# Patient Record
Sex: Male | Born: 1935
Health system: Southern US, Community
[De-identification: ages and names within clinical notes are randomized; demographics above are authoritative.]

## PROBLEM LIST (undated history)

## (undated) DIAGNOSIS — F411 Generalized anxiety disorder: Secondary | ICD-10-CM

## (undated) DIAGNOSIS — I4892 Unspecified atrial flutter: Secondary | ICD-10-CM

## (undated) DIAGNOSIS — E785 Hyperlipidemia, unspecified: Secondary | ICD-10-CM

## (undated) DIAGNOSIS — I4891 Unspecified atrial fibrillation: Secondary | ICD-10-CM

## (undated) DIAGNOSIS — N400 Enlarged prostate without lower urinary tract symptoms: Secondary | ICD-10-CM

## (undated) DIAGNOSIS — I499 Cardiac arrhythmia, unspecified: Secondary | ICD-10-CM

## (undated) DIAGNOSIS — I951 Orthostatic hypotension: Secondary | ICD-10-CM

## (undated) DIAGNOSIS — F09 Unspecified mental disorder due to known physiological condition: Secondary | ICD-10-CM

## (undated) DIAGNOSIS — R55 Syncope and collapse: Secondary | ICD-10-CM

## (undated) DIAGNOSIS — I2510 Atherosclerotic heart disease of native coronary artery without angina pectoris: Secondary | ICD-10-CM

## (undated) DIAGNOSIS — R42 Dizziness and giddiness: Secondary | ICD-10-CM

## (undated) DIAGNOSIS — IMO0002 Reserved for concepts with insufficient information to code with codable children: Secondary | ICD-10-CM

## (undated) HISTORY — PX: APPENDECTOMY: SHX54

## (undated) HISTORY — DX: Cardiac arrhythmia, unspecified: I49.9

## (undated) HISTORY — DX: Unspecified mental disorder due to known physiological condition: F09

## (undated) HISTORY — DX: Syncope and collapse: R55

## (undated) HISTORY — DX: Hyperlipidemia, unspecified: E78.5

## (undated) HISTORY — DX: Benign prostatic hyperplasia without lower urinary tract symptoms: N40.0

## (undated) HISTORY — DX: Unspecified atrial flutter: I48.92

## (undated) HISTORY — DX: Dizziness and giddiness: R42

## (undated) HISTORY — DX: Generalized anxiety disorder: F41.1

## (undated) HISTORY — DX: Unspecified atrial fibrillation: I48.91

## (undated) HISTORY — DX: Atherosclerotic heart disease of native coronary artery without angina pectoris: I25.10

## (undated) HISTORY — DX: Orthostatic hypotension: I95.1

## (undated) HISTORY — PX: OTHER SURGICAL HISTORY: SHX169

## (undated) HISTORY — PX: PULSE GENERATOR IMPLANT: SHX5370

## (undated) HISTORY — DX: Reserved for concepts with insufficient information to code with codable children: IMO0002

---

## 1997-06-17 ENCOUNTER — Ambulatory Visit (HOSPITAL_COMMUNITY): Admission: RE | Admit: 1997-06-17 | Discharge: 1997-06-18 | Payer: Self-pay | Admitting: Cardiovascular Disease

## 1997-06-17 HISTORY — PX: CARDIAC CATHETERIZATION: SHX172

## 1997-06-18 ENCOUNTER — Observation Stay (HOSPITAL_COMMUNITY): Admission: EM | Admit: 1997-06-18 | Discharge: 1997-06-19 | Payer: Self-pay | Admitting: Emergency Medicine

## 1999-03-18 ENCOUNTER — Inpatient Hospital Stay (HOSPITAL_COMMUNITY): Admission: EM | Admit: 1999-03-18 | Discharge: 1999-03-21 | Payer: Self-pay | Admitting: Emergency Medicine

## 1999-03-19 ENCOUNTER — Encounter: Payer: Self-pay | Admitting: Cardiology

## 1999-03-19 ENCOUNTER — Encounter: Payer: Self-pay | Admitting: Cardiovascular Disease

## 1999-03-20 ENCOUNTER — Encounter: Payer: Self-pay | Admitting: Cardiovascular Disease

## 1999-04-12 ENCOUNTER — Emergency Department (HOSPITAL_COMMUNITY): Admission: EM | Admit: 1999-04-12 | Discharge: 1999-04-12 | Payer: Self-pay | Admitting: Emergency Medicine

## 1999-04-12 ENCOUNTER — Encounter: Payer: Self-pay | Admitting: Emergency Medicine

## 1999-04-29 ENCOUNTER — Encounter: Payer: Self-pay | Admitting: Cardiology

## 1999-07-30 ENCOUNTER — Encounter: Payer: Self-pay | Admitting: Cardiology

## 2001-03-13 HISTORY — PX: OTHER SURGICAL HISTORY: SHX169

## 2002-08-12 ENCOUNTER — Encounter: Payer: Self-pay | Admitting: Cardiology

## 2004-08-13 ENCOUNTER — Encounter: Admission: RE | Admit: 2004-08-13 | Discharge: 2004-08-13 | Payer: Self-pay | Admitting: Orthopedic Surgery

## 2004-09-11 ENCOUNTER — Encounter: Admission: RE | Admit: 2004-09-11 | Discharge: 2004-09-11 | Payer: Self-pay | Admitting: Orthopedic Surgery

## 2004-09-15 ENCOUNTER — Encounter: Admission: RE | Admit: 2004-09-15 | Discharge: 2004-09-15 | Payer: Self-pay | Admitting: Orthopedic Surgery

## 2004-10-06 ENCOUNTER — Encounter: Admission: RE | Admit: 2004-10-06 | Discharge: 2004-10-06 | Payer: Self-pay | Admitting: Orthopedic Surgery

## 2005-08-30 ENCOUNTER — Ambulatory Visit (HOSPITAL_COMMUNITY): Admission: RE | Admit: 2005-08-30 | Discharge: 2005-08-30 | Payer: Self-pay | Admitting: *Deleted

## 2005-08-30 ENCOUNTER — Encounter: Payer: Self-pay | Admitting: Cardiology

## 2006-05-31 ENCOUNTER — Encounter: Admission: RE | Admit: 2006-05-31 | Discharge: 2006-05-31 | Payer: Self-pay | Admitting: Orthopedic Surgery

## 2006-06-16 ENCOUNTER — Encounter: Admission: RE | Admit: 2006-06-16 | Discharge: 2006-06-16 | Payer: Self-pay | Admitting: Orthopedic Surgery

## 2006-06-30 ENCOUNTER — Encounter: Admission: RE | Admit: 2006-06-30 | Discharge: 2006-06-30 | Payer: Self-pay | Admitting: Orthopedic Surgery

## 2006-08-03 ENCOUNTER — Encounter: Admission: RE | Admit: 2006-08-03 | Discharge: 2006-08-03 | Payer: Self-pay | Admitting: Orthopedic Surgery

## 2007-02-22 ENCOUNTER — Encounter: Admission: RE | Admit: 2007-02-22 | Discharge: 2007-02-22 | Payer: Self-pay | Admitting: Neurology

## 2007-08-25 ENCOUNTER — Emergency Department (HOSPITAL_COMMUNITY): Admission: EM | Admit: 2007-08-25 | Discharge: 2007-08-25 | Payer: Self-pay | Admitting: Emergency Medicine

## 2008-12-23 ENCOUNTER — Emergency Department (HOSPITAL_COMMUNITY): Admission: EM | Admit: 2008-12-23 | Discharge: 2008-12-23 | Payer: Self-pay | Admitting: Emergency Medicine

## 2009-03-18 HISTORY — PX: OTHER SURGICAL HISTORY: SHX169

## 2009-04-03 ENCOUNTER — Encounter: Payer: Self-pay | Admitting: Cardiology

## 2009-04-03 ENCOUNTER — Ambulatory Visit (HOSPITAL_COMMUNITY): Admission: RE | Admit: 2009-04-03 | Discharge: 2009-04-03 | Payer: Self-pay | Admitting: Ophthalmology

## 2009-05-20 ENCOUNTER — Encounter: Payer: Self-pay | Admitting: Cardiology

## 2009-05-22 ENCOUNTER — Encounter: Payer: Self-pay | Admitting: Cardiology

## 2009-05-23 ENCOUNTER — Telehealth (INDEPENDENT_AMBULATORY_CARE_PROVIDER_SITE_OTHER): Payer: Self-pay | Admitting: *Deleted

## 2009-05-23 ENCOUNTER — Encounter: Payer: Self-pay | Admitting: Cardiology

## 2009-06-03 ENCOUNTER — Encounter: Payer: Self-pay | Admitting: Cardiology

## 2009-06-18 ENCOUNTER — Ambulatory Visit: Payer: Self-pay | Admitting: Cardiology

## 2009-06-18 DIAGNOSIS — E039 Hypothyroidism, unspecified: Secondary | ICD-10-CM | POA: Insufficient documentation

## 2009-06-18 DIAGNOSIS — I951 Orthostatic hypotension: Secondary | ICD-10-CM | POA: Insufficient documentation

## 2009-06-18 DIAGNOSIS — R55 Syncope and collapse: Secondary | ICD-10-CM

## 2009-06-18 DIAGNOSIS — I4891 Unspecified atrial fibrillation: Secondary | ICD-10-CM

## 2009-06-18 DIAGNOSIS — I10 Essential (primary) hypertension: Secondary | ICD-10-CM

## 2009-06-18 DIAGNOSIS — Z950 Presence of cardiac pacemaker: Secondary | ICD-10-CM | POA: Insufficient documentation

## 2009-06-19 ENCOUNTER — Encounter: Payer: Self-pay | Admitting: Cardiology

## 2009-06-23 ENCOUNTER — Encounter: Payer: Self-pay | Admitting: Cardiology

## 2009-06-25 ENCOUNTER — Encounter (INDEPENDENT_AMBULATORY_CARE_PROVIDER_SITE_OTHER): Payer: Self-pay | Admitting: *Deleted

## 2009-07-03 ENCOUNTER — Encounter (INDEPENDENT_AMBULATORY_CARE_PROVIDER_SITE_OTHER): Payer: Self-pay | Admitting: *Deleted

## 2009-07-24 ENCOUNTER — Ambulatory Visit: Payer: Self-pay | Admitting: Cardiology

## 2009-09-01 ENCOUNTER — Encounter: Payer: Self-pay | Admitting: Cardiology

## 2010-02-03 NOTE — Letter (Signed)
Summary: Engineer, materials at [facility]  [address]   [place], [place] [address]   Phone: [phone]  Fax: [phone]        July 03, 2009 MRN: [id number]   Reginald [name] [address] [place], [place]  [address]   Dear [name],  Your test ordered by Selena Batten has been reviewed by your physician (or physician assistant) and was found to be normal or stable. Your physician (or physician assistant) felt no changes were needed at this time.  ____ Echocardiogram  ____ Cardiac Stress Test  __X__ Lab Work - normal urine laboratory values  ____ Peripheral vascular study of arms, legs or neck  ____ CT scan or X-ray  ____ Lung or Breathing test  ____ Other:   Thank you.   Hoover Brunette, LPN    Duane Boston, M.D., F.A.C.C. Thressa Sheller, M.D., F.A.C.C. Oneal Grout, M.D., F.A.C.C. Cheree Ditto, M.D., F.A.C.C. Daiva Nakayama, M.D., F.A.C.C. Kenney Houseman, M.D., F.A.C.C. Jeanne Ivan, PA-C

## 2010-02-03 NOTE — Letter (Signed)
Summary: Appointment- Rescheduled  [facility] at [facility] [address]   [place], [place] [address]   Phone: [phone]  Fax: [phone]     Jun 03, 2009 MRN: [id number]     Reginald [name] [address] [place], [place]  [address]     Dear Mr. DOOLITTLE,   Due to a change in our office schedule, your appointment on   June 17, 2009 at 2 :30 pm must be changed.    Your new appointment will be June 18, 2009 at 1:30pm.   We look forward to participating in your health care needs.        Sincerely,  Glass blower/designer

## 2010-02-03 NOTE — Assessment & Plan Note (Signed)
Summary: [facility]-POST [facility] 5/20   Visit Type:  establisment Primary Provider:  Hasanaj   History of Present Illness: the patient is an 77 year old male with a history of prior pacemaker implantation. The patient presents for evaluation of syncope. Currently around Easter the patient had an episode of syncope. He was evaluated in the emergency room. He was told he was orthostatic. Apparently he passed out in church after prolonged standing. Couple days later he had to go back to the emergency room because of another episode of syncope. A ventilation/perfusion scan was done which showed no evidence for pulmonary emboli. On Thursday the patient another episode of passing out after prolonged standing. Subsequently he went to see a cardiologist at [facility]. Reportedly his pacemaker was checked at that time and no pacemaker malfunction was detected. I presume also that the patient had no recurrence of rapid atrial fibrillation. He is on amiodarone and Coumadin. An EKG with and without magnet confirms normal pacemaker function in the office.  According to patient's wife the patient comes dizzy only after a prolonged period of standing. Orthostatics in the office after several minutes of standing showed no definite orthostatic hypotension. However when the patient coughs or sneezes or when he stands for long time walking church he becomes very dizzy and weak and he seemed to be the circumstances I syncope occurs. There is associated shortness of breath as well as nausea during the episodes. The patient also has been feeling off balance. His wife also reports that he has significant mood swings. His memory is very poor and indeed even short-term memory is affected as the patient could not recall that he mentioned his pacemaker couple of times to me. Her port reported the patient becomes angry and irritable very easily.  Over the last several months the patient has been started on Midrin. Wife  also reports that in the past his been on Florinef. He scheduled to see a neurologist at [facility] next month.  Preventive Screening-Counseling & Management  Alcohol-Tobacco     Smoking Status: quit     Year Quit: 1998  Current Medications (verified): 1)  Nitrostat 0.4 Mg Subl (Nitroglycerin) .... Place Under Tongue For Chest Pain For Up To 3 Times Every . Proceed To Ed If Pain Persisit 2)  Coumadin 5 Mg Tabs (Warfarin Sodium) .... Use As Directed Per Anticoagulative Clinic Was Alanda Amass 3)  Amiodarone Hcl 200 Mg Tabs (Amiodarone Hcl) .... Take 1 Tablet By Mouth Once A Day 4)  Levothyroxine Sodium 100 Mcg Tabs (Levothyroxine Sodium) .... Take 1 Tablet By Mouth Once A Day 5)  Uroxatral 10 Mg Xr24h-Tab (Alfuzosin Hcl) .... Take 1 Tablet By Mouth Once A Day 6)  Crestor 10 Mg Tabs (Rosuvastatin Calcium) .... Take 1 Tablet By Mouth Once A Day 7)  Buspirone Hcl 5 Mg Tabs (Buspirone Hcl) .... Take 1 Tablet By Mouth Two Times A Day 8)  Finasteride 5 Mg Tabs (Finasteride) .... Take 1 Tablet By Mouth Once A Day 9)  Meclizine Hcl 25 Mg Tabs (Meclizine Hcl) .... Take 1 Tablet By Mouth Three Times A Day As Needed 10)  Midodrine Hcl 5 Mg Tabs (Midodrine Hcl) .... Take 1 Tablet By Mouth Two Times A Day 11)  B-100 Complex  Tabs (Vitamins-Lipotropics) .... Take 1 Tablet By Mouth Once A Day 12)  Dulcolax 5 Mg Tbec (Bisacodyl) .... Take 1 Tablet By Mouth Once A Day As Needed Alternate With Colace 13)  Colace 100 Mg Caps (Docusate Sodium) .Marland KitchenMarland KitchenMarland Kitchen  Take 2 Tablet By Mouth Once A Day As Needed Alternate With Dulcolax  Allergies (verified): No Known Drug Allergies  Comments:  Nurse/Medical Assistant: The patient's medication list and allergies were reviewed with the patient and were updated in the Medication and Allergy Lists.  Past History:  Past Medical History: Last updated: 06/18/2009 Atrial Fibrillation Hyperlipidemia Syncope Vertigo BPH Anxiety neurosis  Past Surgical History: Last  updated: 06/18/2009 Appendectomy  Ex-plant of a Medtronic Kappa [id number] generator, serial number      [id number].  Atrial and ventricular lead interrogation.  Implant of a Medtronic EnRhythm generator, model number [id number], serial      number [id number].  Family History: Last updated: 06/18/2009 Father: CHF Mother: had breast cancer Sister: had breast cancer  Social History: Last updated: 06/18/2009 Retired  Married  Alcohol Use - no Drug Use - no  Risk Factors: Smoking Status: quit (06/18/2009)  Social History: Smoking Status:  quit  Review of Systems       The patient complains of shortness of breath, nausea, muscle weakness, fainting, and dizziness.  The patient denies fatigue, malaise, fever, weight gain/loss, vision loss, decreased hearing, hoarseness, chest pain, palpitations, prolonged cough, wheezing, sleep apnea, coughing up blood, abdominal pain, blood in stool, vomiting, diarrhea, heartburn, incontinence, blood in urine, joint pain, leg swelling, rash, skin lesions, headache, depression, anxiety, enlarged lymph nodes, easy bruising or bleeding, and environmental allergies.    Vital Signs:  Patient profile:   77 year old male Height:      68 inches Weight:      168 pounds BMI:     25.64 Pulse rate:   73 / minute Pulse (ortho):   73 / minute BP sitting:   101 / 67  (left arm) BP standing:   91 / 57 Cuff size:   regular  Vitals Entered By: Carlye Grippe (June 18, 2009 1:29 PM)  Nutrition Counseling: Patient's BMI is greater than 25 and therefore counseled on weight management options.  Serial Vital Signs/Assessments:  Time      Position  BP       Pulse  Resp  Temp     By 2:15 PM   Lying LA  103/69   78                    Lydia Anderson 2:15 PM   Sitting   98/64    70                    Carlye Grippe 2:15 PM   Standing  91/57    73                    Carlye Grippe  Comments: 2 min Standing BP 102/67 HR 71 5 min Standing BP 99/65 HR 70 By: Cyril Loosen, RN, BSN    Physical Exam  Additional Exam:  General: Well-developed, well-nourished in no distress head: Normocephalic and atraumatic eyes PERRLA/EOMI intact, conjunctiva and lids normal nose: No deformity or lesions mouth normal dentition, normal posterior pharynx neck: Supple, no JVD.  No masses, thyromegaly or abnormal cervical nodes lungs: Normal breath sounds bilaterally without wheezing.  Normal percussion heart: regular rate and rhythm with normal S1 and S2, no S3 or S4.  PMI is normal.  No pathological murmurs abdomen: Normal bowel sounds, abdomen is soft and nontender without masses, organomegaly or hernias noted.  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 He has multiple neurological complaints are consistent with a syndrome. He has some Parkinsonian like features. We will check an a.m. cortisol, aldosterone and norepinephrine level. I do not think we need to check a dopamine beta hydroxylase level. 24 hour urine for catecholamines will be tested. Brain imaging can be deferred to neurology and the patient is not a candidate for MRI anyway because of his pacemaker. I asked the patient to increase his Midrin to 10 mg p.o. b.i.d. and after all the blood work and urine samples have been obtained he can start on Florinef 0.1 mg p.o. q. daily. Orders: T-Basic Metabolic Panel [phone]) T- * Misc. Laboratory test  [phone]) T- * Misc. Laboratory test [phone]) T- * Misc. Laboratory test [phone]) T- * Misc. Laboratory test [phone])  Problem # 2:  ATRIAL FIBRILLATION (ICD-427.31) I doubt the patient hasrecurrence of atrial fibrillation.he will be continued on amiodarone and Coumadin. However a TSH level will be obtained. It is possible that amiodarone is contributing to gait instability, but we will wait for lab results before making a decision to discontinue this. We'll also wait for formal neurology evaluation. TSH does need to be checked as hypothyroidism could be contributing to the patient's memory loss. The following medications were removed from the medication list:    Adult Aspirin Ec Low Strength 81 Mg Tbec (Aspirin) .Marland Kitchen... Take 1 tablet by mouth once a day His updated medication list for this problem includes:    Coumadin 5 Mg Tabs (Warfarin sodium) ..... Use as directed per anticoagulative clinic was weintraub    Amiodarone Hcl 200 Mg Tabs (Amiodarone hcl) .Marland Kitchen... Take 1 tablet by mouth once a day  Orders: EKG w/ Interpretation ([address]) T-Basic Metabolic Panel ([id number]) T- * Misc. Laboratory test [id number]) T- * Misc. Laboratory test [phone]) T- * Misc. Laboratory test [phone]) T- * Misc. Laboratory test [phone]) T-TSH [phone])  The following medications were removed from the medication list:    Adult Aspirin Ec Low Strength 81 Mg Tbec (Aspirin) .Marland Kitchen... Take 1 tablet by mouth once a day His updated medication list for this problem includes:    Coumadin 5 Mg Tabs (Warfarin sodium) ..... Use as directed per anticoagulative clinic was weintraub    Amiodarone Hcl 200 Mg Tabs (Amiodarone hcl) .Marland Kitchen... Take 1 tablet by mouth once a day  Problem # 3:  CARDIAC PACEMAKER IN SITU (ICD-V45.01) no apparent malfunction of pacemaker. The pacemaker was interrogated the time since January. His EKG with and without magnet also shows normal pacemaker function  Patient Instructions: 1)  Increase Midodrine to 10mg  by  mouth two times a day. You may take 2 of your 5 mg tablets in the am and 2 in the pm until gone. Then get new prescription filled for 10mg  tablets and take 1 tablet two times a day. 2)  Your physician recommends that you go to the [facility] for lab work. Do not eat or drink after midnight. This includes a 24 hour urine. Do not eat or drink after midnight.  3)  Start Florinef 0.1mg  by mouth once daily. DO NOT START THIS MEDICATION UNTIL YOU HAVE LAB WORK AND 24 HOUR URINE DONE. 4)  FOLLOW UP WITH DR. DE GENT ON THURSDAY, July 24, 2009 AT 2PM. Prescriptions: FLUDROCORTISONE ACETATE 0.1 MG TABS (FLUDROCORTISONE ACETATE) Take 1 tablet by mouth once a day  #30 x 3   Entered by:   Cyril Loosen, RN, BSN   Authorized by:   Lewayne Bunting, MD, [facility]   Signed by:   Cyril Loosen, RN, BSN on 06/18/2009   Method used:   Electronically to        [facility]* (retail)       [address]       [place], [place]  [address]       Ph: [phone]       Fax: [phone]   RxID:   [id number]   Handout requested. MIDODRINE HCL 10 MG TABS (MIDODRINE HCL) Take 1 tablet by mouth two times a day  #60 x 6   Entered by:   Cyril Loosen, RN, BSN   Authorized by:   Lewayne Bunting, MD, [facility]   Signed by:   Cyril Loosen, RN, BSN on 06/18/2009   Method used:   Electronically to        [facility]* (retail)       [address]       [place], [place]  [address]       Ph: [phone]       Fax: [phone]   RxID:   [id number]

## 2010-02-03 NOTE — Letter (Signed)
Summary: Discharge Summary  Discharge Summary   Imported By: Dorise Hiss 06/18/2009 09:49:07  _____________________________________________________________________  External Attachment:    Type:   Image     Comment:   External Document

## 2010-02-03 NOTE — Progress Notes (Signed)
Summary: syncope  Phone Note Other Incoming   Caller: wife  Summary of Call: States pt. just came home from hospiital this a.m.  Thought that Dr. Diona Browner was going to come back to see him, but didn't so they went ahead and came home.  Discharged by Dr. Olena Leatherwood.  C/O still having issues of feeling like he is going to pass out.  States BP is 89/42  HR 70.  States his bp has been doing this, going up and down.  Also, advised her that our MD is still rounding on pt's over there and that it probably would have been good if they had stayed and waited.  Advised her that if he is still feeling like this, then he should come back to hosp. for further evaluation.  Wife verbalized understanding.   Initial call taken by: Hoover Brunette, LPN,  May 23, 2009 3:21 PM

## 2010-02-03 NOTE — Op Note (Signed)
Summary: Operative Report/ PACEMAKER IMPLANT  Operative Report/ PACEMAKER IMPLANT   Imported By: Dorise Hiss 06/18/2009 09:56:40  _____________________________________________________________________  External Attachment:    Type:   Image     Comment:   External Document

## 2010-02-03 NOTE — Letter (Signed)
Summary: External Correspondence/ [facility]  External Correspondence/ [facility]   Imported By: Dorise Hiss 09/18/2009 17:05:39  _____________________________________________________________________  External Attachment:    Type:   Image     Comment:   External Document

## 2010-02-03 NOTE — Assessment & Plan Note (Signed)
Summary: 1 MON F/U PER 6/15 OV-JM   Visit Type:  Follow-up Primary Provider:  Hasanaj   History of Present Illness: the patient is an 77 year old male who previously presented for evaluation of syncope.he has history of proximal atrial fibrillation and is on amiodarone therapy, is also status post pacemaker implantation and is maintaining normal sinus rhythm.. The patient was orthostatic in the office last visit. He demonstrated some Parkinsonian features are those concerned about Shy-Drager syndrome. I started him on Florinef and Midrin. He has had no further syncopal episodes. He does have occasional vertigo which is quite different and is resolved with meclizine. He has a formal evaluation scheduled with neurology. The patient also complains of memory loss. He has been evaluated with 24-hour urine cortisol, aldosterone and norepinephrine and epinephrine levels. Studies were within normal limits.X.  The patient does demonstrate some paranoid behavior in particular towards the government. He has some compulsive traits like checking behavior. He feels somewhat unsteady on his feet at times.  Preventive Screening-Counseling & Management  Alcohol-Tobacco     Smoking Status: quit     Year Quit: 1996  Current Medications (verified): 1)  Nitrostat 0.4 Mg Subl (Nitroglycerin) .... Place Under Tongue For Chest Pain For Up To 3 Times Every . Proceed To Ed If Pain Persisit 2)  Coumadin 5 Mg Tabs (Warfarin Sodium) .... Use As Directed Per Anticoagulative Clinic Was Alanda Amass 3)  Amiodarone Hcl 200 Mg Tabs (Amiodarone Hcl) .... Take 1/2 Tab (100mg ) Daily 4)  Levothyroxine Sodium 100 Mcg Tabs (Levothyroxine Sodium) .... Take 1 Tablet By Mouth Once A Day 5)  Uroxatral 10 Mg Xr24h-Tab (Alfuzosin Hcl) .... Take 1 Tablet By Mouth Once A Day 6)  Crestor 10 Mg Tabs (Rosuvastatin Calcium) .... Take 1 Tablet By Mouth Once A Day 7)  Buspirone Hcl 5 Mg Tabs (Buspirone Hcl) .... Take 1 Tablet By Mouth Two  Times A Day 8)  Finasteride 5 Mg Tabs (Finasteride) .... Take 1 Tablet By Mouth Once A Day 9)  Meclizine Hcl 25 Mg Tabs (Meclizine Hcl) .... Take 1 Tablet By Mouth Three Times A Day As Needed 10)  Midodrine Hcl 10 Mg Tabs (Midodrine Hcl) .... Take 1 Tablet By Mouth Two Times A Day 11)  B-100 Complex  Tabs (Vitamins-Lipotropics) .... Take 1 Tablet By Mouth Once A Day 12)  Dulcolax 5 Mg Tbec (Bisacodyl) .... Take 1 Tablet By Mouth Once A Day As Needed Alternate With Colace 13)  Colace 100 Mg Caps (Docusate Sodium) .... Take 2 Tablet By Mouth Once A Day As Needed Alternate With Dulcolax 14)  Fludrocortisone Acetate 0.1 Mg Tabs (Fludrocortisone Acetate) .... Take 1 Tablet By Mouth Once A Day  Allergies (verified): No Known Drug Allergies  Comments:  Nurse/Medical Assistant: The patient's medication list and allergies were reviewed with the patient and were updated in the Medication and Allergy Lists.  Past History:  Past Medical History: Last updated: 06/18/2009 Atrial Fibrillation Hyperlipidemia Syncope Vertigo BPH Anxiety neurosis  Past Surgical History: Last updated: 06/18/2009 Appendectomy  Ex-plant of a Medtronic Kappa [id number] generator, serial number      [id number].  Atrial and ventricular lead interrogation.  Implant of a Medtronic EnRhythm generator, model number [id number], serial      number [id number].  Family History: Last updated: 06/18/2009 Father: CHF Mother: had breast cancer Sister: had breast cancer  Social History: Last updated: 06/18/2009 Retired  Married  Alcohol Use - no Drug Use - no  Risk Factors: Smoking Status: quit (07/24/2009)  Review of Systems  The patient denies fatigue, malaise, fever, weight gain/loss, vision loss, decreased hearing, hoarseness, chest pain, palpitations, shortness of breath, prolonged cough, wheezing, sleep apnea, coughing up blood, abdominal pain, blood in stool, nausea, vomiting, diarrhea, heartburn, incontinence,  blood in urine, muscle weakness, joint pain, leg swelling, rash, skin lesions, headache, fainting, dizziness, depression, anxiety, enlarged lymph nodes, easy bruising or bleeding, and environmental allergies.    Vital Signs:  Patient profile:   77 year old male Height:      68 inches Weight:      167 pounds Pulse rate:   75 / minute BP sitting:   111 / 75  (left arm) Cuff size:   regular  Vitals Entered By: Carlye Grippe (July 24, 2009 2:17 PM)  Physical Exam  Additional Exam:  General: Well-developed, well-nourished in no distress head: Normocephalic and atraumatic eyes PERRLA/EOMI intact, conjunctiva and lids normal nose: No deformity or lesions mouth normal dentition, normal posterior pharynx neck: Supple, no JVD.  No masses, thyromegaly or abnormal cervical nodes lungs: Normal breath sounds bilaterally without wheezing.  Normal percussion heart: regular rate and rhythm with normal S1 and S2, no S3 or S4.  PMI is normal.  No pathological murmurs abdomen: Normal bowel sounds, abdomen is soft and nontender without masses, organomegaly or hernias noted.  No hepatosplenomegaly musculoskeletal: Back normal, normal gait muscle strength and tone normal pulsus: Pulse is normal in all 4 extremities Extremities: No peripheral pitting edema neurologic: Alert and oriented x 3 skin: Intact without lesions or rashes cervical nodes: No significant adenopathy psychologic: Normal affect    Impression & Recommendations:  Problem # 1:  CARDIAC PACEMAKER IN SITU (ICD-V45.01)  Problem # 2:  ATRIAL FIBRILLATION (ICD-427.31) decrease amiodarone to 100 mg p.o. q. day His updated medication list for this problem includes:    Coumadin 5 Mg Tabs (Warfarin sodium) ..... Use as directed per anticoagulative clinic was weintraub    Amiodarone Hcl 200 Mg Tabs (Amiodarone hcl) .Marland Kitchen... Take 1/2 tab (100mg ) daily  Orders: EKG w/ Interpretation (93[address])  Problem # 3:  SYNCOPE AND COLLAPSE  (ICD-780.2) much improved with Florinef and Midrin. The patient will have a formal neurologic evaluation in particularly evaluation for Shy-Drager syndrome. His updated medication list for this problem includes:    Nitrostat 0.4 Mg Subl (Nitroglycerin) .Marland Kitchen... Place under tongue for chest pain for up to 3 times every . proceed to ed if pain persisit    Coumadin 5 Mg Tabs (Warfarin sodium) ..... Use as directed per anticoagulative clinic was weintraub    Amiodarone Hcl 200 Mg Tabs (Amiodarone hcl) .Marland Kitchen... Take 1/2 tab (100mg ) daily  Patient Instructions: 1)  Decrease Amiodarone to 100mg  daily  2)  Follow up in  6 months

## 2010-02-03 NOTE — Letter (Signed)
Summary: Engineer, materials at [facility]  [address]   [place], [place] [address]   Phone: [phone]  Fax: [phone]        June 25, 2009 MRN: [id number]   Reginald [name] [address] [place], [place]  [address]   Dear [name],  Your test ordered by Selena Batten has been reviewed by your physician (or physician assistant) and was found to be normal or stable. Your physician (or physician assistant) felt no changes were needed at this time.  ____ Echocardiogram  ____ Cardiac Stress Test  _X__ Lab Work  ____ Peripheral vascular study of arms, legs or neck  ____ CT scan or X-ray  ____ Lung or Breathing test  ____ Other:   Thank you.   Hoover Brunette, LPN    Duane Boston, M.D., F.A.C.C. Thressa Sheller, M.D., F.A.C.C. Oneal Grout, M.D., F.A.C.C. Cheree Ditto, M.D., F.A.C.C. Daiva Nakayama, M.D., F.A.C.C. Kenney Houseman, M.D., F.A.C.C. Jeanne Ivan, PA-C

## 2010-02-03 NOTE — Op Note (Signed)
Summary: Operative Report  Operative Report   Imported By: Dorise Hiss 06/18/2009 09:47:24  _____________________________________________________________________  External Attachment:    Type:   Image     Comment:   External Document

## 2010-03-30 LAB — BASIC METABOLIC PANEL
Creatinine, Ser: 1.25 mg/dL (ref 0.4–1.5)
GFR calc Af Amer: >60 mL/min (ref >60)

## 2010-03-30 LAB — HEMOGLOBIN AND HEMATOCRIT, BLOOD: HCT: 31.3 % — ABNORMAL LOW (ref 39.0–52.0)

## 2010-04-03 ENCOUNTER — Ambulatory Visit (INDEPENDENT_AMBULATORY_CARE_PROVIDER_SITE_OTHER): Payer: Medicare Other | Admitting: Urology

## 2010-04-03 DIAGNOSIS — R310 Gross hematuria: Secondary | ICD-10-CM

## 2010-04-03 DIAGNOSIS — N411 Chronic prostatitis: Secondary | ICD-10-CM

## 2010-04-03 DIAGNOSIS — N138 Other obstructive and reflux uropathy: Secondary | ICD-10-CM

## 2010-04-06 LAB — COMPREHENSIVE METABOLIC PANEL
ALT: 25 U/L (ref 0–53)
AST: 35 U/L (ref 0–37)
Alkaline Phosphatase: 54 U/L (ref 39–117)
BUN: 12 mg/dL (ref 6–23)
CO2: 26 mEq/L (ref 19–32)
Chloride: 105 mEq/L (ref 96–112)
GFR calc non Af Amer: 43 mL/min — ABNORMAL LOW (ref >60)
Glucose, Bld: 95 mg/dL (ref 70–99)

## 2010-04-06 LAB — DIFFERENTIAL
Eosinophils Absolute: 0 10*3/uL (ref 0.0–0.7)
Eosinophils Relative: 1 % (ref 0–5)
Monocytes Absolute: 0.3 10*3/uL (ref 0.1–1.0)
Monocytes Relative: 7 % (ref 3–12)
Neutrophils Relative %: 58 % (ref 43–77)

## 2010-04-06 LAB — APTT: aPTT: 44 seconds — ABNORMAL HIGH (ref 24–37)

## 2010-04-06 LAB — CBC
Hemoglobin: 10.9 g/dL — ABNORMAL LOW (ref 13.0–17.0)
MCHC: 35.3 g/dL (ref 30.0–36.0)
MCV: 89.9 fL (ref 78.0–100.0)
WBC: 5.1 10*3/uL (ref 4.0–10.5)

## 2010-04-06 LAB — URINALYSIS, ROUTINE W REFLEX MICROSCOPIC
Bilirubin Urine: NEGATIVE
Glucose, UA: NEGATIVE mg/dL
Hgb urine dipstick: NEGATIVE
Ketones, ur: NEGATIVE mg/dL
Nitrite: NEGATIVE
Urobilinogen, UA: 2 mg/dL — ABNORMAL HIGH (ref 0.0–1.0)

## 2010-04-06 LAB — URINE CULTURE: Culture: NO GROWTH

## 2010-04-06 LAB — PROTIME-INR
INR: 3.47 — ABNORMAL HIGH (ref 0.00–1.49)
Prothrombin Time: 34.6 seconds — ABNORMAL HIGH (ref 11.6–15.2)

## 2010-04-06 LAB — URINE MICROSCOPIC-ADD ON

## 2010-05-22 NOTE — Procedures (Signed)
[place]. [facility]  Patient:    Reginald [name], Reginald [name]                        MRN: [id number] Proc. Date: 03/19/99 Adm. Date:  [id number] Attending:  Ruta Hinds CC:         Richard A. Alanda Amass, M.D.             The Record Room             The [facility]             Armanda Magic, M.D.             Dr. Camelia Eng; [place] [facility]                           Procedure Report  PROCEDURE:  Implantation of permanent DDDR mode switching, rate responsive, A-V  Universal generator with passive fixation atrial and ventricular electrodes.  PULSE GENERATOR:  Medtronic Cappa KDR 701; SNPGU [id number].  VENTRICULAR ELECTRODE:  Silicone in-line coaxial bipolar Medtronic [id number]; SN: [id number].  ATRIAL ELECTRODE:  Silicone-polyurethane IS1 connector in-line coaxial bipolar Medtronic [id number]; SN: [id number].  IMPLANTING PHYSICIAN:  Richard A. Alanda Amass, M.D.  COMPLICATIONS:  None.  ESTIMATED BLOOD LOSS:  Approximately 30 cc.  ANESTHESIA:  5 mg Valium p.o. premedication, 4 mg Nubain IV in divided doses for sedation, and 1% local Xylocaine.  PREOPERATIVE DIAGNOSES: 1. Recurrent near syncope. 2. Documented sinus arrest with 7 s pauses. 3. Paroxysmal atrial fibrillation documented, sick sinus syndrome. 4. Noncritical coronary artery disease on prior catheterization on June 19, 1997    with normal LV function. 5. Gastroesophageal reflux disease. 6. Hyperlipidemia.  POSTOPERATIVE DIAGNOSES: 1. Recurrent near syncope. 2. Documented sinus arrest with 7 s pauses. 3. Paroxysmal atrial fibrillation documented, sick sinus syndrome. 4. Noncritical coronary artery disease on prior catheterization on June 19, 1997    with normal LV function. 5. Gastroesophageal reflux disease. 6. Hyperlipidemia.  DESCRIPTION OF PROCEDURE:  The patient was brought to the second floor CP lab in a postabsorptive state after premedication with 5 mg of Valium p.o.   Heparin was n hold for greater than eight hours prior to the procedure.  The patient had been on aspirin up until the day of the procedure.  He was premedicated with 5 mg of Valium.  1% Xylocaine was used for local anesthesia.  The left anterior chest was prepped and draped in the usual manner.  1% Xylocaine was used to anesthetize the skin and subcutaneous tissues.  A left infraclavicular curvilinear transverse incision was performed and brought down to the prepectoral fascia using blunt dissection.  Electrocautery to control hemostasis.  A pulse generator pocket was formed using blunt dissection.  The subclavian vein was entered with a single anterior puncture using an 18 thin-walled needle and two #9 peel-away Lamagna introducers were used to introduce the atrial and ventricular leads in tandem along with a stainless steal J-tipped guide wire, which was kept in place until the leads were positioned.  Threshold testing was performed, and then removed.  The ventricular electrode was inserted into the RV apex in stable position.  The atrial electrode into the right atrial appendage.  Confirmed by fluoroscopy and rotational maneuvers and was stable.  The electrodes were secured at the insertion site where the previously placed #1 figure-of-eight silk suture, and was further secured with two interrupted #1  silk sutures around a silicone ______ collar for each electrode.  There was considerable oozing in the pocket, which was controlled with electrocautery, and the pocket was dry at the end of the procedure.  Threshold testing was performed, along with retrograde conduction studies through the implanted electrodes.  The generator conformed to manufacturers specifications n PSA testing with 3.6 V atrial and 4.1 V ventricular output.  The sponge count was correct.  The generator was hooked to the electrodes in the proper sequence, looped behind, and delivered into the pocket.  The  generator was loosely secured to the underlying muscle and fascia with a #1 silk suture to prevent migration.  The subcutaneous tissue was closed with two separate running layers of 2-0 Dexon suture, and the skin was closed with 5-0 subcuticular Dexon suture. Steri-Strips were applied.  Fluoroscopy showed good position of the atrial and ventricular electrodes.  There was no pneumothorax present.  The patient tolerated the procedure well and was transferred to the holding area for postoperative programming in stable condition.  On PSA testing at 10 V output; Unipolar, bipolar, atrial, and ventricle, there as no diaphragmatic or esophageal stimulation.  Ventricular pacing and atrial recording showed there was no retrograde V-A conduction at a pacing rate of 80-90 with retrograde 2:1 block.  There was prolonged ______ with of almost 1000 ms with simple atrial pacing at rates of 70-80.  THRESHOLDS: 1. Atrial unipolar: P = 2.1 mV, resistance 413 ohms, minimal threshold 0.5 V. 2. Unipolar ventricle: R = 8.8 mV, resistance 535 ohms, minimal threshold 0.2 V. 3. Atrial/bipolar: P = 4.9 mV, resistance 509 ohms, minimal threshold 0.6 V. 4. Ventricular/bipolar: R = 8.0 mV, resistance 668 ohms, minimal threshold 0.3 . 5. Magna rated BOL = 85. 6. Magna rated EOL = 65/min (RRT) with reversion of VVI mode.  The patients pacemaker has rate responsive activity pacing, A-V hysteresis, and  mode switching capabilities. DD:  03/19/99 TD:  03/19/99 Job: [id number]

## 2010-05-22 NOTE — Discharge Summary (Signed)
[place]. [facility]  Patient:    Reginald [name], Reginald [name]                        MRN: [id number] Adm. Date:  [id number] Disc. Date: [id number] Attending:  Otilio Connors Iv Dictator:   [facility] [place], [place].A.                           Discharge Summary  ADMISSION DIAGNOSES: 1. Recurrent near syncope. 2. Sick sinus syndrome, paroxysmal atrial fibrillation. 3. Intermittent sinus bradycardia, junctional rhythm, and marked pauses with    ventricular asystole symptomatic. 4. Hyperlipidemia. 5. Noncritical coronary disease on catheterization June 17, 1997, with normal    left ventricular function. 6. Gastroesophageal reflux disease.  DISCHARGE DIAGNOSES: 1. Recurrent near syncope. 2. Sick sinus syndrome, paroxysmal atrial fibrillation. 3. Intermittent sinus bradycardia, junctional rhythm, and marked pauses with    ventricular asystole symptomatic. 4. Hyperlipidemia. 5. Noncritical coronary disease on catheterization June 17, 1997, with normal    left ventricular function. 6. Gastroesophageal reflux disease. 7. Status post permanent transvenous pacemaker implantation on March 19, 1999,    by Richard A. Alanda Amass, M.D. with a DDDR pacer implanted.  HISTORY OF PRESENT ILLNESS:  [name] is a 77 year old male who presented to the office to see Richard A. Alanda Amass, M.D. on March 14. At that time, he had had multiple episodes of presyncope over the last four months. He had approximately 10 episodes. They were all characterized by a warm feeling in his head and neck which progressed to his back and then a feeling of lightheadedness and near syncope. Fortunately, he had not had any falls, auto accidents, or frank syncope. His last episode had been about a week ago. However, on the day of his office visit while with his wife, he developed an episode of presyncope. He had some sweats with nausea with no diaphoresis or vomiting and no chest pain. His wife came to the  office today and accompanied him and asked that he be seen. In the office, he had episodes of short-lived atrial fibrillation followed by sinus pauses up to 7 seconds. He also had episodic junctional rhythm. His basic underlying EKG showed atrial fibrillation with variable ventricular response that was paroxysmal. He also had sinus rhythm with RSR V1, normal QRS, and no significant ST-T change with intermittent junctional rhythm.  At that time, the patient was planned for admission to the hospital for intensive cardiac monitoring. It was felt that he may require temporary pacing if he continued to have long episodes of ventricular asystole that was symptomatic. He would probably be better in atrial fibrillation in that regard. At that time, Richard A. Alanda Amass, M.D. held his beta blockers and planned to check appropriate labs. He planned that the patient would require permanent transvenous pacing and then medical therapy of the sick sinus syndrome. There was no history to suggest a predominantly vasodepressor reaction. It seemed quite clear from available strips that his symptoms were related to his bradycardia and pauses. The procedure and risks of permanent pacemaker implantation were fully explained to the patient and his wife. They were agreeable to proceed with that plan.  HOSPITAL COURSE:  On March 15, [name] underwent permanent transvenous pacemaker implantation by Richard A. Alanda Amass, M.D. Please see his dictated report for further detail. There was implantation of a permanent DDDR mode switching, rate-responsive AV universal generator with ______  fixation, atrial  and ventricular electrodes. The pulse generator was a Medtronic ______ [id number], serial number [id number]. The ventricular electrode is a silicon in-line coaxial bipolar Medtronic [id number]. Serial number is [id number]. The atrial electrode is a silicon polyurethane IS1 connector in-line coaxial bipolar Medtronic  [id number]. Serial number is [id number].  On March 16, [name] stated that he had had some nausea when he was taken to radiology for his chest x-ray that morning. Otherwise, he had no other complaints. His pacer site was clean with no signs of drainage or hematoma and no sign of infection. We would plan to ambulate him throughout the day and plan for discharge home the following morning if stable.  On March 17, [name] pacer site looked good. His exam was essentially benign with no cardiac rub. His lungs were clear. He was pacing appropriately. His post-pacemaker chest x-ray was okay. He was then planned to restart his aspirin on the following Monday and was stable for discharge home at this time. At this time, he was hemodynamically stable with a blood pressure of 116/68, pulse 70, and temperature is 97. Telemetry revealed AV pacing in the 70s.  HOSPITAL CONSULTATIONS:  None.  HOSPITAL PROCEDURES:  Implantation of the permanent transvenous pacemaker on March 19, 1999, by Richard A. Alanda Amass, M.D. This is an implantation of a permanent DDDR mode switching, rate-responsive AV universal generator with ______  fixation atrial and ventricular electrodes.  HOSPITAL LABORATORY DATA:  TSA normal at 1.84. Thyroid reveals TSH 3.643. T3 38.9, T4 5.8. Lipid profile revealed total cholesterol 176, triglyceride 314, HDL 26, LDL 87. Metabolic profile was normal, as well as LFT. CBC was also normal.  Chest x-ray on March 15 shows cardiomegaly with no congestive heart failure infiltrate. Chest x-ray on March 16 shows satisfactory position of left-sided cardiac pacemaker. No pneumothorax or other acute findings.  EKG on March 14 revealed sinus bradycardia, 54 beats per minute.  DISCHARGE MEDICATIONS: 1. Cardizem CD 180 mg once a day. 2. Vitamin E 400 IU once a day. 3. Zocor 10 mg once a day. 4. Nitroglycerin sublingual p.r.n. for chest pain. 5. Prevacid 15 mg once a day. 6. Niaspan 500 mg  once a day. 7. Enteric-coated aspirin 81 mg once a day, to be started on Monday, the 19th.  INSTRUCTIONS:  No driving until follow up with the doctor. No lifting the arm  above the head. No stretching or lifting with that arm. As well, they were to not wash the incision site area for four days, then they may use soap and water but pat dry only. They were to not remove the steri-strips. They are to call the office if there is any severe bruising, bleeding, pain. They were to call the office on Monday at [phone] to set up an appointment to see Richard A. Alanda Amass, M.D. in seven to ten days. DD:  04/29/99 TD:  04/29/99 Job: [id number]

## 2010-05-22 NOTE — Op Note (Addendum)
Reginald [name], COIN                 ACCOUNT NO.:  [id number]   MEDICAL RECORD NO.:  [id number]          PATIENT TYPE:  OIB   LOCATION:  2899                         FACILITY:  [facility]   PHYSICIAN:  Darlin Priestly, MD  DATE OF BIRTH:  May 30, 1924   DATE OF PROCEDURE:  08/30/2005  DATE OF DISCHARGE:                                 OPERATIVE REPORT   PROCEDURES:  1. Ex-plant of a Medtronic Kappa [id number] generator, serial number      [id number].  2. Atrial and ventricular lead interrogation.  3. Implant of a Medtronic EnRhythm generator, model number P1501DR, serial      number [id number].   ATTENDING SURGEON:  Darlin Priestly, MD   COMPLICATIONS:  None.   INDICATIONS:  Mr. Goens is a 77 year old male patient of Dr. Susa Griffins, Dr. Olena Leatherwood, with a history of paroxysmal atrial fibrillation,  orthostatic hypotension, neurocardiogenic syncope, history of non critical  CAD with normal EF in January of 2005, hyperlipidemia, hypothyroidism,  history of dual-chamber pacer implant on March 19, 1999, using a Medtronic  Kappa [id number] generator with passive atrial and ventricular leads.  His  ventricular lead is a Medtronic model number [id number], serial number [id number].  His atrial lead is a Medtronic model number [id number], serial number [id number].  He has recently reverted to ERI and has basically developed pacer syndrome  secondary to his VVI pacing.  He is now referred for generator change out.   DESCRIPTION OF PROCEDURE:  After informed consent, patient was brought to  the cardiac cath lab where the left chest was shaved, prepped and draped in  the usual sterile fashion.  ____ QA MARKER: 143 ____ monit  Then 1% lidocaine was used to anesthetize just inferior to the previous  incision and over his current generator.  Next, an approximately 3-cm  horizontal incision was carried out and hemostasis was obtained with  electrocautery.  Blunt dissection then used to carry this down to the  pacer  capsule.  The capsule was then opened with a scalpel and was freed from the  capsule.  The generator was then delivered from the pocket.  As previously  stated, this was a Medtronic Kappa [id number], serial number [id number].  The  leads were inspected and appeared to be in excellent condition.  The head  screw was then loosened on the atrial lead and the atrial lead was then  removed from the generator.  The atrial lead was model number 4592, serial  number [id number].  This lead was then interrogated.  P waves were measured  at 3.8 millivolts.  Threshold in the atrial lead was 0.4 volts as 0.5  milliseconds.  Impedance was 433 ohms.  Current was 1.1 milliamp.  The  ventricular head screw was then loosened and the ventricular lead was then  removed from the generator and the generator was delivered from the field.  This lead again appeared to be in excellent condition.  The RV lead was  model number [id number], serial number [id number].  This lead was interrogated.  The R waves were measured at 6.6 millivolts.  Threshold in the ventricle was  0.8 volts at 0.5 milliseconds.  Impedance of 553 ohms.  Current was 1.7  milliamps.   The pocket was then copiously irrigated with 1% clindamycin solution.  Single silk suture was then placed in the apex of the pocket.  The leads  were then connected in a serial fashion to Medtronic EnRhythm generator,  model number [id number], serial number [id number].  Head screws were tightened,  and pacing was confirmed.  The generator was then delivered back into the  pocket and the header was secured with silk suture.  The subcutaneous layer  was then closed with running 2-0 Vicryl.  The skin was then closed with  running 4-0 Vicryl.  Steri-Strips were applied.  The patient was returned  back to the recovery room in stable condition.   CONCLUSION:  1. Successful ex-plant of a Medtronic Kappa [id number], serial number      [id number].  2. Atrial and ventricular lead  interrogation.  3. Successful implant of a Medtronic EnRhythm [id number] generator, serial      number [id number].      Darlin Priestly, MD  Electronically Signed     RHM/MEDQ  D:  08/30/2005  T:  08/30/2005  Job:  [id number]   cc:   Gerlene Burdock A. Alanda Amass, M.D.  Jeananne Rama ****

## 2010-07-10 ENCOUNTER — Ambulatory Visit: Payer: Medicare Other | Admitting: Urology

## 2010-07-31 ENCOUNTER — Ambulatory Visit (INDEPENDENT_AMBULATORY_CARE_PROVIDER_SITE_OTHER): Payer: Medicare Other | Admitting: Urology

## 2010-07-31 DIAGNOSIS — R351 Nocturia: Secondary | ICD-10-CM

## 2010-07-31 DIAGNOSIS — N529 Male erectile dysfunction, unspecified: Secondary | ICD-10-CM

## 2010-07-31 DIAGNOSIS — N138 Other obstructive and reflux uropathy: Secondary | ICD-10-CM

## 2010-07-31 DIAGNOSIS — N411 Chronic prostatitis: Secondary | ICD-10-CM

## 2010-10-30 ENCOUNTER — Ambulatory Visit (INDEPENDENT_AMBULATORY_CARE_PROVIDER_SITE_OTHER): Payer: Medicare Other | Admitting: Urology

## 2010-10-30 DIAGNOSIS — R351 Nocturia: Secondary | ICD-10-CM

## 2010-10-30 DIAGNOSIS — N138 Other obstructive and reflux uropathy: Secondary | ICD-10-CM

## 2011-06-11 ENCOUNTER — Ambulatory Visit (INDEPENDENT_AMBULATORY_CARE_PROVIDER_SITE_OTHER): Payer: Medicare Other | Admitting: Urology

## 2011-06-11 DIAGNOSIS — N411 Chronic prostatitis: Secondary | ICD-10-CM

## 2011-06-11 DIAGNOSIS — N138 Other obstructive and reflux uropathy: Secondary | ICD-10-CM

## 2011-06-11 DIAGNOSIS — R351 Nocturia: Secondary | ICD-10-CM

## 2011-07-16 ENCOUNTER — Other Ambulatory Visit: Payer: Self-pay | Admitting: Neurosurgery

## 2011-07-16 DIAGNOSIS — IMO0002 Reserved for concepts with insufficient information to code with codable children: Secondary | ICD-10-CM

## 2011-07-26 ENCOUNTER — Other Ambulatory Visit (HOSPITAL_COMMUNITY): Payer: Self-pay | Admitting: Neurosurgery

## 2011-07-26 ENCOUNTER — Other Ambulatory Visit: Payer: Self-pay | Admitting: Neurosurgery

## 2011-07-26 DIAGNOSIS — M47817 Spondylosis without myelopathy or radiculopathy, lumbosacral region: Secondary | ICD-10-CM

## 2011-08-13 ENCOUNTER — Encounter (HOSPITAL_COMMUNITY): Payer: Self-pay | Admitting: Pharmacy Technician

## 2011-08-19 ENCOUNTER — Ambulatory Visit (HOSPITAL_COMMUNITY)
Admission: RE | Admit: 2011-08-19 | Discharge: 2011-08-19 | Disposition: A | Discharge status: Home / Self Care | Payer: Medicare Other | Source: Ambulatory Visit | Attending: Neurosurgery | Admitting: Neurosurgery

## 2011-08-19 DIAGNOSIS — M47817 Spondylosis without myelopathy or radiculopathy, lumbosacral region: Secondary | ICD-10-CM | POA: Insufficient documentation

## 2011-08-19 DIAGNOSIS — M545 Low back pain, unspecified: Secondary | ICD-10-CM | POA: Insufficient documentation

## 2011-08-19 DIAGNOSIS — M5137 Other intervertebral disc degeneration, lumbosacral region: Secondary | ICD-10-CM | POA: Insufficient documentation

## 2011-08-19 DIAGNOSIS — M25559 Pain in unspecified hip: Secondary | ICD-10-CM | POA: Insufficient documentation

## 2011-08-19 DIAGNOSIS — M51379 Other intervertebral disc degeneration, lumbosacral region without mention of lumbar back pain or lower extremity pain: Secondary | ICD-10-CM | POA: Insufficient documentation

## 2011-08-19 DIAGNOSIS — M519 Unspecified thoracic, thoracolumbar and lumbosacral intervertebral disc disorder: Secondary | ICD-10-CM | POA: Insufficient documentation

## 2011-08-19 DIAGNOSIS — M48061 Spinal stenosis, lumbar region without neurogenic claudication: Secondary | ICD-10-CM | POA: Insufficient documentation

## 2011-08-19 IMAGING — CT CT L SPINE W/ CM
2 of 13 series · 4 of 20 positions shown, 5 images · non-contrast
Comparison: [DATE] plain film exam.  No comparison myelogram or
MR.

CLINICAL DATA: Left lower back pain.  Right hip pain.  Pacemaker
in place.

LUMBAR MYELOGRAM AND POST MYELOGRAM CT
MYELOGRAM LUMBAR
TECHNIQUE: Contrast was injected by Dr.MOBILERI KLAJDI at the L1-2 level.
Contrast was negotiated into the lumbar spine without difficulty.
Fluoroscopy time:  0.55 minutes.
TECHNIQUE: Multidetector CT imaging of the lumbar spine was
performed following myelography.  Multiplanar CT image
reconstructions were also generated.

[Series 104: coronal s.t. · coronal · 0.47mm/px · 1 of 70 slices shown]
[im 35/70  bone]
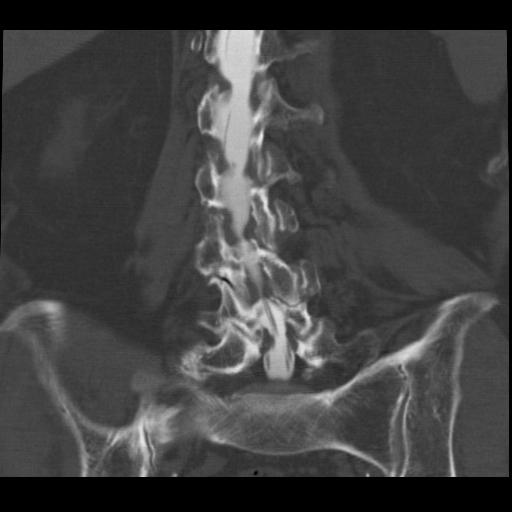

[Series 105: axial s.t. · axial · 0.27mm/px · z∈[-387,-155]mm · 3 of 110 slices shown, 4 images]
[im 1/110  soft-tissue]
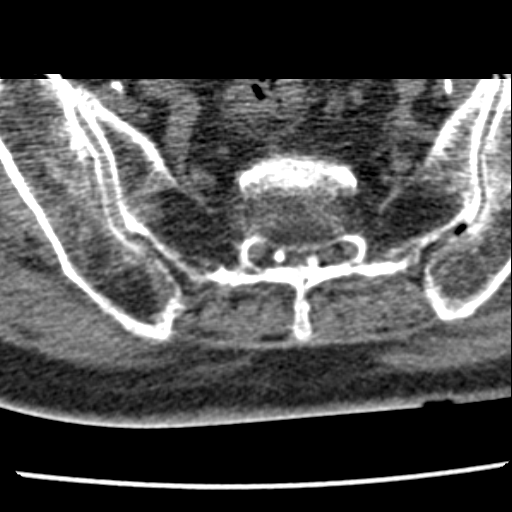
[im 1/110  bone]
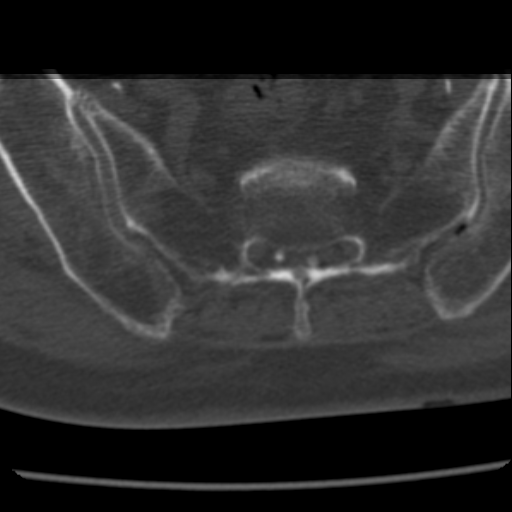
[im 55/110  bone]
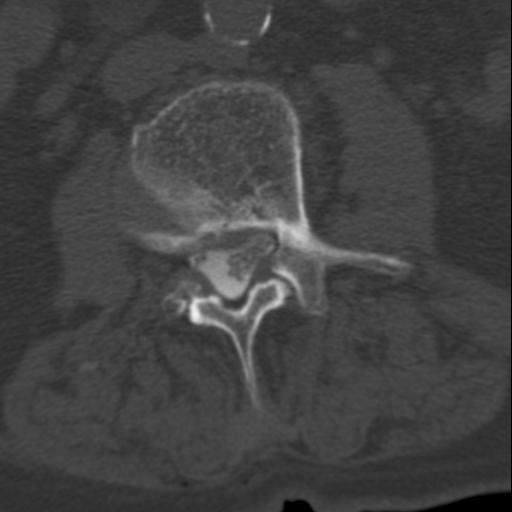
[im 110/110  bone]
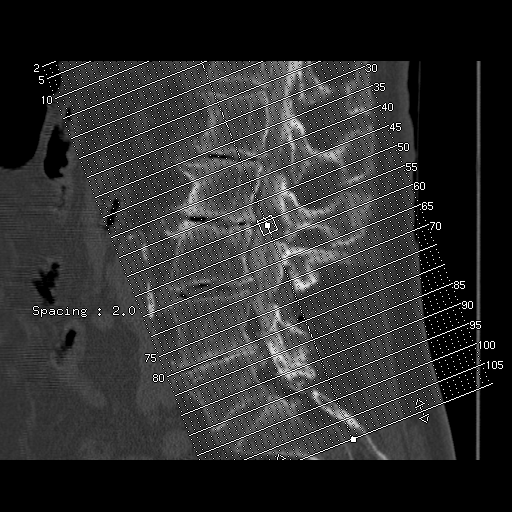

[4 of 20 positions shown; findings below may reference images not displayed]

FINDINGS: Dextroscoliosis.

Impression upon the right lateral aspect of thecal sac at the L1-2
level (takeoff of the right L2 nerve root) and the right lateral
aspect of the thecal sac at the L4-5 level (takeoff of the right L5
nerve root).

Impression upon the left lateral aspect of the thecal sac at the L1-
2, L2-3 and L3-4 level.

l ventral impression upon the thecal sac T12-L1 through L4-5.  This
is most notable at the L1-2 and L3-4 level.  No abnormal motion
between flexion and extension.

No abnormal vessels seen near the conus.  Pacemaker in place.
IMPRESSION: CT MYELOGRAPHY LUMBAR SPINE
FINDINGS: The present examination incorporates from the upper T12
through the S2 level.

Calcified ectatic aorta and iliac arteries. Right iliac artery
bifurcation measures up to 1.5 cm.

Dextroscoliosis of the lumbar spine.

Conus T12-L1 level.

T12-L1:  Mild bulge.  Mild facet joint degenerative changes and
ligamentum flavum hypertrophy greater on the right.

L1-2:  Broad-based disc osteophyte.  Rotation.  Facet joint
degenerative changes and ligamentum flavum hypertrophy.  Mild
spinal stenosis.

L2-3:  Disc degeneration with disc space narrowing greater on the
left.  Broad-based disc osteophyte complex greater left lateral
position with encroachment upon the exiting left L2 nerve root.
Minimal encroachment right L2 nerve root far lateral position.
Facet joint degenerative changes and ligamentum flavum hypertrophy
greater on the left.  Moderate left-sided and mild right-sided
lateral recess stenosis/spinal stenosis.

L3-4:  Facet joint degenerative changes.  Disc degeneration with
rotation and displacement of L3 vertebra to the right.  Broad-based
disc osteophyte complex with bilateral foraminal narrowing and mild
encroaching upon the exiting L3 nerve roots more notable on the
left.  Facet joint degenerative changes and ligamentum flavum
hypertrophy greater on the right.  Mild to moderate lateral recess
stenosis and spinal stenosis greater on the right.

L4-5:  Disc degeneration with disc space narrowing greater on the
right.  Broad-based disc osteophyte complex having greater
extension to the right with mass effect upon the exiting right L4
nerve root.  Facet joint degenerative changes.  Ligamentum flavum
hypertrophy.  Moderate right-sided and mild left-sided lateral
recess stenosis.  Mild to moderate spinal stenosis with a trefoil
appearance.

L5-S1:  Facet joint degenerative changes.  Broad-based disc
osteophyte complex greatest right lateral position with mild
encroachment upon the exiting right L5 nerve root.  Minimal
encroachment exiting left L5 nerve root.  Facet joint degenerative
changes.  No significant spinal stenosis/lateral recess stenosis.

Bony overgrowth upper sacroiliac joints more notable on the right.
IMPRESSION: L1-2 mild spinal stenosis.

L2-3 disc osteophyte complex greater left lateral position with
encroachment upon the exiting left L2 nerve root.  Minimal
encroachment right L2 nerve root far lateral position.  Moderate
left-sided and mild right-sided lateral recess stenosis/spinal
stenosis.

L3-4 disc osteophyte complex with bilateral foraminal narrowing and
mild encroaching upon the exiting L3 nerve roots more notable on
the left.  Mild to moderate lateral recess stenosis and spinal
stenosis greater on the right.

L4-5 disc osteophyte complex having greater extension to the right
with mass effect upon the exiting right L4 nerve root.   Moderate
right-sided and mild left-sided lateral recess stenosis.  Mild to
moderate spinal stenosis with a trefoil appearance.

L5-S1 disc osteophyte complex greatest right lateral position with
mild encroachment upon the exiting right L5 nerve root.  Minimal
encroachment exiting left L5 nerve root.  No significant spinal
stenosis/lateral recess stenosis.

Please see above for further detail.

## 2011-08-19 MED ORDER — DIAZEPAM 5 MG PO TABS
10.0000 mg | ORAL_TABLET | Freq: Once | ORAL | Status: AC
  Administered 2011-08-19: 10 mg via ORAL
  Filled 2011-08-19: qty 2

## 2011-08-19 MED ORDER — ONDANSETRON HCL 4 MG/2ML IJ SOLN: 4.0000 mg | Freq: Four times a day (QID) | INTRAMUSCULAR | Status: DC | PRN

## 2011-08-19 MED ORDER — IOHEXOL 180 MG/ML  SOLN
20.0000 mL | Freq: Once | INTRAMUSCULAR | Status: AC | PRN
  Administered 2011-08-19: 20 mL via INTRATHECAL

## 2011-08-19 NOTE — Procedures (Signed)
Omnipaque 180, L1/2 puncture

## 2011-09-07 ENCOUNTER — Ambulatory Visit (INDEPENDENT_AMBULATORY_CARE_PROVIDER_SITE_OTHER): Payer: Medicare Other | Admitting: Urology

## 2011-09-07 DIAGNOSIS — N138 Other obstructive and reflux uropathy: Secondary | ICD-10-CM

## 2011-09-07 DIAGNOSIS — N403 Nodular prostate with lower urinary tract symptoms: Secondary | ICD-10-CM

## 2011-09-07 DIAGNOSIS — N411 Chronic prostatitis: Secondary | ICD-10-CM

## 2011-09-07 DIAGNOSIS — R351 Nocturia: Secondary | ICD-10-CM

## 2011-10-01 DIAGNOSIS — R55 Syncope and collapse: Secondary | ICD-10-CM

## 2011-10-06 ENCOUNTER — Encounter (INDEPENDENT_AMBULATORY_CARE_PROVIDER_SITE_OTHER): Payer: Self-pay | Admitting: *Deleted

## 2011-10-07 ENCOUNTER — Ambulatory Visit (INDEPENDENT_AMBULATORY_CARE_PROVIDER_SITE_OTHER): Payer: Medicare Other | Admitting: Internal Medicine

## 2011-10-07 ENCOUNTER — Encounter (INDEPENDENT_AMBULATORY_CARE_PROVIDER_SITE_OTHER): Payer: Self-pay | Admitting: Internal Medicine

## 2011-10-07 VITALS — BP 94/46 | HR 68 | Temp 97.0°F | Ht 67.0 in | Wt 144.7 lb

## 2011-10-07 DIAGNOSIS — R634 Abnormal weight loss: Secondary | ICD-10-CM

## 2011-10-07 DIAGNOSIS — K5900 Constipation, unspecified: Secondary | ICD-10-CM

## 2011-10-07 NOTE — Progress Notes (Signed)
Subjective:     Patient ID: Reginald [name], male   DOB: December 20, 1924, 78 y.o.   MRN: [id number]  HPI Reginald [name] is a 78 yr old male referred to our office for constipation. He was admitted to [facility] for  syncopal episodes. He had a syncopal episode at home that day and wa witnessed by his wife. He has a hx of orthostatic hypotension.  He also c/o constipation while in the hospital.  He says he received an enema in the hospital and passes 7-8 small hard balls. He tells me his last bowel will actually be 4 weeks if he lives till this Saturday. No abdominal films obtained. In the last few weeks he has lost 27 pounds.  He says today he is very weak. No rectal bleeding or melena. Appetite is not good.  No abdominal pain. His last colonoscopy was 01/21/2010 for blood in stool and constipation. No active bleeding. NO colonic neoplasms noted. No colonic or rectal polyps. No diverticulosis. No other colonic neoplasms noted.  He tells me he gave his self an enema a few days ago and passed a few balls.   10/03/11 H and H 11.1 and 32.8, MCV 92.7 Review of Systems see hpi Current Outpatient Prescriptions  Medication Sig Dispense Refill  . amiodarone (PACERONE) 200 MG tablet Take 350 mg by mouth daily.       Tery Sanfilippo Calcium (STOOL SOFTENER PO) Take 1 capsule by mouth daily.      Marland Kitchen docusate sodium (COLACE) 100 MG capsule Take 200 mg by mouth daily.       Marland Kitchen latanoprost (XALATAN) 0.005 % ophthalmic solution Place 1 drop into the right eye at bedtime.      Marland Kitchen levothyroxine (SYNTHROID, LEVOTHROID) 100 MCG tablet Take 100 mcg by mouth daily.      . meclizine (ANTIVERT) 25 MG tablet Take 25 mg by mouth 3 (three) times daily as needed.      . midodrine (PROAMATINE) 5 MG tablet Take 5 mg by mouth 2 (two) times daily.      . nitroGLYCERIN (NITROSTAT) 0.4 MG SL tablet Place 0.4 mg under the tongue every 5 (five) minutes as needed. For chest pain.      . polyethylene glycol (MIRALAX / GLYCOLAX) packet Take 17 g by  mouth daily.      . rosuvastatin (CRESTOR) 10 MG tablet Take 10 mg by mouth daily.      Marland Kitchen warfarin (COUMADIN) 5 MG tablet Take 10-15 mg by mouth daily. Take 2.5mg  a day       Current Outpatient Prescriptions on File Prior to Visit  Medication Sig Dispense Refill  . amiodarone (PACERONE) 200 MG tablet Take 350 mg by mouth daily.       Tery Sanfilippo Calcium (STOOL SOFTENER PO) Take 1 capsule by mouth daily.      Marland Kitchen docusate sodium (COLACE) 100 MG capsule Take 200 mg by mouth daily.       Marland Kitchen latanoprost (XALATAN) 0.005 % ophthalmic solution Place 1 drop into the right eye at bedtime.      Marland Kitchen levothyroxine (SYNTHROID, LEVOTHROID) 100 MCG tablet Take 100 mcg by mouth daily.      . meclizine (ANTIVERT) 25 MG tablet Take 25 mg by mouth 3 (three) times daily as needed.      . midodrine (PROAMATINE) 5 MG tablet Take 5 mg by mouth 2 (two) times daily.      . nitroGLYCERIN (NITROSTAT) 0.4 MG SL tablet Place 0.4 mg under the  tongue every 5 (five) minutes as needed. For chest pain.      . rosuvastatin (CRESTOR) 10 MG tablet Take 10 mg by mouth daily.      Marland Kitchen warfarin (COUMADIN) 5 MG tablet Take 10-15 mg by mouth daily. Take 2.5mg  a day       Past Medical History  Diagnosis Date  . Arrhythmia     afib  . Hyperlipidemia   . Syncope and collapse   . Vertigo   . BPH (benign prostatic hyperplasia)   . Anxiety neurosis    Past Surgical History  Procedure Date  . Appendectomy   . Pulse generator implant   . Interrogation     atrial and ventricular  . Enrhythm generator     implant of a medtronic generator   History   Social History  . Marital Status: Married    Spouse Name: N/A    Number of Children: N/A  . Years of Education: N/A   Occupational History  . Not on file.   Social History Main Topics  . Smoking status: Never Smoker   . Smokeless tobacco: Not on file  . Alcohol Use: No  . Drug Use: No  . Sexually Active: Not on file   Other Topics Concern  . Not on file   Social History  Narrative  . No narrative on file   No family status information on file.   No Known Allergies      Objective:   Physical Exam  Filed Vitals:   10/07/11 1115  BP: 94/46  Pulse: 68  Temp: 97 F (36.1 C)  Height: 5\' 7"  (1.702 m)  Weight: 144 lb 11.2 oz (65.635 kg)  Alert and oriented. Skin warm and dry. Oral mucosa is moist.   . Sclera anicteric, conjunctivae is pink. Thyroid not enlarged. No cervical lymphadenopathy. Lungs clear. Heart regular rate and rhythm.  Abdomen is soft. Bowel sounds are positive. No hepatomegaly. No abdominal masses felt. No tenderness.  No edema to lower extremities. Stool soft in rectum and guaiac negative.      Assessment:    Constipation, and weight loss. Colonic neoplasm needs to be ruled out and well as to see if he is constipated.. I discussed this case with Dr. Karilyn Cota.    Plan:   Ct abdomen/pelvis with CM. Further recommendations to follow.

## 2011-10-07 NOTE — Patient Instructions (Signed)
Ct abdomen/pelvis with CM. Fiber supplement.

## 2011-10-08 ENCOUNTER — Ambulatory Visit (HOSPITAL_COMMUNITY)
Admission: RE | Admit: 2011-10-08 | Discharge: 2011-10-08 | Disposition: A | Discharge status: Home / Self Care | Payer: Medicare Other | Source: Ambulatory Visit | Attending: Internal Medicine | Admitting: Internal Medicine

## 2011-10-08 DIAGNOSIS — K5909 Other constipation: Secondary | ICD-10-CM | POA: Insufficient documentation

## 2011-10-08 DIAGNOSIS — R634 Abnormal weight loss: Secondary | ICD-10-CM | POA: Insufficient documentation

## 2011-10-08 DIAGNOSIS — N400 Enlarged prostate without lower urinary tract symptoms: Secondary | ICD-10-CM | POA: Insufficient documentation

## 2011-10-08 DIAGNOSIS — K5900 Constipation, unspecified: Secondary | ICD-10-CM

## 2011-10-08 MED ORDER — IOHEXOL 300 MG/ML  SOLN
100.0000 mL | Freq: Once | INTRAMUSCULAR | Status: AC | PRN
  Administered 2011-10-08: 100 mL via INTRAVENOUS

## 2011-10-11 ENCOUNTER — Telehealth (INDEPENDENT_AMBULATORY_CARE_PROVIDER_SITE_OTHER): Payer: Self-pay | Admitting: Internal Medicine

## 2011-10-11 DIAGNOSIS — K5900 Constipation, unspecified: Secondary | ICD-10-CM

## 2011-10-11 MED ORDER — PEG 3350-KCL-NABCB-NACL-NASULF 236 G PO SOLR: 4.0000 L | Freq: Once | ORAL | Status: AC

## 2011-10-11 NOTE — Telephone Encounter (Signed)
Will call in morning and let me know if he had a BM. Patient is going to need a colonoscopy.

## 2011-10-11 NOTE — Telephone Encounter (Signed)
Will discuss with Dr.Rehman. 

## 2011-10-12 ENCOUNTER — Encounter (HOSPITAL_COMMUNITY): Payer: Self-pay | Admitting: *Deleted

## 2011-10-12 ENCOUNTER — Emergency Department (HOSPITAL_COMMUNITY): Payer: Medicare Other

## 2011-10-12 ENCOUNTER — Emergency Department (HOSPITAL_COMMUNITY)
Admission: EM | Admit: 2011-10-12 | Discharge: 2011-10-13 | Disposition: A | Discharge status: Home / Self Care | Payer: Medicare Other | Attending: Emergency Medicine | Admitting: Emergency Medicine

## 2011-10-12 DIAGNOSIS — K5900 Constipation, unspecified: Secondary | ICD-10-CM | POA: Insufficient documentation

## 2011-10-12 DIAGNOSIS — Z7901 Long term (current) use of anticoagulants: Secondary | ICD-10-CM | POA: Insufficient documentation

## 2011-10-12 DIAGNOSIS — R531 Weakness: Secondary | ICD-10-CM

## 2011-10-12 DIAGNOSIS — Z79899 Other long term (current) drug therapy: Secondary | ICD-10-CM | POA: Insufficient documentation

## 2011-10-12 DIAGNOSIS — N390 Urinary tract infection, site not specified: Secondary | ICD-10-CM

## 2011-10-12 LAB — CBC WITH DIFFERENTIAL/PLATELET
Eosinophils Absolute: 0 10*3/uL (ref 0.0–0.7)
Eosinophils Relative: 1 % (ref 0–5)
Hemoglobin: 11.1 g/dL — ABNORMAL LOW (ref 13.0–17.0)
Lymphs Abs: 1.2 10*3/uL (ref 0.7–4.0)
MCH: 31.5 pg (ref 26.0–34.0)
MCV: 89.8 fL (ref 78.0–100.0)
Monocytes Absolute: 0.3 10*3/uL (ref 0.1–1.0)
Monocytes Relative: 7 % (ref 3–12)
RBC: 3.52 MIL/uL — ABNORMAL LOW (ref 4.22–5.81)

## 2011-10-12 LAB — PROTIME-INR: Prothrombin Time: 20.6 seconds — ABNORMAL HIGH (ref 11.6–15.2)

## 2011-10-12 MED ORDER — SODIUM CHLORIDE 0.9 % IV BOLUS (SEPSIS)
1000.0000 mL | Freq: Once | INTRAVENOUS | Status: AC
  Administered 2011-10-12: 1000 mL via INTRAVENOUS

## 2011-10-12 NOTE — ED Provider Notes (Signed)
History     CSN: [id number]  Arrival date & time 10/12/11  1956   First MD Initiated Contact with Patient 10/12/11 2255      Chief Complaint  Patient presents with  . Weakness  . Constipation    (Consider location/radiation/quality/duration/timing/severity/associated sxs/prior treatment) The history is provided by the patient.  Reginald [name] is a 78 y.o. male hx of HL, BPH, constipation here with weakness and constipation. Weakness for a month or so. He is chronically constipated. He was recently admitted for syncope and was discharged a week ago. He had a recent CT ab/pel that showed a lot of stool in the colon but no obstruction. He is scheduled for colonoscopy but the NP didn't call them back today so the family was concerned and brought him here. He is current on colace and miralax but only has small amounts of stool. He also took [employer] yesterday and only had minimal stool. No fever or chills. He had about 25 pound of weight loss in the last few months.   Past Medical History  Diagnosis Date  . Arrhythmia     afib  . Hyperlipidemia   . Syncope and collapse   . Vertigo   . BPH (benign prostatic hyperplasia)   . Anxiety neurosis   . Atrial fib/flutter, transient     Past Surgical History  Procedure Date  . Appendectomy   . Pulse generator implant   . Interrogation     atrial and ventricular  . Enrhythm generator     implant of a medtronic generator    Family History  Problem Relation Age of Onset  . Cancer Mother   . Heart disease Father   . Cancer Sister     History  Substance Use Topics  . Smoking status: Never Smoker   . Smokeless tobacco: Not on file  . Alcohol Use: No      Review of Systems  Gastrointestinal: Positive for constipation and abdominal distention.  All other systems reviewed and are negative.    Allergies  Review of patient's allergies indicates no known allergies.  Home Medications   Current Outpatient Rx  Name Route Sig  Dispense Refill  . ALFUZOSIN HCL ER 10 MG PO TB24 Oral Take 10 mg by mouth daily.     . AMIODARONE HCL 200 MG PO TABS Oral Take 300 mg by mouth daily.     Marland Kitchen DOCUSATE SODIUM 100 MG PO CAPS Oral Take 200 mg by mouth daily.     Marland Kitchen LATANOPROST 0.005 % OP SOLN Right Eye Place 1 drop into the right eye at bedtime.    Marland Kitchen LEVOTHYROXINE SODIUM 100 MCG PO TABS Oral Take 100 mcg by mouth daily.    Marland Kitchen MECLIZINE HCL 25 MG PO TABS Oral Take 25 mg by mouth 3 (three) times daily as needed.    Marland Kitchen MIDODRINE HCL 5 MG PO TABS Oral Take 5 mg by mouth 2 (two) times daily.    Marland Kitchen NITROGLYCERIN 0.4 MG SL SUBL Sublingual Place 0.4 mg under the tongue every 5 (five) minutes as needed. For chest pain.    Marland Kitchen POLYETHYLENE GLYCOL 3350 PO PACK Oral Take 17 g by mouth daily.    Marland Kitchen ROSUVASTATIN CALCIUM 10 MG PO TABS Oral Take 10 mg by mouth daily.    . WARFARIN SODIUM 5 MG PO TABS Oral Take 2.5 mg by mouth daily. Take 2.5mg  a day      BP 137/82  Pulse 75  Temp 97.8 F (  36.6 C) (Oral)  Resp 18  SpO2 100%  Physical Exam  Nursing note and vitals reviewed. Constitutional: He is oriented to person, place, and time.       Thin, chronically ill   HENT:  Head: Normocephalic.       OP slightly dry   Eyes: Conjunctivae normal are normal. Pupils are equal, round, and reactive to light.  Neck: Normal range of motion. Neck supple.  Cardiovascular: Normal rate, regular rhythm and normal heart sounds.   Pulmonary/Chest: Effort normal and breath sounds normal. No respiratory distress. He has no wheezes. He has no rales.  Abdominal: Soft. Bowel sounds are normal.       Mildly distented, no tenderness   Musculoskeletal: Normal range of motion. He exhibits no edema and no tenderness.  Neurological: He is alert and oriented to person, place, and time.  Skin: Skin is warm and dry.  Psychiatric: He has a normal mood and affect. His behavior is normal. Judgment and thought content normal.    ED Course  Procedures (including critical care  time)  Labs Reviewed  CBC WITH DIFFERENTIAL - Abnormal; Notable for the following:    RBC 3.52 (*)     Hemoglobin 11.1 (*)     HCT 31.6 (*)     Platelets 120 (*)     All other components within normal limits  COMPREHENSIVE METABOLIC PANEL - Abnormal; Notable for the following:    Creatinine, Ser 1.67 (*)     Total Bilirubin 1.8 (*)     GFR calc non Af Amer 36 (*)     GFR calc Af Amer 41 (*)     All other components within normal limits  URINALYSIS, ROUTINE W REFLEX MICROSCOPIC - Abnormal; Notable for the following:    APPearance HAZY (*)     Leukocytes, UA MODERATE (*)     All other components within normal limits  PROTIME-INR - Abnormal; Notable for the following:    Prothrombin Time 20.6 (*)     INR 1.84 (*)     All other components within normal limits  LIPASE, BLOOD  URINE MICROSCOPIC-ADD ON   Dg Abd Acute W/chest  10/12/2011  *RADIOLOGY REPORT*  Clinical Data: 78 year old male constipation nausea and weakness.  ACUTE ABDOMEN SERIES (ABDOMEN 2 VIEW & CHEST 1 VIEW)  Comparison: CT abdomen pelvis 10/08/2011 and earlier.  Findings: Stable left chest dual lead cardiac pacemaker.  Stable lung volumes.  No pulmonary edema, pneumothorax or pneumoperitoneum.  Cardiac size and mediastinal contours are within normal limits.  Gas throughout nondilated small and large bowel.  Interval evacuation of previously seen retained stool in the colon. Dystrophic prostate associated calcifications in the pelvis. Scoliosis and degenerative changes in the spine.  Osteopenia.  IMPRESSION: 1.  Gas throughout nondilated small and large bowel loops may reflect ileus, but no evidence of mechanical obstruction.  No free air. 2.  Interval evacuation of the retained stool seen on 10/08/2011. 3. No acute cardiopulmonary abnormality.   Original Report Authenticated By: Ulla Potash III, M.D.      1. UTI (urinary tract infection)   2. Weakness   3. Constipation       MDM  Reginald [name] is a 78 y.o. male here  with weakness and constipation. His constipation may be due to mass in colon that is suggested on CT. Will get ab xray to r/o SBO. His weakness is likely secondary to dehydration from decreased PO intake. Will get labs and hydrate patient. Will also  consider infection so will get CXR, UA. I counseled family that if labs and xrays nl, he will need to follow up with his GI doctor or I can refer them to another GI doctor for colonoscopy.   12:57 AM Labs showed Cr 1.6 (baseline 1.5), CBC nl, xray showed improving constipation. Possible ileus on xray. Patient able to tolerate PO, feels better after fluids. He has UTI on UA, he was given cipro. I tried to call their GI doctor, but there is no answering service. I recommend that they follow up with their GI doctor or one of the GI doctors here.        Richardean Canal, MD 10/13/11 [phone]

## 2011-10-12 NOTE — ED Notes (Signed)
Pt states has not had a bowel movement in four wks; had a ct scan on Friday and office called and told him he had a bowel obstruction; office called in gallon of golytely--pt drank that yesterday with one small bowel movement; pt denies pain; denies n/v; per family pt has been laying around weak today; pt hospitalized ten days ago for passing out; pt states he didn't eat for a couple of weeks because he was scared to keep eating since he wasn't having bowel movements; pt in no distress at present

## 2011-10-13 LAB — URINALYSIS, ROUTINE W REFLEX MICROSCOPIC
Bilirubin Urine: NEGATIVE
Nitrite: NEGATIVE
Specific Gravity, Urine: 1.022 (ref 1.005–1.030)
Urobilinogen, UA: 1 mg/dL (ref 0.0–1.0)

## 2011-10-13 LAB — COMPREHENSIVE METABOLIC PANEL
Alkaline Phosphatase: 53 U/L (ref 39–117)
BUN: 23 mg/dL (ref 6–23)
Calcium: 8.4 mg/dL (ref 8.4–10.5)
Creatinine, Ser: 1.67 mg/dL — ABNORMAL HIGH (ref 0.50–1.35)
GFR calc Af Amer: 41 mL/min — ABNORMAL LOW (ref >90)
Glucose, Bld: 88 mg/dL (ref 70–99)
Total Protein: 6.3 g/dL (ref 6.0–8.3)

## 2011-10-13 LAB — URINE MICROSCOPIC-ADD ON

## 2011-10-13 LAB — LIPASE, BLOOD: Lipase: 29 U/L (ref 11–59)

## 2011-10-13 MED ORDER — CIPROFLOXACIN HCL 500 MG PO TABS: 500.0000 mg | ORAL_TABLET | Freq: Two times a day (BID) | ORAL | Status: DC

## 2011-10-13 MED ORDER — CIPROFLOXACIN IN D5W 400 MG/200ML IV SOLN: 400.0000 mg | Freq: Once | INTRAVENOUS | Status: DC

## 2011-10-13 MED ORDER — CIPROFLOXACIN HCL 500 MG PO TABS
500.0000 mg | ORAL_TABLET | Freq: Once | ORAL | Status: AC
  Administered 2011-10-13: 500 mg via ORAL
  Filled 2011-10-13: qty 1

## 2011-10-14 ENCOUNTER — Telehealth (INDEPENDENT_AMBULATORY_CARE_PROVIDER_SITE_OTHER): Payer: Self-pay | Admitting: *Deleted

## 2011-10-14 NOTE — Telephone Encounter (Signed)
Per Dr.rehman the patient should take Miralax twice a day. He should be encouraged to eat. Plan Colonoscopy for next week . Dewayne Hatch was made aware. Mrs.Cohick was called and made aware that Dewayne Hatch would be calling her 10/15/11 about Colonoscopy.

## 2011-10-14 NOTE — Telephone Encounter (Signed)
Mrs. Lamarque called and states that Mr.Ancrum did drink the Go-Lytely night before last, he went to the bathroom and it was only 1 hard ball that passed. Then it was a lot of colored liquid that passed. He has lost 27 pounds. He is weak. He is now nibbling at eating They are wanting to know what else can be done. Mrs. Lipschutz says that they are willing to bring him to [facility] ED. Dr. Karilyn Cota was paged.

## 2011-10-15 ENCOUNTER — Other Ambulatory Visit (INDEPENDENT_AMBULATORY_CARE_PROVIDER_SITE_OTHER): Payer: Self-pay | Admitting: *Deleted

## 2011-10-15 ENCOUNTER — Encounter (INDEPENDENT_AMBULATORY_CARE_PROVIDER_SITE_OTHER): Payer: Self-pay

## 2011-10-15 DIAGNOSIS — R634 Abnormal weight loss: Secondary | ICD-10-CM

## 2011-10-15 DIAGNOSIS — K5900 Constipation, unspecified: Secondary | ICD-10-CM

## 2011-10-15 NOTE — Telephone Encounter (Signed)
TCS will be sch'd for 10/20/11, all instructions have been given to Ms Moeller, Dr Kandis Cocking office has ok'd for patient to stop coumadin 5 days prior to TCS, Ms Remo is aware to stop coumadin

## 2011-10-18 ENCOUNTER — Encounter (HOSPITAL_COMMUNITY): Payer: Self-pay | Admitting: Pharmacy Technician

## 2011-10-19 MED ORDER — SODIUM CHLORIDE 0.45 % IV SOLN
INTRAVENOUS | Status: DC
  Administered 2011-10-20: 16:00:00 via INTRAVENOUS

## 2011-10-20 ENCOUNTER — Encounter (HOSPITAL_COMMUNITY): Payer: Self-pay | Admitting: *Deleted

## 2011-10-20 ENCOUNTER — Ambulatory Visit (HOSPITAL_COMMUNITY)
Admission: RE | Admit: 2011-10-20 | Discharge: 2011-10-20 | Disposition: A | Discharge status: Home / Self Care | Payer: Medicare Other | Source: Ambulatory Visit | Attending: Internal Medicine | Admitting: Internal Medicine

## 2011-10-20 ENCOUNTER — Encounter (HOSPITAL_COMMUNITY)
Admission: RE | Disposition: A | Discharge status: Home / Self Care | Payer: Self-pay | Source: Ambulatory Visit | Attending: Internal Medicine

## 2011-10-20 DIAGNOSIS — K644 Residual hemorrhoidal skin tags: Secondary | ICD-10-CM | POA: Insufficient documentation

## 2011-10-20 DIAGNOSIS — K5909 Other constipation: Secondary | ICD-10-CM | POA: Insufficient documentation

## 2011-10-20 DIAGNOSIS — K5900 Constipation, unspecified: Secondary | ICD-10-CM

## 2011-10-20 DIAGNOSIS — R634 Abnormal weight loss: Secondary | ICD-10-CM | POA: Insufficient documentation

## 2011-10-20 DIAGNOSIS — E785 Hyperlipidemia, unspecified: Secondary | ICD-10-CM | POA: Insufficient documentation

## 2011-10-20 DIAGNOSIS — D126 Benign neoplasm of colon, unspecified: Secondary | ICD-10-CM

## 2011-10-20 DIAGNOSIS — R933 Abnormal findings on diagnostic imaging of other parts of digestive tract: Secondary | ICD-10-CM

## 2011-10-20 DIAGNOSIS — K5730 Diverticulosis of large intestine without perforation or abscess without bleeding: Secondary | ICD-10-CM

## 2011-10-20 DIAGNOSIS — R110 Nausea: Secondary | ICD-10-CM | POA: Insufficient documentation

## 2011-10-20 HISTORY — PX: COLONOSCOPY: SHX5424

## 2011-10-20 SURGERY — COLONOSCOPY
Anesthesia: Moderate Sedation

## 2011-10-20 MED ORDER — MEPERIDINE HCL 50 MG/ML IJ SOLN
INTRAMUSCULAR | Status: AC
  Filled 2011-10-20: qty 1

## 2011-10-20 MED ORDER — PEG 3350-KCL-NA BICARB-NACL 420 G PO SOLR
4000.0000 mL | Freq: Once | ORAL | Status: AC
  Administered 2011-10-20: 4000 mL via ORAL
  Filled 2011-10-20: qty 4000

## 2011-10-20 MED ORDER — MEGESTROL ACETATE 40 MG/ML PO SUSP: 200.0000 mg | Freq: Every day | ORAL | Status: DC

## 2011-10-20 MED ORDER — MIDAZOLAM HCL 5 MG/5ML IJ SOLN
INTRAMUSCULAR | Status: AC
  Filled 2011-10-20: qty 10

## 2011-10-20 MED ORDER — MIDAZOLAM HCL 5 MG/5ML IJ SOLN
INTRAMUSCULAR | Status: DC | PRN
  Administered 2011-10-20: 2 mg via INTRAVENOUS
  Administered 2011-10-20: 1 mg via INTRAVENOUS

## 2011-10-20 MED ORDER — STERILE WATER FOR IRRIGATION IR SOLN
Status: DC | PRN
  Administered 2011-10-20: 18:00:00

## 2011-10-20 MED ORDER — MEPERIDINE HCL 50 MG/ML IJ SOLN
INTRAMUSCULAR | Status: DC | PRN
  Administered 2011-10-20: 25 mg via INTRAVENOUS

## 2011-10-20 NOTE — Discharge Instructions (Signed)
Resume warfarin at usual dose starting tomorrow evening. Resume other medications as before. Megace one teaspoonful or 200 mg by mouth daily. No aspirin for one week. Remember you cannot have MRI until Hemoclip has passed. Physician will contact you with biopsy results  Colonoscopy Care After These instructions give you information on caring for yourself after your procedure. Your doctor may also give you more specific instructions. Call your doctor if you have any problems or questions after your procedure. HOME CARE  Take it easy for the next 24 hours.  Rest.  Walk or use warm packs on your belly (abdomen) if you have belly cramping or gas.  Do not drive for 24 hours.  You may shower.  Do not sign important papers or use machinery for 24 hours.  Drink enough fluids to keep your pee (urine) clear or pale yellow.  Resume your normal diet. Avoid heavy or fried foods.  Avoid alcohol.  Continue taking your normal medicines.  Only take medicine as told by your doctor. Do not take aspirin. If you had growths (polyps) removed:  Do not take aspirin.  Do not drink alcohol for 7 days or as told by your doctor.  Eat a soft diet for 24 hours. GET HELP RIGHT AWAY IF:  You have a fever.  You pass clumps of tissue (blood clots) or fill the toilet with blood.  You have belly pain that gets worse and medicine does not help.  Your belly is puffy (swollen).  You feel sick to your stomach (nauseous) or throw up (vomit). MAKE SURE YOU:  Understand these instructions.  Will watch your condition.  Will get help right away if you are not doing well or get worse. Document Released: 01/23/2010 Document Revised: 03/15/2011 Document Reviewed: 01/23/2010 [facility] Patient Information 2013 [place], [place].

## 2011-10-20 NOTE — H&P (Signed)
Reginald [name] is an 78 y.o. male.   Chief Complaint: Patient is here for colonoscopy. HPI: Patient is 78 year old Caucasian male who was evaluated for constipation and 36 pound weight loss occurring over a period of 2 months. He has anorexia but denied abdominal pain. Abdominopelvic CT revealed a colon full of stool to the level of proximal sigmoid colon the distal colon was collapsed. No definite mass was identified but colonoscopy recommended to rule out colonic neoplasm or stricture. He denies nausea or vomiting. His lab studies been pertinent for mild anemia with hemoglobin of around 11 g, cytopenia with platelet count of 120,000 and he also has nausea elevated bilirubin with normal AP and transaminases. Patient,s  last colonoscopy was about 2 years ago and reportedly was normal.  Past Medical History  Diagnosis Date  . Arrhythmia     afib  . Hyperlipidemia   . Syncope and collapse   . Vertigo   . BPH (benign prostatic hyperplasia)   . Anxiety neurosis   . Atrial fib/flutter, transient     Past Surgical History  Procedure Date  . Appendectomy   . Pulse generator implant   . Interrogation     atrial and ventricular  . Enrhythm generator     implant of a medtronic generator    Family History  Problem Relation Age of Onset  . Cancer Mother   . Heart disease Father   . Cancer Sister    Social History:  reports that he has never smoked. He does not have any smokeless tobacco history on file. He reports that he does not drink alcohol or use illicit drugs.  Allergies: No Known Allergies  Medications Prior to Admission  Medication Sig Dispense Refill  . amiodarone (PACERONE) 200 MG tablet Take 300 mg by mouth daily.       . ciprofloxacin (CIPRO) 500 MG tablet Take 500 mg by mouth 2 (two) times daily. One po bid x 7 days      . latanoprost (XALATAN) 0.005 % ophthalmic solution Place 1 drop into the right eye at bedtime.      Marland Kitchen levothyroxine (SYNTHROID, LEVOTHROID) 100 MCG tablet  Take 100 mcg by mouth daily.      . meclizine (ANTIVERT) 25 MG tablet Take 25 mg by mouth 3 (three) times daily as needed. Dizziness.      . midodrine (PROAMATINE) 5 MG tablet Take 5 mg by mouth 2 (two) times daily.      . nitroGLYCERIN (NITROSTAT) 0.4 MG SL tablet Place 0.4 mg under the tongue every 5 (five) minutes as needed. For chest pain.      . polyethylene glycol (MIRALAX / GLYCOLAX) packet Take 17 g by mouth daily.      . rosuvastatin (CRESTOR) 10 MG tablet Take 10 mg by mouth daily.      Marland Kitchen warfarin (COUMADIN) 10 MG tablet Take 10 mg by mouth daily.      Marland Kitchen alfuzosin (UROXATRAL) 10 MG 24 hr tablet Take 10 mg by mouth daily.         No results found for this or any previous visit (from the past 48 hour(s)). No results found.  ROS  Blood pressure 106/74, pulse 75, temperature 98.2 F (36.8 C), temperature source Oral, resp. rate 16, SpO2 97.00%. Physical Exam  Constitutional: He appears well-developed.       thin Caucasian male in NAD  HENT:  Mouth/Throat: Oropharynx is clear and moist.  Eyes: Conjunctivae normal are normal. No scleral icterus.  Neck: No thyromegaly present.  Cardiovascular: Normal rate, regular rhythm and normal heart sounds.   No murmur heard. Respiratory: Effort normal and breath sounds normal.  GI: Soft. He exhibits no distension and no mass. There is no tenderness.  Musculoskeletal: He exhibits no edema.  Lymphadenopathy:    He has no cervical adenopathy.  Neurological: He is alert.  Skin: Skin is warm and dry.     Assessment/Plan Weight loss and constipation. Abnormal CT. Diagnostic colonoscopy.  Reginald [name] U 10/20/2011, 6:39 PM

## 2011-10-20 NOTE — Progress Notes (Signed)
Pt arrived to Short Stay for bowel prep. Pt or  Wife did not understand why they were here. Agreed to stay and take prep and do TCS after explaination given.

## 2011-10-20 NOTE — Op Note (Signed)
COLONOSCOPY PROCEDURE REPORT  PATIENT:  Reginald [name]  MR#:  [id number] Birthdate:  1924/12/25, 78 y.o., male Endoscopist:  Dr. Malissa Hippo, MD Referred By:  Dr. Annette Stable A. Olena Leatherwood, MD Procedure Date: 10/20/2011  Procedure:   Colonoscopy with snare polypectomy and Hemoclip application.  Indications:  Patient is 78 year old Caucasian male with multiple medical problems who has anorexia and over 30 pound weight loss. He also has developed constipation. Abdominopelvic CT which revealed: Full of stool stopping at proximal sigmoid colon. He is therefore undergoing diagnostic colonoscopy. Last colonoscopy was in January 2012 and reportedly was normal.  Informed Consent:  The procedure and risks were reviewed with the patient and informed consent was obtained.  Medications:  Demerol 25 mg IV Versed 3 mg IV  Description of procedure:  After a digital rectal exam was performed, that colonoscope was advanced from the anus through the rectum and colon to the area of the cecum, ileocecal valve and appendiceal orifice. The cecum was deeply intubated. These structures were well-seen and photographed for the record. From the level of the cecum and ileocecal valve, the scope was slowly and cautiously withdrawn. The mucosal surfaces were carefully surveyed utilizing scope tip to flexion to facilitate fold flattening as needed. The scope was pulled down into the rectum where a thorough exam including retroflexion was performed.  Findings:   Prep fair to satisfactory. 2 scattered diverticula at sigmoid colon. Approximately 10 x 30 mm sessile polyp across ileocecal valve. Polyp was elevated with saline injection and piecemeal polypectomy performed. Single Hemoclip applied to polypectomy site. Normal rectal mucosa. Roy's below the dentate line.  Therapeutic/Diagnostic Maneuvers Performed:  See above  Complications:  None  Cecal Withdrawal Time:  31 minutes  Impression:  Examination performed to  cecum. No evidence of colonic stricture or mass. Few diverticula at sigmoid colon. 10 x 30 mm polyp at cecum. Daily assisted piecemeal polypectomy performed along with single Hemoclip application/  Recommendations:  Standard instructions given. Patient will resume warfarin at usual dose starting tomorrow. Megace 200 mg by mouth daily. Will schedule patient for chest CT. I am concerned if his symptoms are secondary to amiodarone. Will check with Dr. Alanda Amass if does could be reduced. Patient informed that he cannot undergo MRI until he has passed Hemoclip.  Zailee Vallely U  10/20/2011 8:00 PM  CC: Dr. Toma Deiters, MD & Dr. Bonnetta Barry ref. provider found

## 2011-10-22 ENCOUNTER — Telehealth (INDEPENDENT_AMBULATORY_CARE_PROVIDER_SITE_OTHER): Payer: Self-pay | Admitting: *Deleted

## 2011-10-22 NOTE — Telephone Encounter (Signed)
A prior authorization was needed,this was completed and faxed to the insurance company. Mrs. Bodkins called and made aware.

## 2011-10-22 NOTE — Telephone Encounter (Signed)
Mrs.Zeman states that her husband is no better. She feels that he may need to have the upper part of the body . She ask if Dr.Rehman was going to do this or another doctor? I told the patient that I would send this to Dr.Rehman for review. She may be reached at [phone].

## 2011-10-22 NOTE — Telephone Encounter (Signed)
Rose Fillers stating Dr. Karilyn Cota was going to call in a medication after his TCS that was on Wed., 10/20/11. The pharmacy has not received it. Patient's return phone number is [phone].

## 2011-10-24 NOTE — Telephone Encounter (Signed)
alrerady addressed

## 2011-10-25 ENCOUNTER — Encounter (INDEPENDENT_AMBULATORY_CARE_PROVIDER_SITE_OTHER): Payer: Self-pay | Admitting: *Deleted

## 2011-10-26 ENCOUNTER — Encounter (HOSPITAL_COMMUNITY): Payer: Self-pay | Admitting: Internal Medicine

## 2011-10-28 ENCOUNTER — Other Ambulatory Visit (INDEPENDENT_AMBULATORY_CARE_PROVIDER_SITE_OTHER): Payer: Self-pay | Admitting: Internal Medicine

## 2011-10-28 DIAGNOSIS — R630 Anorexia: Secondary | ICD-10-CM

## 2011-10-28 DIAGNOSIS — R634 Abnormal weight loss: Secondary | ICD-10-CM

## 2011-10-29 ENCOUNTER — Ambulatory Visit (HOSPITAL_COMMUNITY)
Admission: RE | Admit: 2011-10-29 | Discharge: 2011-10-29 | Disposition: A | Discharge status: Home / Self Care | Payer: Medicare Other | Source: Ambulatory Visit | Attending: Internal Medicine | Admitting: Internal Medicine

## 2011-10-29 DIAGNOSIS — Z950 Presence of cardiac pacemaker: Secondary | ICD-10-CM | POA: Insufficient documentation

## 2011-10-29 DIAGNOSIS — R630 Anorexia: Secondary | ICD-10-CM

## 2011-10-29 DIAGNOSIS — J8410 Pulmonary fibrosis, unspecified: Secondary | ICD-10-CM | POA: Insufficient documentation

## 2011-10-29 DIAGNOSIS — R634 Abnormal weight loss: Secondary | ICD-10-CM

## 2011-10-29 DIAGNOSIS — M47814 Spondylosis without myelopathy or radiculopathy, thoracic region: Secondary | ICD-10-CM | POA: Insufficient documentation

## 2011-10-29 MED ORDER — IOHEXOL 300 MG/ML  SOLN
80.0000 mL | Freq: Once | INTRAMUSCULAR | Status: AC | PRN
  Administered 2011-10-29: 80 mL via INTRAVENOUS

## 2011-11-01 ENCOUNTER — Emergency Department (HOSPITAL_COMMUNITY): Payer: Medicare Other

## 2011-11-01 ENCOUNTER — Encounter (HOSPITAL_COMMUNITY): Payer: Self-pay | Admitting: Emergency Medicine

## 2011-11-01 ENCOUNTER — Telehealth (INDEPENDENT_AMBULATORY_CARE_PROVIDER_SITE_OTHER): Payer: Self-pay | Admitting: *Deleted

## 2011-11-01 ENCOUNTER — Emergency Department (HOSPITAL_COMMUNITY)
Admission: EM | Admit: 2011-11-01 | Discharge: 2011-11-02 | Disposition: A | Discharge status: Home / Self Care | Payer: Medicare Other | Attending: Emergency Medicine | Admitting: Emergency Medicine

## 2011-11-01 DIAGNOSIS — N400 Enlarged prostate without lower urinary tract symptoms: Secondary | ICD-10-CM | POA: Insufficient documentation

## 2011-11-01 DIAGNOSIS — R55 Syncope and collapse: Secondary | ICD-10-CM

## 2011-11-01 DIAGNOSIS — R300 Dysuria: Secondary | ICD-10-CM | POA: Insufficient documentation

## 2011-11-01 DIAGNOSIS — Z9889 Other specified postprocedural states: Secondary | ICD-10-CM | POA: Insufficient documentation

## 2011-11-01 DIAGNOSIS — Z79899 Other long term (current) drug therapy: Secondary | ICD-10-CM | POA: Insufficient documentation

## 2011-11-01 DIAGNOSIS — Z7901 Long term (current) use of anticoagulants: Secondary | ICD-10-CM | POA: Insufficient documentation

## 2011-11-01 DIAGNOSIS — K5900 Constipation, unspecified: Secondary | ICD-10-CM

## 2011-11-01 DIAGNOSIS — I4891 Unspecified atrial fibrillation: Secondary | ICD-10-CM

## 2011-11-01 DIAGNOSIS — F411 Generalized anxiety disorder: Secondary | ICD-10-CM | POA: Insufficient documentation

## 2011-11-01 DIAGNOSIS — R5383 Other fatigue: Secondary | ICD-10-CM | POA: Insufficient documentation

## 2011-11-01 DIAGNOSIS — Z9089 Acquired absence of other organs: Secondary | ICD-10-CM | POA: Insufficient documentation

## 2011-11-01 DIAGNOSIS — R634 Abnormal weight loss: Secondary | ICD-10-CM

## 2011-11-01 DIAGNOSIS — R5381 Other malaise: Secondary | ICD-10-CM | POA: Insufficient documentation

## 2011-11-01 DIAGNOSIS — E785 Hyperlipidemia, unspecified: Secondary | ICD-10-CM | POA: Insufficient documentation

## 2011-11-01 LAB — CBC WITH DIFFERENTIAL/PLATELET
Basophils Absolute: 0 10*3/uL (ref 0.0–0.1)
Basophils Relative: 0 % (ref 0–1)
Eosinophils Absolute: 0.1 10*3/uL (ref 0.0–0.7)
Eosinophils Relative: 1 % (ref 0–5)
HCT: 28.5 % — ABNORMAL LOW (ref 39.0–52.0)
MCH: 31.1 pg (ref 26.0–34.0)
MCHC: 34.4 g/dL (ref 30.0–36.0)
MCV: 90.5 fL (ref 78.0–100.0)
Monocytes Absolute: 0.6 10*3/uL (ref 0.1–1.0)
RDW: 15.2 % (ref 11.5–15.5)

## 2011-11-01 LAB — COMPREHENSIVE METABOLIC PANEL
AST: 29 U/L (ref 0–37)
CO2: 24 mEq/L (ref 19–32)
Calcium: 8.4 mg/dL (ref 8.4–10.5)
Creatinine, Ser: 1.43 mg/dL — ABNORMAL HIGH (ref 0.50–1.35)
GFR calc non Af Amer: 43 mL/min — ABNORMAL LOW (ref >90)

## 2011-11-01 MED ORDER — SODIUM CHLORIDE 0.9 % IV BOLUS (SEPSIS)
500.0000 mL | Freq: Once | INTRAVENOUS | Status: AC
  Administered 2011-11-01: 500 mL via INTRAVENOUS

## 2011-11-01 NOTE — Telephone Encounter (Signed)
Reginald [name], step daughter to Reginald [name] has called and she has taken her Mom, Reginald [name], to have her knee surgery today at [facility].  Reginald [name] was determined he was going.  She said her had a bowel movement, looked like tapeworms in the toliet.  She doubts that is the problem but, felt she should mention.  He is complaining of being very cold.  Can hardly go.  She wants to know could you, Dr. Karilyn Cota, call and have them admit him.  I told her I didn't think so but he may have to go thru the ER where they could do a work-up.  Told her we would return her call once Dr. Karilyn Cota has advice.

## 2011-11-01 NOTE — ED Notes (Signed)
Pt is aware that we need a urine sample 

## 2011-11-01 NOTE — ED Provider Notes (Signed)
History     CSN: [id number]  Arrival date & time 11/01/11  1700   First MD Initiated Contact with Patient 11/01/11 1940      Chief Complaint  Patient presents with  . Weight Loss  . Dysuria    (Consider location/radiation/quality/duration/timing/severity/associated sxs/prior treatment) Patient is a 78 y.o. male presenting with dysuria.  Dysuria  Pertinent negatives include no chills, no nausea, no vomiting, no frequency and no hematuria.   Pt presents with constipation failing multiple medications, weight loss of 40 in the past few weeks, lightheadedness and multiple falls and decreased PO intake. He has been seen several time in the ED with essentially negative workup. Pt denies fevers, chills, abd pain, N/V, headache, focal weakness.  Past Medical History  Diagnosis Date  . Arrhythmia     afib  . Hyperlipidemia   . Syncope and collapse   . Vertigo   . BPH (benign prostatic hyperplasia)   . Anxiety neurosis   . Atrial fib/flutter, transient     Past Surgical History  Procedure Date  . Appendectomy   . Pulse generator implant   . Interrogation     atrial and ventricular  . Enrhythm generator     implant of a medtronic generator  . Colonoscopy 10/20/2011    Procedure: COLONOSCOPY;  Surgeon: Malissa Hippo, MD;  Location: [facility];  Service: Endoscopy;  Laterality: N/A;    Family History  Problem Relation Age of Onset  . Cancer Mother   . Heart disease Father   . Cancer Sister     History  Substance Use Topics  . Smoking status: Never Smoker   . Smokeless tobacco: Not on file  . Alcohol Use: No      Review of Systems  Constitutional: Positive for fatigue. Negative for fever and chills.  HENT: Negative for neck pain.   Respiratory: Negative for shortness of breath.   Cardiovascular: Negative for chest pain, palpitations and leg swelling.  Gastrointestinal: Positive for constipation. Negative for nausea, vomiting, abdominal pain and diarrhea.    Genitourinary: Positive for dysuria. Negative for frequency and hematuria.  Musculoskeletal: Negative for back pain.  Skin: Negative for rash and wound.  Neurological: Positive for dizziness, syncope, weakness and light-headedness. Negative for seizures, numbness and headaches.    Allergies  Review of patient's allergies indicates no known allergies.  Home Medications   Current Outpatient Rx  Name Route Sig Dispense Refill  . ALFUZOSIN HCL ER 10 MG PO TB24 Oral Take 10 mg by mouth daily.     . AMIODARONE HCL 200 MG PO TABS Oral Take 100 mg by mouth daily.     Marland Kitchen BISACODYL 5 MG PO TBEC Oral Take 5 mg by mouth daily as needed.    Marland Kitchen LATANOPROST 0.005 % OP SOLN Right Eye Place 1 drop into the right eye at bedtime.    Marland Kitchen LEVOTHYROXINE SODIUM 100 MCG PO TABS Oral Take 100 mcg by mouth daily.    . LUBIPROSTONE 24 MCG PO CAPS Oral Take 24 mcg by mouth 2 (two) times daily with a meal.    . MECLIZINE HCL 25 MG PO TABS Oral Take 25 mg by mouth 3 (three) times daily as needed. Dizziness.    . MEGESTROL ACETATE 40 MG/ML PO SUSP Oral Take 5 mLs (200 mg total) by mouth daily. 240 mL 1  . MIDODRINE HCL 5 MG PO TABS Oral Take 5 mg by mouth 2 (two) times daily.    Marland Kitchen NITROGLYCERIN 0.4 MG  SL SUBL Sublingual Place 0.4 mg under the tongue every 5 (five) minutes as needed. For chest pain.    Marland Kitchen POLYETHYLENE GLYCOL 3350 PO PACK Oral Take 17 g by mouth daily.    Marland Kitchen ROSUVASTATIN CALCIUM 10 MG PO TABS Oral Take 10 mg by mouth daily.    . WARFARIN SODIUM 2.5 MG PO TABS Oral Take 2.5 mg by mouth daily.      BP 96/52  Pulse 77  Temp 97.4 F (36.3 C) (Oral)  Resp 20  SpO2 98%  Physical Exam  Nursing note and vitals reviewed. Constitutional: He is oriented to person, place, and time. He appears well-developed and well-nourished. No distress.  HENT:  Head: Normocephalic and atraumatic.  Mouth/Throat: Oropharynx is clear and moist.  Eyes: EOM are normal. Pupils are equal, round, and reactive to light.  Neck:  Normal range of motion. Neck supple.  Cardiovascular: Normal rate and regular rhythm.   Pulmonary/Chest: Effort normal and breath sounds normal. No respiratory distress. He has no wheezes. He has no rales. He exhibits no tenderness.  Abdominal: Soft. Bowel sounds are normal. He exhibits no distension and no mass. There is no tenderness. There is no rebound and no guarding.  Genitourinary: Rectum normal. Guaiac negative stool.       Enlarged non-tender prostate, no stool in rectal vault  Musculoskeletal: Normal range of motion. He exhibits no edema and no tenderness.  Neurological: He is alert and oriented to person, place, and time.       5/5 motor in all ext, sensation intact.   Skin: Skin is warm and dry. No rash noted. No erythema.  Psychiatric: He has a normal mood and affect. His behavior is normal.    ED Course  Procedures (including critical care time)  Labs Reviewed  CBC WITH DIFFERENTIAL - Abnormal; Notable for the following:    RBC 3.15 (*)     Hemoglobin 9.8 (*)     HCT 28.5 (*)     All other components within normal limits  COMPREHENSIVE METABOLIC PANEL - Abnormal; Notable for the following:    Glucose, Bld 103 (*)     BUN 24 (*)     Creatinine, Ser 1.43 (*)     Albumin 3.3 (*)     Total Bilirubin 1.3 (*)     GFR calc non Af Amer 43 (*)     GFR calc Af Amer 50 (*)     All other components within normal limits  URINALYSIS, ROUTINE W REFLEX MICROSCOPIC  PROTIME-INR  APTT  OCCULT BLOOD X 1 CARD TO LAB, STOOL   Dg Abd Acute W/chest  11/01/2011  *RADIOLOGY REPORT*  Clinical Data: Constipation for 10 weeks.  Abdominal tenderness.  ACUTE ABDOMEN SERIES (ABDOMEN 2 VIEW & CHEST 1 VIEW)  Comparison: 10/12/2011  Findings: Stable dual lead pacemaker from left subclavian approach. Cardiomegaly.  Low lung volumes with mild crowding of the markings at the bases but no focal infiltrates or failure.  Calcified tortuous aorta.  Moderate stool burden throughout the colon without  visible obstruction or free air.  Dystrophic prostate calcifications. Degenerative change lumbar spine.  Scoliosis convex right. Osteopenia.  IMPRESSION: Constipation without visible obstruction or free air.  No active cardiopulmonary disease.   Original Report Authenticated By: Elsie Stain, M.D.      No diagnosis found.   Date: 11/01/2011  Rate: 77  Rhythm: normal sinus rhythm  QRS Axis: normal  Intervals: normal  ST/T Wave abnormalities: nonspecific T wave changes  Conduction Disutrbances:none  Narrative Interpretation:   Old EKG Reviewed: unchanged Atrial paced rhythm  MDM   Discussed possible admission with Dr Conley Rolls. He will see pt and talk to family and help disposition.        Loren Racer, MD 11/02/11 [phone]

## 2011-11-01 NOTE — ED Notes (Signed)
Pt presenting to ed with c/o per daughter pt has loss approximately 40lbs in 3 1/2 weeks. Per daughter decreased appetite and pt is always cold. Per daughter today when pt urinated in looked like spaghetti noodles. Pt denies pain with urination. Pt's daughter states pt has seen multiple doctors and something is going on but no one can tell them what's going on. Per daughter pt symptoms x 1 month

## 2011-11-02 DIAGNOSIS — I4891 Unspecified atrial fibrillation: Secondary | ICD-10-CM

## 2011-11-02 DIAGNOSIS — K5900 Constipation, unspecified: Secondary | ICD-10-CM

## 2011-11-02 DIAGNOSIS — R55 Syncope and collapse: Secondary | ICD-10-CM

## 2011-11-02 DIAGNOSIS — R634 Abnormal weight loss: Secondary | ICD-10-CM

## 2011-11-02 LAB — APTT: aPTT: 50 seconds — ABNORMAL HIGH (ref 24–37)

## 2011-11-02 LAB — PROTIME-INR: INR: 3.24 — ABNORMAL HIGH (ref 0.00–1.49)

## 2011-11-02 NOTE — Consult Note (Signed)
[facility] History and Physical  Reginald [name] ZOX:[id number] DOB: 1924-02-08    PCP:   Toma Deiters, MD   Chief Complaint: weight loss and constipation.  HPI: Reginald [name] is an 78 y.o. male presents to the ER with continued weight loss and constipation.  This is not a new problem, but it has been about 2-3 months.  He also has anorexia and hx of frequent falls, though he hadn't fallen for about a month.  He has been worrying about his wife who is in the hospital for knee surgery.  He has been on Amiodarone for 10 plus years, and recently, has been decreasing the dose according to Dr Linford Arnold because of suspicion for side effects.  He denied taking any narcotics, though he has back pain for many years and recently had a back injection.  He denied being depressed.  He had colonoscopy, and had a polyp, but no EGD as yet. He was given Megace but hadn't taken it due to cost constraint.  He denied taking any narcotics. He denied any GU complaints.  Rewiew of Systems:  Constitutional: Negative for malaise, fever and chills.  Eyes: Negative for eye pain, redness and discharge, diplopia, visual changes, or flashes of light. ENMT: Negative for ear pain, hoarseness, nasal congestion, sinus pressure and sore throat. No headaches; tinnitus, drooling, or problem swallowing. Cardiovascular: Negative for chest pain, palpitations, diaphoresis, dyspnea and peripheral edema. ; No orthopnea, PND Respiratory: Negative for cough, hemoptysis, wheezing and stridor. No pleuritic chestpain. Gastrointestinal: Negative for nausea, vomiting, diarrhea, abdominal pain, melena, blood in stool, hematemesis, jaundice and rectal bleeding.    Genitourinary: Negative for frequency, dysuria, incontinence,flank pain and hematuria; Musculoskeletal: Negative for back pain and neck pain. Negative for swelling and trauma.;  Skin: . Negative for pruritus, rash, abrasions, bruising and skin lesion.; ulcerations Neuro:  Negative for headache, lightheadedness and neck stiffness. Negative for weakness, altered level of consciousness , altered mental status, extremity weakness, burning feet, involuntary movement, seizure and syncope.  Psych: negative for anxiety, depression, insomnia, tearfulness, panic attacks, hallucinations, paranoia, suicidal or homicidal ideation    Past Medical History  Diagnosis Date  . Arrhythmia     afib  . Hyperlipidemia   . Syncope and collapse   . Vertigo   . BPH (benign prostatic hyperplasia)   . Anxiety neurosis   . Atrial fib/flutter, transient     Past Surgical History  Procedure Date  . Appendectomy   . Pulse generator implant   . Interrogation     atrial and ventricular  . Enrhythm generator     implant of a medtronic generator  . Colonoscopy 10/20/2011    Procedure: COLONOSCOPY;  Surgeon: Malissa Hippo, MD;  Location: [facility];  Service: Endoscopy;  Laterality: N/A;    Medications:  HOME MEDS: Prior to Admission medications   Medication Sig Start Date End Date Taking? Authorizing Provider  alfuzosin (UROXATRAL) 10 MG 24 hr tablet Take 10 mg by mouth daily.  09/07/11  Yes Historical Provider, MD  amiodarone (PACERONE) 200 MG tablet Take 100 mg by mouth daily.    Yes Historical Provider, MD  bisacodyl (DULCOLAX) 5 MG EC tablet Take 5 mg by mouth daily as needed.   Yes Historical Provider, MD  latanoprost (XALATAN) 0.005 % ophthalmic solution Place 1 drop into the right eye at bedtime.   Yes Historical Provider, MD  levothyroxine (SYNTHROID, LEVOTHROID) 100 MCG tablet Take 100 mcg by mouth daily.   Yes Historical Provider, MD  lubiprostone (AMITIZA) 24 MCG capsule Take 24 mcg by mouth 2 (two) times daily with a meal.   Yes Historical Provider, MD  meclizine (ANTIVERT) 25 MG tablet Take 25 mg by mouth 3 (three) times daily as needed. Dizziness.   Yes Historical Provider, MD  megestrol (MEGACE ORAL) 40 MG/ML suspension Take 5 mLs (200 mg total) by mouth daily.  10/20/11  Yes Malissa Hippo, MD  midodrine (PROAMATINE) 5 MG tablet Take 5 mg by mouth 2 (two) times daily.   Yes Historical Provider, MD  nitroGLYCERIN (NITROSTAT) 0.4 MG SL tablet Place 0.4 mg under the tongue every 5 (five) minutes as needed. For chest pain.   Yes Historical Provider, MD  polyethylene glycol (MIRALAX / GLYCOLAX) packet Take 17 g by mouth daily.   Yes Historical Provider, MD  rosuvastatin (CRESTOR) 10 MG tablet Take 10 mg by mouth daily.   Yes Historical Provider, MD  warfarin (COUMADIN) 2.5 MG tablet Take 2.5 mg by mouth daily.   Yes Historical Provider, MD     Allergies:  No Known Allergies  Social History:   reports that he has never smoked. He does not have any smokeless tobacco history on file. He reports that he does not drink alcohol or use illicit drugs.  Family History: Family History  Problem Relation Age of Onset  . Cancer Mother   . Heart disease Father   . Cancer Sister      Physical Exam: Filed Vitals:   11/01/11 2250 11/01/11 2251 11/01/11 2256 11/01/11 2336  BP: 102/58 96/58 96/52    Pulse: 77 75 77   Temp:    99.4 F (37.4 C)  TempSrc:    Oral  Resp:      SpO2:       Blood pressure 96/52, pulse 77, temperature 99.4 F (37.4 C), temperature source Oral, resp. rate 20, SpO2 98.00%.  GEN:  Pleasant patient lying in the stretcher in no acute distress; cooperative with exam. PSYCH:  alert and oriented x4; does not appear anxious or depressed; affect is appropriate. HEENT: Mucous membranes pink and anicteric; PERRLA; EOM intact; no cervical lymphadenopathy nor thyromegaly or carotid bruit; no JVD; There were no stridor. Neck is very supple. Breasts:: Not examined CHEST WALL: No tenderness, ppm on left upper chest. CHEST: Normal respiration, clear to auscultation bilaterally.  HEART: Regular rate and rhythm.  There are no murmur, rub, or gallops.   BACK: No kyphosis or scoliosis; no CVA tenderness ABDOMEN: soft and non-tender; no masses, no  organomegaly, normal abdominal bowel sounds; no pannus; no intertriginous candida. There is no rebound and no distention. Rectal Exam: Not done EXTREMITIES: No bone or joint deformity; age-appropriate arthropathy of the hands and knees; no edema; no ulcerations.  There is no calf tenderness. Genitalia: not examined PULSES: 2+ and symmetric SKIN: Normal hydration no rash or ulceration CNS: Cranial nerves 2-12 grossly intact no focal lateralizing neurologic deficit.  Speech is fluent; uvula elevated with phonation, facial symmetry and tongue midline. DTR are normal bilaterally, cerebella exam is intact, barbinski is negative and strengths are equaled bilaterally.  No sensory loss.   Labs on Admission:  Basic Metabolic Panel:  Lab 11/01/11 1610  NA 135  K 4.1  CL 103  CO2 24  GLUCOSE 103*  BUN 24*  CREATININE 1.43*  CALCIUM 8.4  MG --  PHOS --   Liver Function Tests:  Lab 11/01/11 1825  AST 29  ALT 29  ALKPHOS 54  BILITOT 1.3*  PROT 6.4  ALBUMIN 3.3*   No results found for this basename: LIPASE:5,AMYLASE:5 in the last 168 hours No results found for this basename: AMMONIA:5 in the last 168 hours CBC:  Lab 11/01/11 1825  WBC 6.0  NEUTROABS 4.4  HGB 9.8*  HCT 28.5*  MCV 90.5  PLT 182   Cardiac Enzymes: No results found for this basename: CKTOTAL:5,CKMB:5,CKMBINDEX:5,TROPONINI:5 in the last 168 hours  CBG: No results found for this basename: GLUCAP:5 in the last 168 hours   Radiological Exams on Admission: Ct Head Wo Contrast  11/01/2011  *RADIOLOGY REPORT*  Clinical Data: Weakness.  CT HEAD WITHOUT CONTRAST  Technique:  Contiguous axial images were obtained from the base of the skull through the vertex without contrast.  Comparison: Head CT scan 02/28/2009.  Findings: Cortical atrophy and chronic microvascular ischemic change are again seen.  No evidence of acute abnormality including infarct, hemorrhage, mass lesion, mass effect, midline shift or abnormal extra-axial  fluid collection is identified.  There is no hydrocephalus or pneumocephalus.  The calvarium is intact.  IMPRESSION: No acute finding.  Stable compared to prior exam.   Original Report Authenticated By: Bernadene Bell. D'ALESSIO, M.D.    Dg Abd Acute W/chest  11/01/2011  *RADIOLOGY REPORT*  Clinical Data: Constipation for 10 weeks.  Abdominal tenderness.  ACUTE ABDOMEN SERIES (ABDOMEN 2 VIEW & CHEST 1 VIEW)  Comparison: 10/12/2011  Findings: Stable dual lead pacemaker from left subclavian approach. Cardiomegaly.  Low lung volumes with mild crowding of the markings at the bases but no focal infiltrates or failure.  Calcified tortuous aorta.  Moderate stool burden throughout the colon without visible obstruction or free air.  Dystrophic prostate calcifications. Degenerative change lumbar spine.  Scoliosis convex right. Osteopenia.  IMPRESSION: Constipation without visible obstruction or free air.  No active cardiopulmonary disease.   Original Report Authenticated By: Elsie Stain, M.D.     Assessment/Plan  :    DDx include depression, sideeffects of medication, ? Narcotic use, occult malignancy.   PLAN:  His problem is chronic and need full work up, but he should have outpatient work up.  I have recommended that he follows up with his PCP for further work up.  He may benefit getting an SSRI while simultaneously getting his work up.  I discussed this entirely with his family who agreed.  He doesn't have any acute illness requiring hospitalization.  He is stable and is a full code.    If he develops any acute decompensation, he will need to return to the ER.  I updated Dr Norlene Campbell of my consultation.  Other plans as per orders.  Code Status: FULL Unk Lightning, MD. [facility] Pager [phone] 7pm to 7am.  11/02/2011, 1:22 AM

## 2011-11-02 NOTE — ED Notes (Signed)
Patient is alert and oriented x3.  He was given DC instructions and follow up visit instructions.  Patient gave verbal understanding.  He was DC ambulatory under his own power to home.  V/S stable.  He was not showing any signs of distress on DC 

## 2011-11-03 NOTE — Telephone Encounter (Signed)
Patient was seen at [facility] ER.  Dr. Karilyn Cota had tried several times to reach [facility].  No answer.  Patient seen and released.

## 2011-11-26 ENCOUNTER — Ambulatory Visit (INDEPENDENT_AMBULATORY_CARE_PROVIDER_SITE_OTHER): Payer: Medicare Other | Admitting: Urology

## 2011-11-26 DIAGNOSIS — R82998 Other abnormal findings in urine: Secondary | ICD-10-CM

## 2011-11-26 DIAGNOSIS — N403 Nodular prostate with lower urinary tract symptoms: Secondary | ICD-10-CM

## 2012-02-01 ENCOUNTER — Ambulatory Visit (HOSPITAL_COMMUNITY)
Admission: RE | Admit: 2012-02-01 | Discharge: 2012-02-01 | Disposition: A | Discharge status: Home / Self Care | Payer: Medicare Other | Source: Ambulatory Visit | Attending: Cardiovascular Disease | Admitting: Cardiovascular Disease

## 2012-02-01 DIAGNOSIS — I10 Essential (primary) hypertension: Secondary | ICD-10-CM | POA: Insufficient documentation

## 2012-02-01 DIAGNOSIS — I4891 Unspecified atrial fibrillation: Secondary | ICD-10-CM | POA: Insufficient documentation

## 2012-02-01 DIAGNOSIS — R011 Cardiac murmur, unspecified: Secondary | ICD-10-CM | POA: Insufficient documentation

## 2012-02-01 DIAGNOSIS — R55 Syncope and collapse: Secondary | ICD-10-CM | POA: Insufficient documentation

## 2012-02-01 HISTORY — PX: DOPPLER ECHOCARDIOGRAPHY: SHX263

## 2012-02-01 NOTE — Progress Notes (Signed)
*  PRELIMINARY RESULTS* Echocardiogram 2D Echocardiogram has been performed.  Conrad Willowbrook 02/01/2012, 3:03 PM

## 2012-02-04 ENCOUNTER — Encounter (INDEPENDENT_AMBULATORY_CARE_PROVIDER_SITE_OTHER): Payer: Self-pay

## 2012-02-15 ENCOUNTER — Encounter (INDEPENDENT_AMBULATORY_CARE_PROVIDER_SITE_OTHER): Payer: Self-pay | Admitting: Internal Medicine

## 2012-02-15 ENCOUNTER — Ambulatory Visit (INDEPENDENT_AMBULATORY_CARE_PROVIDER_SITE_OTHER): Payer: Medicare Other | Admitting: Internal Medicine

## 2012-02-15 VITALS — BP 116/68 | HR 74 | Temp 98.7°F | Resp 18 | Ht 67.0 in | Wt 144.1 lb

## 2012-02-15 DIAGNOSIS — Z860101 Personal history of adenomatous and serrated colon polyps: Secondary | ICD-10-CM

## 2012-02-15 DIAGNOSIS — K5900 Constipation, unspecified: Secondary | ICD-10-CM

## 2012-02-15 DIAGNOSIS — R109 Unspecified abdominal pain: Secondary | ICD-10-CM

## 2012-02-15 DIAGNOSIS — R634 Abnormal weight loss: Secondary | ICD-10-CM

## 2012-02-15 DIAGNOSIS — Z86010 Personal history of colonic polyps: Secondary | ICD-10-CM

## 2012-02-15 DIAGNOSIS — R1030 Lower abdominal pain, unspecified: Secondary | ICD-10-CM

## 2012-02-15 MED ORDER — POLYETHYLENE GLYCOL 3350 17 G PO PACK: 17.0000 g | PACK | Freq: Every day | ORAL | Status: DC

## 2012-02-15 NOTE — Patient Instructions (Addendum)
High fiber diet(apples, prunes fruits and vegetables). Fiber supplement 3-4 g by mouth daily. Take polyethylene glycol 17 g by mouth daily. Can decrease dose to three fourths or one half if needed.

## 2012-02-15 NOTE — Progress Notes (Signed)
Presenting complaint;  Lower abdominal pain, weight loss and constipation.  Subjective:  Patient is 79 year old Caucasian male who is here for scheduled visit accompanied by his wife. Following this last office visit in October 2013 for weight loss and constipation, he underwent a colonoscopy with removal of large tubular adenoma from cecum. He also underwent chest and in head CT. Chest CT revealed 6 mm calcified granuloma in head CT was negative for acute findings. His wife remains worried about him. She states he eats junk food all the time. He apparently does not have taste for foods that he used to. He does not report any change in his taste since amiodarone dose was dropped. He has not lost any weight since his last visit. He complains of lower abdominal pain which is always relieved with bowel movement. He denies melena or rectal bleeding. His wife wants the review CT scan that he had on 02/07/2012 for abdominal pain. She brought along the disc.  Current Medications: Current Outpatient Prescriptions  Medication Sig Dispense Refill  . amiodarone (PACERONE) 200 MG tablet Take 100 mg by mouth daily.       . bisacodyl (DULCOLAX) 5 MG EC tablet Take 5 mg by mouth daily as needed.      . latanoprost (XALATAN) 0.005 % ophthalmic solution Place 1 drop into the right eye at bedtime.      Marland Kitchen levothyroxine (SYNTHROID, LEVOTHROID) 100 MCG tablet Take 100 mcg by mouth daily.      . midodrine (PROAMATINE) 5 MG tablet Take 5 mg by mouth 2 (two) times daily.      . nitroGLYCERIN (NITROSTAT) 0.4 MG SL tablet Place 0.4 mg under the tongue every 5 (five) minutes as needed. For chest pain.      . rosuvastatin (CRESTOR) 10 MG tablet Take 10 mg by mouth daily.      Marland Kitchen warfarin (COUMADIN) 2.5 MG tablet Take 2.5 mg by mouth daily.       No current facility-administered medications for this visit.     Objective: Blood pressure 116/68, pulse 74, temperature 98.7 F (37.1 C), temperature source Oral, resp. rate  18, height 5\' 7"  (1.702 m), weight 144 lb 1.6 oz (65.363 kg). Patient is alert and in no acute distress. Conjunctiva is pink. Sclera is nonicteric Oropharyngeal mucosa is normal. No neck masses or thyromegaly noted. Abdomen. No bruits noted. Abdomen is soft and nontender without organomegaly or masses. No LE edema or clubbing noted.  Labs/studies Results: Abdominopelvic CT from 02/07/2012 reviewed it is accessible via Epic. No abnormality noted to liver spleen pancreas and adrenal glands. Chronic calcification along the right hemidiaphragm. Celiac trunk and SMA appear to be patent. He has enlarged prostate with calcification  Assessment:  #1. Weight loss. Patient has not lost any weight in the last 4 months. Will continue to monitor him closely. #2. Constipation.Secondary to colonic dysmotility and poor eating habits. #3. History of cecal tubular adenoma. Last colonoscopy was 4 months ago with piecemeal polypectomy. He will need followup colonoscopy in October 2014.   Plan:  Patient advised to continue eating fiber rich foods. Fiber supplement 3-4 g by mouth daily. Take polyethylene glycol 17 g by mouth daily. Use bisacodyl only on when necessary basis. Office visit in 3 months. Next colonoscopy in October 2014.

## 2012-02-19 DIAGNOSIS — Z86010 Personal history of colonic polyps: Secondary | ICD-10-CM | POA: Insufficient documentation

## 2012-02-19 DIAGNOSIS — Z860101 Personal history of adenomatous and serrated colon polyps: Secondary | ICD-10-CM | POA: Insufficient documentation

## 2012-04-21 ENCOUNTER — Encounter: Payer: Self-pay | Admitting: Pharmacist Clinician (PhC)/ Clinical Pharmacy Specialist

## 2012-04-21 DIAGNOSIS — Z7901 Long term (current) use of anticoagulants: Secondary | ICD-10-CM

## 2012-04-21 DIAGNOSIS — I4891 Unspecified atrial fibrillation: Secondary | ICD-10-CM

## 2012-04-24 ENCOUNTER — Encounter: Payer: Self-pay | Admitting: *Deleted

## 2012-05-15 ENCOUNTER — Encounter (INDEPENDENT_AMBULATORY_CARE_PROVIDER_SITE_OTHER): Payer: Self-pay | Admitting: Internal Medicine

## 2012-05-15 ENCOUNTER — Ambulatory Visit (INDEPENDENT_AMBULATORY_CARE_PROVIDER_SITE_OTHER): Payer: Medicare Other | Admitting: Internal Medicine

## 2012-05-15 VITALS — BP 128/82 | HR 76 | Temp 97.6°F | Resp 18 | Ht 67.0 in | Wt 144.2 lb

## 2012-05-15 DIAGNOSIS — K5900 Constipation, unspecified: Secondary | ICD-10-CM

## 2012-05-15 DIAGNOSIS — R634 Abnormal weight loss: Secondary | ICD-10-CM

## 2012-05-15 MED ORDER — BISACODYL 5 MG PO TBEC: 5.0000 mg | DELAYED_RELEASE_TABLET | Freq: Every day | ORAL | Status: DC

## 2012-05-15 MED ORDER — BISACODYL 5 MG PO TBEC: 10.0000 mg | DELAYED_RELEASE_TABLET | Freq: Every day | ORAL | Status: DC

## 2012-05-15 MED ORDER — PSYLLIUM 28 % PO PACK: 1.0000 | PACK | Freq: Every day | ORAL | Status: DC

## 2012-05-15 NOTE — Progress Notes (Signed)
Presenting complaint;  Followup for constipation and weight loss.  Subjective:  Patient is an 79 year-old Caucasian male who is here for scheduled visit accompanied by his wife. He was last seen 3 months ago. He has no complaints. He feels he is doing well but his wife does not agree with him. He has craving for sweets. His wife states he eats too many candies. He was briefly admitted at [facility] last week. He was given an enema last Friday night and had some results. He has not taken any laxatives since he was discharged. He says the only medication that provides relieve his Dulcolax. He does not experience any side effects. He believes his appetite is fair. He has not lost any weight since October 2013. He denies fever chills or night sweats.  Current Medications: Current Outpatient Prescriptions  Medication Sig Dispense Refill  . amiodarone (PACERONE) 200 MG tablet Take 100 mg by mouth daily.       . bisacodyl (DULCOLAX) 5 MG EC tablet Take 5 mg by mouth 2 (two) times daily.       . cefUROXime (CEFTIN) 250 MG tablet 250 mg 2 (two) times daily.       Marland Kitchen latanoprost (XALATAN) 0.005 % ophthalmic solution Place 1 drop into the right eye at bedtime.      Marland Kitchen levothyroxine (SYNTHROID, LEVOTHROID) 100 MCG tablet Take 125 mcg by mouth daily.       . midodrine (PROAMATINE) 5 MG tablet Take 5 mg by mouth 2 (two) times daily.      . nitroGLYCERIN (NITROSTAT) 0.4 MG SL tablet Place 0.4 mg under the tongue every 5 (five) minutes as needed. For chest pain.      . polyethylene glycol (MIRALAX / GLYCOLAX) packet Take 17 g by mouth daily.  14 each    . rosuvastatin (CRESTOR) 10 MG tablet Take 10 mg by mouth daily.      Marland Kitchen warfarin (COUMADIN) 2.5 MG tablet Take 2.5 mg by mouth daily.       No current facility-administered medications for this visit.     Objective: Blood pressure 128/82, pulse 76, temperature 97.6 F (36.4 C), temperature source Oral, resp. rate 18, height 5\' 7"  (1.702 m), weight 144 lb 3.2 oz  (65.409 kg). Patient is alert and appears to be comfortable. Conjunctiva is pink. Sclera is nonicteric Oropharyngeal mucosa is normal. No neck masses or thyromegaly noted. Abdomen is soft and nontender without organomegaly or masses. No LE edema or clubbing noted.    Assessment:  #1. Chronic constipation. He possibly has colonic dysmotility made worse with his medications and eating habits. As long as OTC laxative works he can stay with it. He must increase consumption of fiber rich foods. #2. Weight loss. Workup last year was negative including chest and abdominopelvic CT and a colonoscopy which revealed tube or adenoma.   Plan:  Discontinue polyethylene glycol. Continue Dulcolax 2 tablets daily at bedtime. Metamucil 4 g by mouth each bedtime. Use Dulcolax suppository or Fleet enema on when necessary basis. Office visit in 6 months.

## 2012-05-15 NOTE — Patient Instructions (Signed)
High fiber diet. You must take Metamucil daily as recommended. Can use Dulcolax suppository or a Fleet Enema on as-needed basis.

## 2012-05-25 ENCOUNTER — Other Ambulatory Visit: Payer: Self-pay

## 2012-05-25 MED ORDER — ROSUVASTATIN CALCIUM 10 MG PO TABS: 10.0000 mg | ORAL_TABLET | Freq: Every day | ORAL | Status: DC

## 2012-05-25 NOTE — Telephone Encounter (Signed)
Rx for Dr. Alanda Amass. Was printed, will have dr sign when he's back in office.

## 2012-06-02 ENCOUNTER — Ambulatory Visit: Payer: Medicare Other | Admitting: Urology

## 2012-06-14 ENCOUNTER — Ambulatory Visit: Payer: Medicare Other

## 2012-08-11 LAB — PACEMAKER DEVICE OBSERVATION

## 2012-08-16 ENCOUNTER — Encounter: Payer: Self-pay | Admitting: Cardiovascular Disease

## 2012-09-18 ENCOUNTER — Telehealth: Payer: Self-pay | Admitting: Pharmacist Clinician (PhC)/ Clinical Pharmacy Specialist

## 2012-09-18 NOTE — Telephone Encounter (Signed)
Overdue INR letter sent 

## 2012-09-22 ENCOUNTER — Ambulatory Visit (INDEPENDENT_AMBULATORY_CARE_PROVIDER_SITE_OTHER): Payer: Medicare Other | Admitting: Urology

## 2012-09-22 ENCOUNTER — Other Ambulatory Visit: Payer: Self-pay | Admitting: Urology

## 2012-09-22 DIAGNOSIS — N138 Other obstructive and reflux uropathy: Secondary | ICD-10-CM

## 2012-09-22 DIAGNOSIS — N411 Chronic prostatitis: Secondary | ICD-10-CM

## 2012-09-22 DIAGNOSIS — R82998 Other abnormal findings in urine: Secondary | ICD-10-CM

## 2012-09-27 ENCOUNTER — Ambulatory Visit (HOSPITAL_COMMUNITY)
Admission: RE | Admit: 2012-09-27 | Discharge: 2012-09-27 | Disposition: A | Discharge status: Home / Self Care | Payer: Medicare Other | Source: Ambulatory Visit | Attending: Urology | Admitting: Urology

## 2012-09-27 DIAGNOSIS — N411 Chronic prostatitis: Secondary | ICD-10-CM | POA: Insufficient documentation

## 2012-09-27 DIAGNOSIS — N281 Cyst of kidney, acquired: Secondary | ICD-10-CM | POA: Insufficient documentation

## 2012-10-30 ENCOUNTER — Telehealth: Payer: Self-pay | Admitting: *Deleted

## 2012-10-30 NOTE — Telephone Encounter (Signed)
LMOM for dtr Misty Stanley to call back and let me know who his cardiologist will be now that Dr. Alanda Amass has retired.  Will review with that MD

## 2012-10-30 NOTE — Telephone Encounter (Signed)
Pt keeps falling and has fell 4 times on his head. His daughter took him to get CT scan and the neurologist wanted him to come off coumadin and his daughter has taken him off of it now for 2 weeks. She wants to know if there is something else that he can take due to him having swelling on his brain.

## 2012-11-02 ENCOUNTER — Telehealth: Payer: Self-pay | Admitting: Cardiovascular Disease

## 2012-11-02 NOTE — Telephone Encounter (Signed)
See other phone note

## 2012-11-02 NOTE — Telephone Encounter (Signed)
Returning Reginald [name]  Wants Reginald [name] to [name] her regarding coumadin.

## 2012-11-02 NOTE — Telephone Encounter (Signed)
Spoke with Reginald [name], pt has appt with Dr. Royann Shivers on 11/12.  Advised that he should continue to hold warfarin until that time.  Has been off >3 wks alrealdy.  Dtr states no recent falls, however pt is not going outside as much.  Dtr voiced understanding

## 2012-11-08 ENCOUNTER — Telehealth: Payer: Self-pay | Admitting: Internal Medicine

## 2012-11-08 NOTE — Telephone Encounter (Signed)
Pt has appt 11-15-12 with dr C/MT

## 2012-11-14 ENCOUNTER — Encounter (INDEPENDENT_AMBULATORY_CARE_PROVIDER_SITE_OTHER): Payer: Self-pay | Admitting: Internal Medicine

## 2012-11-14 ENCOUNTER — Ambulatory Visit (INDEPENDENT_AMBULATORY_CARE_PROVIDER_SITE_OTHER): Payer: Medicare Other | Admitting: Internal Medicine

## 2012-11-14 VITALS — BP 106/68 | HR 72 | Temp 97.0°F | Resp 18 | Ht 67.0 in | Wt 138.7 lb

## 2012-11-14 DIAGNOSIS — R634 Abnormal weight loss: Secondary | ICD-10-CM

## 2012-11-14 DIAGNOSIS — K5900 Constipation, unspecified: Secondary | ICD-10-CM

## 2012-11-14 NOTE — Patient Instructions (Signed)
Use Dulcolax suppository this evening and if it does not work and take [employer] High-fiber diet. Please remember to take your laxative daily if you can.

## 2012-11-14 NOTE — Progress Notes (Signed)
Presenting complaint;  Constipation and weight loss.  Subjective:  Patient is a [age]-year-old Caucasian male who presents for scheduled visit. He has history of weight loss with negative workup and history of constipation. He was last seen 6 months ago. Today he is accompanied by his niece as his daughter was unable to accompany him.  patient states he has not been taking his laxatives lately and he has been constipated however he denies nausea vomiting abdominal pain melena or rectal bleeding. He is extremely upset about family issues. He states his wife had the car keys and then she called police twice and wanted him to be committed and then filed divorce. He is now staying with his daughter. He has had problems with imbalance. 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 Olena Leatherwood. His appetite is fair. He has lost almost 60 pounds since his last visit. His knees states that he actually has gained a couple of pounds since he has been living with his daughter.  Current Medications: Current Outpatient Prescriptions  Medication Sig Dispense Refill  . amiodarone (PACERONE) 200 MG tablet Take 100 mg by mouth daily.       Marland Kitchen donepezil (ARICEPT) 10 MG tablet       . latanoprost (XALATAN) 0.005 % ophthalmic solution Place 1 drop into the right eye at bedtime.      Marland Kitchen levofloxacin (LEVAQUIN) 500 MG tablet       . levothyroxine (SYNTHROID, LEVOTHROID) 100 MCG tablet Take 125 mcg by mouth daily.       . midodrine (PROAMATINE) 5 MG tablet Take 5 mg by mouth 2 (two) times daily.      . nitroGLYCERIN (NITROSTAT) 0.4 MG SL tablet Place 0.4 mg under the tongue every 5 (five) minutes as needed. For chest pain.      Marland Kitchen permethrin (ELIMITE) 5 % cream       . rosuvastatin (CRESTOR) 10 MG tablet Take 1 tablet (10 mg total) by mouth daily.  30 tablet  0  . travoprost, benzalkonium, (TRAVATAN) 0.004 % ophthalmic solution       .  triamcinolone lotion (KENALOG) 0.1 %       . bisacodyl (DULCOLAX) 5 MG EC tablet Take 2 tablets (10 mg total) by mouth at bedtime.  30 tablet    . cefUROXime (CEFTIN) 250 MG tablet 250 mg 2 (two) times daily.       . psyllium (METAMUCIL SMOOTH TEXTURE) 28 % packet Take 1 packet by mouth at bedtime.      Marland Kitchen warfarin (COUMADIN) 2.5 MG tablet Take 2.5 mg by mouth daily.       No current facility-administered medications for this visit.     Objective: Blood pressure 106/68, pulse 72, temperature 97 F (36.1 C), temperature source Oral, resp. rate 18, height 5\' 7"  (1.702 m), weight 138 lb 11.2 oz (62.914 kg). Patient is alert and in no acute distress but quite upset. He is having difficulty with expressing himself. Conjunctiva is pink. Sclera is nonicteric Oropharyngeal mucosa is normal. No neck masses or thyromegaly noted. Abdomen is flat and soft without tenderness organomegaly or masses. No LE edema or clubbing noted.    Assessment:  #1. Constipation secondary to colonic motility disorder. He is laxative dependent. He underwent colonoscopy in October 2013 with removal of large cecal adenoma. #2. Weight loss secondary to diminished calorie intake as a result of marital problems. Now that he  is living with his daughter he is eating better and hopefully weight go up.    Plan:  Patient reminded to go back on high fiber diet. Continue OTC laxative on a regular basis. Use Dulcolax suppository or Fleet Enema every third day as needed. Office visit in 6 months.

## 2012-11-15 ENCOUNTER — Encounter: Payer: Self-pay | Admitting: Cardiovascular Disease

## 2012-11-15 ENCOUNTER — Ambulatory Visit (INDEPENDENT_AMBULATORY_CARE_PROVIDER_SITE_OTHER): Payer: Medicare Other | Admitting: Cardiovascular Disease

## 2012-11-15 VITALS — BP 138/62 | HR 80 | Resp 16 | Ht 66.5 in | Wt 139.0 lb

## 2012-11-15 DIAGNOSIS — R5381 Other malaise: Secondary | ICD-10-CM

## 2012-11-15 DIAGNOSIS — Z79899 Other long term (current) drug therapy: Secondary | ICD-10-CM

## 2012-11-15 DIAGNOSIS — Z950 Presence of cardiac pacemaker: Secondary | ICD-10-CM

## 2012-11-15 DIAGNOSIS — I951 Orthostatic hypotension: Secondary | ICD-10-CM

## 2012-11-15 DIAGNOSIS — I4891 Unspecified atrial fibrillation: Secondary | ICD-10-CM

## 2012-11-15 DIAGNOSIS — R296 Repeated falls: Secondary | ICD-10-CM

## 2012-11-15 DIAGNOSIS — Z9181 History of falling: Secondary | ICD-10-CM

## 2012-11-15 LAB — PACEMAKER DEVICE OBSERVATION

## 2012-11-15 NOTE — Assessment & Plan Note (Addendum)
His overall burden of atrial fibrillation is quite low and each individual event is very brief, less than 5 minutes). He has never had a stroke or other embolic event. Overall, at this point in his life, the risk of bleeding complications and serious injury it appears to outweigh the risk of embolic events. I agree with the decision to hold his warfarin and I would not restart it unless there is a major change in his clinical situation. Prolonged episodes of persistent atrial fibrillation and/or confirmed embolic events may change this opinion. Check thyroid and liver function tests at least twice yearly; he is due for those now.

## 2012-11-15 NOTE — Assessment & Plan Note (Signed)
Normal pacemaker function. He has essentially 100% atrial pacing and no ventricular pacing. He has had numerous episodes of atrial mode switch secondary to atrial fibrillation, but all these episodes are very brief. Most of them last for less than a minute, the longest one was less than 5 minutes in duration. The overall burden of atrial for relation is 0.1%. Rate control during the episodes of atrial arrhythmia is acceptable. Committed device longevity 2-4 years. Will recommend aspirin for stroke prevention is a safer alternative to warfarin. We discussed remote pacemaker monitoring he would perform a download in 3 months. We'll see him back in the office in 6 months for an in office comprehensive check.

## 2012-11-15 NOTE — Assessment & Plan Note (Signed)
He has been taking this medication twice daily first in the morning and lasting at night, which is not appropriate. This medication should only be given when the patient is expected to be upright and this was discussed with the patient and his daughter today. I recommend that he take a dose of the medication first thing in the morning before he even gets out of bed and a second dose roughly 4 hours later. He should not life fully recumbent within 2 hours of taking this medication since it may cause significant supine hypertension.

## 2012-11-15 NOTE — Progress Notes (Signed)
Patient ID: Reginald [name], male   DOB: [birth year], 79 y.o.   MRN: [id number]      Reason for office visit Atrial fibrillation, pacemaker check  This is the first time I met Reginald [name], who was previously cared for by Dr. Susa Griffins , until his recent retirement.  Reginald [name] does not have significant coronary disease and has preserved left ventricular systolic function but has a long-standing history of recurrent paroxysmal atrial fibrillation. This is gently been well kept and check on treatment with low-dose amiodarone. He has been on warfarin for years but does not have a history of stroke TIA or other embolic events. His wife he used to take care of his medications but they're nontender process of getting a divorce. Reginald [name] has evidence of slowly progressive dementia and has moved in with his daughter. He has become increasingly unsteady on his feet and has had numerous falls including a very severe injury to his head. Luckily he did not develop an intracranial hemorrhage. His warfarin was temporarily stopped, but I think we should stop it permanently unless there is some new development in his health.  He has a normally functioning dual-chamber permanent pacemaker. This is a Medtronic EN rhythm device that sometimes has issues with rapid battery depletion. At this point there is no evidence of such an issue and he has an expected 2-4 years left on this generator. The initial device was implanted in 2001 and he still has the original leads.  He has a history of orthostatic hypotension and was prescribed ProAmatine. Unfortunately he was taking this medicine on a twice-daily basis with a second dose being given just before he went to bed which is actually contraindicated. He has been on a neuronal for a long time. His daughter has inadvertently been giving him twice the dose that had been prescribed by Dr. Alanda Amass (200 mg of 100 mg daily) but he does not appear to have any side effects from  this.   No Known Allergies  Current Outpatient Prescriptions  Medication Sig Dispense Refill  . amiodarone (PACERONE) 200 MG tablet Take 200 mg by mouth daily.       . bisacodyl (DULCOLAX) 5 MG EC tablet Take 2 tablets (10 mg total) by mouth at bedtime.  30 tablet    . cefUROXime (CEFTIN) 250 MG tablet 250 mg 2 (two) times daily.       Marland Kitchen donepezil (ARICEPT) 10 MG tablet       . latanoprost (XALATAN) 0.005 % ophthalmic solution Place 1 drop into the right eye at bedtime.      Marland Kitchen levothyroxine (SYNTHROID, LEVOTHROID) 100 MCG tablet Take 150 mcg by mouth daily.       . midodrine (PROAMATINE) 5 MG tablet Take 5 mg by mouth 2 (two) times daily. Take one tablet in the morning then take the second dose 4 hours later (early afternoon).      . nitroGLYCERIN (NITROSTAT) 0.4 MG SL tablet Place 0.4 mg under the tongue every 5 (five) minutes as needed. For chest pain.      Marland Kitchen permethrin (ELIMITE) 5 % cream       . psyllium (METAMUCIL SMOOTH TEXTURE) 28 % packet Take 1 packet by mouth at bedtime.      . rosuvastatin (CRESTOR) 10 MG tablet Take 1 tablet (10 mg total) by mouth daily.  30 tablet  0  . travoprost, benzalkonium, (TRAVATAN) 0.004 % ophthalmic solution       . triamcinolone lotion (KENALOG)  0.1 %       . warfarin (COUMADIN) 2.5 MG tablet Take 2.5 mg by mouth daily.       No current facility-administered medications for this visit.    Past Medical History  Diagnosis Date  . Arrhythmia     afib  . Hyperlipidemia   . Syncope and collapse   . Vertigo   . BPH (benign prostatic hyperplasia)   . Anxiety neurosis   . Atrial fib/flutter, transient     Past Surgical History  Procedure Laterality Date  . Appendectomy    . Pulse generator implant    . Interrogation      atrial and ventricular  . Enrhythm generator      implant of a medtronic generator  . Colonoscopy  10/20/2011    Procedure: COLONOSCOPY;  Surgeon: Malissa Hippo, MD;  Location: [facility];  Service: Endoscopy;   Laterality: N/A;    Family History  Problem Relation Age of Onset  . Cancer Mother   . Heart disease Father   . Cancer Sister     History   Social History  . Marital Status: Married    Spouse Name: N/A    Number of Children: N/A  . Years of Education: N/A   Occupational History  . Not on file.   Social History Main Topics  . Smoking status: Never Smoker   . Smokeless tobacco: Never Used  . Alcohol Use: No  . Drug Use: No  . Sexual Activity: Not on file   Other Topics Concern  . Not on file   Social History Narrative  . No narrative on file    Review of systems: He has become very wary and rarely ventures out doors by himself since he is afraid of falling. His memory is moderately impaired. The patient specifically denies any chest pain at rest or with exertion, dyspnea at rest or with exertion, orthopnea, paroxysmal nocturnal dyspnea, syncope, palpitations, focal neurological deficits, intermittent claudication, lower extremity edema, unexplained weight gain, cough, hemoptysis or wheezing.  The patient also denies abdominal pain, nausea, vomiting, dysphagia, diarrhea, constipation, polyuria, polydipsia, dysuria, hematuria, frequency, urgency, abnormal bleeding or bruising, fever, chills, unexpected weight changes, mood swings, change in skin or hair texture, change in voice quality, auditory or visual problems, allergic reactions or rashes, new musculoskeletal complaints other than usual "aches and pains".   PHYSICAL EXAM BP 138/62  Pulse 80  Resp 16  Ht 5' 6.5" (1.689 m)  Wt 139 lb (63.05 kg)  BMI 22.10 kg/m2  General: Alert, oriented x3, no distress Head: no evidence of trauma, PERRL, EOMI, no exophtalmos or lid lag, no myxedema, no xanthelasma; normal ears, nose and oropharynx Neck: normal jugular venous pulsations and no hepatojugular reflux; brisk carotid pulses without delay and no carotid bruits Chest: clear to auscultation, no signs of consolidation by  percussion or palpation, normal fremitus, symmetrical and full respiratory excursions left subclavian pacemaker site appears healthy Cardiovascular: normal position and quality of the apical impulse, regular rhythm, normal first and second heart sounds, no murmurs, rubs or gallops Abdomen: no tenderness or distention, no masses by palpation, no abnormal pulsatility or arterial bruits, normal bowel sounds, no hepatosplenomegaly Extremities: no clubbing, cyanosis or edema; 2+ radial, ulnar and brachial pulses bilaterally; 2+ right femoral, posterior tibial and dorsalis pedis pulses; 2+ left femoral, posterior tibial and dorsalis pedis pulses; no subclavian or femoral bruits Neurological: grossly nonfocal   EKG: Atrial paced ventricular sensed, early RS transition with a tall  R wave in lead V2, consider old posterior infarction   May 2014 creatinine 2.1 hemoglobin 12.2  BMET    Component Value Date/Time   NA 135 11/01/2011 1825   K 4.1 11/01/2011 1825   CL 103 11/01/2011 1825   CO2 24 11/01/2011 1825   GLUCOSE 103* 11/01/2011 1825   BUN 24* 11/01/2011 1825   CREATININE 1.43* 11/01/2011 1825   CALCIUM 8.4 11/01/2011 1825   GFRNONAA 43* 11/01/2011 1825   GFRAA 50* 11/01/2011 1825     ASSESSMENT AND PLAN ATRIAL FIBRILLATION His overall burden of atrial fibrillation is quite low and each individual event is very brief, less than 5 minutes). He has never had a stroke or other embolic event. Overall, at this point in his life, the risk of bleeding complications and serious injury it appears to outweigh the risk of embolic events. I agree with the decision to hold his warfarin and I would not restart it unless there is a major change in his clinical situation. Prolonged episodes of persistent atrial fibrillation and/or confirmed embolic events may change this opinion. Check thyroid and liver function tests at least twice yearly; he is due for those now.  ORTHOSTATIC HYPOTENSION He has been  taking this medication twice daily first in the morning and lasting at night, which is not appropriate. This medication should only be given when the patient is expected to be upright and this was discussed with the patient and his daughter today. I recommend that he take a dose of the medication first thing in the morning before he even gets out of bed and a second dose roughly 4 hours later. He should not life fully recumbent within 2 hours of taking this medication since it may cause significant supine hypertension.  Cardiac pacemaker in situ Normal pacemaker function. He has essentially 100% atrial pacing and no ventricular pacing. He has had numerous episodes of atrial mode switch secondary to atrial fibrillation, but all these episodes are very brief. Most of them last for less than a minute, the longest one was less than 5 minutes in duration. The overall burden of atrial for relation is 0.1%. Rate control during the episodes of atrial arrhythmia is acceptable. Committed device longevity 2-4 years. Will recommend aspirin for stroke prevention is a safer alternative to warfarin. We discussed remote pacemaker monitoring he would perform a download in 3 months. We'll see him back in the office in 6 months for an in office comprehensive check.  Falls frequently His gait has become increasingly unsteady. He has fallen at least 6 times the last couple of months. Some of these falls are clearly mechanical, such as when he tripped over a rock. None are clearly related to orthostatic hypotension although this is a possibility. I have recommended that he have a physical therapy evaluation. I ordered a rolling walker for him but would be good to have the physical therapist instruct him in its appropriate use strongly recommend that he remain physically active since weak leg muscles may be a big part of his gait instability.   Orders Placed This Encounter  Procedures  . For home use only DME 4 wheeled  rolling walker with seat  . TSH  . Comp Met (CMET)  . PT evaluation  . EKG 12-Lead    Delane Stalling  Thurmon Fair, MD, [facility] [phone] office [phone] pager

## 2012-11-15 NOTE — Patient Instructions (Addendum)
Remote monitoring is used to monitor your Pacemaker of ICD from home. This monitoring reduces the number of office visits required to check your device to one time per year. It allows [place] to keep an eye on the functioning of your device to ensure it is working properly. You are scheduled for a device check from home on 02-16-2013. You may send your transmission at any time that day. If you have a wireless device, the transmission will be sent automatically. After your physician reviews your transmission, you will receive a postcard with your next transmission date.   DECREASE Amiodarone to 100mg  daily ( 1/2 tablet of 200mg  tabs).  STAY OFF COUMADIN.  Please have blood drawn prior to your next appointment.  Your physician recommends that you schedule a follow-up appointment in: 6 months

## 2012-11-15 NOTE — Assessment & Plan Note (Signed)
His gait has become increasingly unsteady. He has fallen at least 6 times the last couple of months. Some of these falls are clearly mechanical, such as when he tripped over a rock. None are clearly related to orthostatic hypotension although this is a possibility. I have recommended that he have a physical therapy evaluation. I ordered a rolling walker for him but would be good to have the physical therapist instruct him in its appropriate use strongly recommend that he remain physically active since weak leg muscles may be a big part of his gait instability.

## 2012-11-16 LAB — MDC_IDC_ENUM_SESS_TYPE_INCLINIC
Battery Voltage: 2.9 V
Brady Statistic RA Percent Paced: 98.8 %
Lead Channel Impedance Value: 288 Ohm
Lead Channel Impedance Value: 448 Ohm
Lead Channel Pacing Threshold Amplitude: 1 V
Lead Channel Pacing Threshold Pulse Width: 0.4 ms
Lead Channel Sensing Intrinsic Amplitude: 1.7 mV
Lead Channel Sensing Intrinsic Amplitude: 3.1 mV
Lead Channel Setting Pacing Amplitude: 2 V
Lead Channel Setting Pacing Amplitude: 2 V
Lead Channel Setting Pacing Pulse Width: 0.4 ms
Lead Channel Setting Sensing Sensitivity: 0.9 mV

## 2012-11-24 ENCOUNTER — Telehealth: Payer: Self-pay | Admitting: Cardiovascular Disease

## 2012-11-24 NOTE — Telephone Encounter (Signed)
Returned call.  Left message to call back on Monday if message not received before 5pm.

## 2012-11-24 NOTE — Telephone Encounter (Signed)
Returned call to Amy w/ [facility] (PT).  Stated she just called to inform MD pt fell last night and he is all right.  Denied injuries.  Stated policy is to inform MD.  Message forwarded to Dr. Royann Shivers.  Of note, pt has documented h/o falling and PT order for this reason.  See last OV note.

## 2012-11-24 NOTE — Telephone Encounter (Signed)
Has a medication question, but does not know the name of the medication.

## 2012-11-24 NOTE — Telephone Encounter (Signed)
Reginald [name] fell last night but he is ok was able to get up by himself, he was with his daughter and reports no pain and no injuries .Marland Kitchen Just letting Dr.Croitoru know that he fell.   Thanks

## 2012-11-27 ENCOUNTER — Telehealth: Payer: Self-pay | Admitting: Cardiovascular Disease

## 2012-11-27 NOTE — Telephone Encounter (Signed)
Dr Raw prescribed Aminodarone 1 1/2 pills   RX is now different from what Dr C saying  Please call  Tried to get this clarified on Friday.  Says to leave msg on cell phone if you dont get her as to exact dosage he should be taking.

## 2012-11-27 NOTE — Telephone Encounter (Signed)
Rerturning your call.

## 2012-11-27 NOTE — Telephone Encounter (Signed)
Returned call.  Left message that dose decreased to 100 mg (1/2 of 200 mg tab) daily and to call back before 4pm if questions.

## 2012-11-27 NOTE — Telephone Encounter (Signed)
Returned call.  Misty Stanley stated she received the message and pt has been taking 1/2 of the 200mg  tab since he was seen and everything is fine.

## 2012-12-07 ENCOUNTER — Telehealth: Payer: Self-pay | Admitting: *Deleted

## 2012-12-07 NOTE — Telephone Encounter (Signed)
Signed orders for PT faxed.

## 2013-01-25 ENCOUNTER — Ambulatory Visit: Payer: Self-pay | Admitting: Pharmacist Clinician (PhC)/ Clinical Pharmacy Specialist

## 2013-01-25 DIAGNOSIS — Z7901 Long term (current) use of anticoagulants: Secondary | ICD-10-CM

## 2013-01-25 DIAGNOSIS — I4891 Unspecified atrial fibrillation: Secondary | ICD-10-CM

## 2013-02-12 ENCOUNTER — Inpatient Hospital Stay (HOSPITAL_COMMUNITY)
Admit: 2013-02-12 | Discharge: 2013-02-22 | DRG: 026 | Disposition: A | Discharge status: Skilled Nursing Facility | Payer: Medicare Other | Source: Other Acute Inpatient Hospital | Attending: Neurosurgery | Admitting: Neurosurgery

## 2013-02-12 DIAGNOSIS — I499 Cardiac arrhythmia, unspecified: Secondary | ICD-10-CM | POA: Diagnosis not present

## 2013-02-12 DIAGNOSIS — I951 Orthostatic hypotension: Secondary | ICD-10-CM | POA: Diagnosis present

## 2013-02-12 DIAGNOSIS — E039 Hypothyroidism, unspecified: Secondary | ICD-10-CM | POA: Diagnosis present

## 2013-02-12 DIAGNOSIS — G9389 Other specified disorders of brain: Secondary | ICD-10-CM | POA: Diagnosis not present

## 2013-02-12 DIAGNOSIS — F3289 Other specified depressive episodes: Secondary | ICD-10-CM | POA: Diagnosis present

## 2013-02-12 DIAGNOSIS — I4892 Unspecified atrial flutter: Secondary | ICD-10-CM | POA: Diagnosis present

## 2013-02-12 DIAGNOSIS — Z79899 Other long term (current) drug therapy: Secondary | ICD-10-CM

## 2013-02-12 DIAGNOSIS — I10 Essential (primary) hypertension: Secondary | ICD-10-CM | POA: Diagnosis present

## 2013-02-12 DIAGNOSIS — S065X9A Traumatic subdural hemorrhage with loss of consciousness of unspecified duration, initial encounter: Secondary | ICD-10-CM | POA: Diagnosis present

## 2013-02-12 DIAGNOSIS — S065XAA Traumatic subdural hemorrhage with loss of consciousness status unknown, initial encounter: Principal | ICD-10-CM

## 2013-02-12 DIAGNOSIS — Z950 Presence of cardiac pacemaker: Secondary | ICD-10-CM

## 2013-02-12 DIAGNOSIS — F411 Generalized anxiety disorder: Secondary | ICD-10-CM | POA: Diagnosis present

## 2013-02-12 DIAGNOSIS — Z01818 Encounter for other preprocedural examination: Secondary | ICD-10-CM

## 2013-02-12 DIAGNOSIS — Z860100 Personal history of colon polyps, unspecified: Secondary | ICD-10-CM

## 2013-02-12 DIAGNOSIS — E860 Dehydration: Secondary | ICD-10-CM | POA: Diagnosis present

## 2013-02-12 DIAGNOSIS — F0390 Unspecified dementia without behavioral disturbance: Secondary | ICD-10-CM | POA: Diagnosis present

## 2013-02-12 DIAGNOSIS — Z9089 Acquired absence of other organs: Secondary | ICD-10-CM

## 2013-02-12 DIAGNOSIS — Z8673 Personal history of transient ischemic attack (TIA), and cerebral infarction without residual deficits: Secondary | ICD-10-CM

## 2013-02-12 DIAGNOSIS — N400 Enlarged prostate without lower urinary tract symptoms: Secondary | ICD-10-CM | POA: Diagnosis present

## 2013-02-12 DIAGNOSIS — E785 Hyperlipidemia, unspecified: Secondary | ICD-10-CM | POA: Diagnosis present

## 2013-02-12 DIAGNOSIS — F329 Major depressive disorder, single episode, unspecified: Secondary | ICD-10-CM | POA: Diagnosis present

## 2013-02-12 DIAGNOSIS — Z9181 History of falling: Secondary | ICD-10-CM

## 2013-02-12 DIAGNOSIS — I4891 Unspecified atrial fibrillation: Secondary | ICD-10-CM

## 2013-02-12 DIAGNOSIS — Z8249 Family history of ischemic heart disease and other diseases of the circulatory system: Secondary | ICD-10-CM

## 2013-02-13 ENCOUNTER — Encounter (HOSPITAL_COMMUNITY): Payer: Self-pay | Admitting: *Deleted

## 2013-02-13 DIAGNOSIS — S065XAA Traumatic subdural hemorrhage with loss of consciousness status unknown, initial encounter: Secondary | ICD-10-CM | POA: Diagnosis present

## 2013-02-13 DIAGNOSIS — S065X9A Traumatic subdural hemorrhage with loss of consciousness of unspecified duration, initial encounter: Secondary | ICD-10-CM | POA: Diagnosis present

## 2013-02-13 LAB — APTT: aPTT: 28 seconds (ref 24–37)

## 2013-02-13 LAB — CBC WITH DIFFERENTIAL/PLATELET
Basophils Absolute: 0 10*3/uL (ref 0.0–0.1)
Basophils Relative: 0 % (ref 0–1)
Eosinophils Absolute: 0.1 10*3/uL (ref 0.0–0.7)
Eosinophils Relative: 2 % (ref 0–5)
HCT: 31.6 % — ABNORMAL LOW (ref 39.0–52.0)
Hemoglobin: 10.6 g/dL — ABNORMAL LOW (ref 13.0–17.0)
Lymphocytes Relative: 47 % — ABNORMAL HIGH (ref 12–46)
Lymphs Abs: 3 10*3/uL (ref 0.7–4.0)
MCH: 30.6 pg (ref 26.0–34.0)
MCHC: 33.5 g/dL (ref 30.0–36.0)
MCV: 91.3 fL (ref 78.0–100.0)
Monocytes Absolute: 0.6 10*3/uL (ref 0.1–1.0)
Monocytes Relative: 10 % (ref 3–12)
Neutro Abs: 2.6 10*3/uL (ref 1.7–7.7)
Neutrophils Relative %: 41 % — ABNORMAL LOW (ref 43–77)
Platelets: 156 10*3/uL (ref 150–400)
RBC: 3.46 MIL/uL — ABNORMAL LOW (ref 4.22–5.81)
RDW: 13.5 % (ref 11.5–15.5)
WBC: 6.3 10*3/uL (ref 4.0–10.5)

## 2013-02-13 LAB — URINALYSIS, ROUTINE W REFLEX MICROSCOPIC
Bilirubin Urine: NEGATIVE
Glucose, UA: NEGATIVE mg/dL
Hgb urine dipstick: NEGATIVE
Ketones, ur: NEGATIVE mg/dL
Nitrite: NEGATIVE
Protein, ur: NEGATIVE mg/dL
Specific Gravity, Urine: 1.021 (ref 1.005–1.030)
Urobilinogen, UA: 1 mg/dL (ref 0.0–1.0)
pH: 6 (ref 5.0–8.0)

## 2013-02-13 LAB — URINE MICROSCOPIC-ADD ON

## 2013-02-13 LAB — COMPREHENSIVE METABOLIC PANEL
ALT: 20 U/L (ref 0–53)
AST: 23 U/L (ref 0–37)
Albumin: 3.2 g/dL — ABNORMAL LOW (ref 3.5–5.2)
Alkaline Phosphatase: 71 U/L (ref 39–117)
BUN: 29 mg/dL — ABNORMAL HIGH (ref 6–23)
CO2: 24 mEq/L (ref 19–32)
Calcium: 8.6 mg/dL (ref 8.4–10.5)
Chloride: 112 mEq/L (ref 96–112)
Creatinine, Ser: 1.76 mg/dL — ABNORMAL HIGH (ref 0.50–1.35)
GFR calc Af Amer: 39 mL/min — ABNORMAL LOW (ref >90)
GFR calc non Af Amer: 33 mL/min — ABNORMAL LOW (ref >90)
Glucose, Bld: 99 mg/dL (ref 70–99)
Potassium: 4.8 mEq/L (ref 3.7–5.3)
Sodium: 148 mEq/L — ABNORMAL HIGH (ref 137–147)
Total Bilirubin: 0.7 mg/dL (ref 0.3–1.2)
Total Protein: 6.3 g/dL (ref 6.0–8.3)

## 2013-02-13 LAB — ABO/RH: ABO/RH(D): A POS

## 2013-02-13 LAB — TYPE AND SCREEN
ABO/RH(D): A POS
Antibody Screen: NEGATIVE

## 2013-02-13 LAB — PROTIME-INR
INR: 1.19 (ref 0.00–1.49)
Prothrombin Time: 14.8 seconds (ref 11.6–15.2)

## 2013-02-13 LAB — MRSA PCR SCREENING: MRSA by PCR: NEGATIVE

## 2013-02-13 MED ORDER — MIDODRINE HCL 5 MG PO TABS
5.0000 mg | ORAL_TABLET | Freq: Two times a day (BID) | ORAL | Status: DC
  Administered 2013-02-14 – 2013-02-22 (×16): 5 mg via ORAL
  Filled 2013-02-13 (×22): qty 1

## 2013-02-13 MED ORDER — LATANOPROST 0.005 % OP SOLN
1.0000 [drp] | Freq: Every day | OPHTHALMIC | Status: DC
  Administered 2013-02-13 – 2013-02-21 (×9): 1 [drp] via OPHTHALMIC
  Filled 2013-02-13 (×3): qty 2.5

## 2013-02-13 MED ORDER — ACETAMINOPHEN 325 MG PO TABS
650.0000 mg | ORAL_TABLET | Freq: Four times a day (QID) | ORAL | Status: DC | PRN
  Administered 2013-02-16 – 2013-02-22 (×2): 650 mg via ORAL
  Filled 2013-02-13 (×2): qty 2

## 2013-02-13 MED ORDER — SODIUM CHLORIDE 0.9 % IJ SOLN
3.0000 mL | Freq: Two times a day (BID) | INTRAMUSCULAR | Status: DC
  Administered 2013-02-13 – 2013-02-22 (×14): 3 mL via INTRAVENOUS

## 2013-02-13 MED ORDER — BISACODYL 10 MG RE SUPP: 10.0000 mg | Freq: Every day | RECTAL | Status: DC | PRN

## 2013-02-13 MED ORDER — MAGNESIUM HYDROXIDE 400 MG/5ML PO SUSP: 30.0000 mL | Freq: Every day | ORAL | Status: DC | PRN

## 2013-02-13 MED ORDER — AMIODARONE HCL 200 MG PO TABS
200.0000 mg | ORAL_TABLET | Freq: Every day | ORAL | Status: DC
  Administered 2013-02-13 – 2013-02-22 (×9): 200 mg via ORAL
  Filled 2013-02-13 (×11): qty 1

## 2013-02-13 MED ORDER — DONEPEZIL HCL 10 MG PO TABS
10.0000 mg | ORAL_TABLET | Freq: Every day | ORAL | Status: DC
  Administered 2013-02-13 – 2013-02-21 (×9): 10 mg via ORAL
  Filled 2013-02-13 (×11): qty 1

## 2013-02-13 MED ORDER — ACETAMINOPHEN 650 MG RE SUPP: 650.0000 mg | Freq: Four times a day (QID) | RECTAL | Status: DC | PRN

## 2013-02-13 MED ORDER — OLANZAPINE 5 MG PO TABS
5.0000 mg | ORAL_TABLET | Freq: Every day | ORAL | Status: DC
  Administered 2013-02-13 – 2013-02-21 (×9): 5 mg via ORAL
  Filled 2013-02-13 (×11): qty 1

## 2013-02-13 MED ORDER — SODIUM CHLORIDE 0.9 % IJ SOLN
3.0000 mL | INTRAMUSCULAR | Status: DC | PRN
  Administered 2013-02-14 – 2013-02-20 (×3): 3 mL via INTRAVENOUS

## 2013-02-13 MED ORDER — LEVOTHYROXINE SODIUM 150 MCG PO TABS
150.0000 ug | ORAL_TABLET | Freq: Every day | ORAL | Status: DC
  Administered 2013-02-13 – 2013-02-22 (×9): 150 ug via ORAL
  Filled 2013-02-13 (×14): qty 1

## 2013-02-13 MED ORDER — ONDANSETRON HCL 4 MG PO TABS: 4.0000 mg | ORAL_TABLET | Freq: Four times a day (QID) | ORAL | Status: DC | PRN

## 2013-02-13 MED ORDER — ONDANSETRON HCL 4 MG/2ML IJ SOLN: 4.0000 mg | Freq: Four times a day (QID) | INTRAMUSCULAR | Status: DC | PRN

## 2013-02-13 MED ORDER — SODIUM CHLORIDE 0.9 % IV SOLN: 250.0000 mL | INTRAVENOUS | Status: DC | PRN

## 2013-02-13 NOTE — Progress Notes (Signed)
Subjective: Patient sitting up in chair, at bedside. The breakfast earlier today.  Objective: Vital signs in last 24 hours: Filed Vitals:   02/13/13 0000 02/13/13 0400 02/13/13 0747 02/13/13 0800  BP: 101/68 121/64  106/75  Pulse:    75  Temp: 98.1 F (36.7 C) 98.2 F (36.8 C) 98.1 F (36.7 C)   TempSrc: Axillary Oral Oral   Resp: 28 20  17   Height:  5\' 7"  (1.702 m)    Weight:  60.1 kg (132 lb 7.9 oz)    SpO2: 96% 95%  98%    Intake/Output from previous day: 02/09 0701 - 02/10 0700 In: -  Out: 150 [Urine:150] Intake/Output this shift: Total I/O In: 363 [P.O.:360; I.V.:3] Out: 100 [Urine:100]  Physical Exam:  Awake and alert, oriented x3 this morning. Moving all 4 extremities well.  CBC  Recent Labs  02/13/13 0556  WBC 6.3  HGB 10.6*  HCT 31.6*  PLT 156   BMET  Recent Labs  02/13/13 0556  NA 148*  K 4.8  CL 112  CO2 24  GLUCOSE 99  BUN 29*  CREATININE 1.76*  CALCIUM 8.6    Assessment/Plan: Neurologically stable. I've requested cardiology consultation regarding pacemaker function and transfer surgery later this week. BMET shows evidence of mild dehydration, have asked nursing staff to encourage by mouth fluids. We'll plan on rechecking BMET prior surgery.   Hosie Spangle, MD 02/13/2013, 11:13 AM
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
## 2013-02-13 NOTE — Progress Notes (Signed)
UR completed.  Brenden Rudman, RN BSN MHA CCM Trauma/Neuro ICU Case Manager [phone]  

## 2013-02-14 MED ORDER — SODIUM CHLORIDE 0.45 % IV SOLN
INTRAVENOUS | Status: DC
  Administered 2013-02-14 – 2013-02-15 (×3): via INTRAVENOUS

## 2013-02-14 NOTE — Progress Notes (Signed)
Subjective: Patient sitting up in chair, eating well.  Objective: Vital signs in last 24 hours: Filed Vitals:   02/14/13 0743 02/14/13 0800 02/14/13 0900 02/14/13 1100  BP:  106/55 147/86 98/63  Pulse:   76 75  Temp: 98.5 F (36.9 C)     TempSrc: Oral     Resp:  17 18 17   Height:      Weight:      SpO2:  96% 100% 99%    Intake/Output from previous day: 02/10 0701 - 02/11 0700 In: 1683 [P.O.:1680; I.V.:3] Out: 450 [Urine:450] Intake/Output this shift: Total I/O In: 3 [I.V.:3] Out: -   Physical Exam:  Awake and alert, oriented to name and [facility] all 4 extremities well. No drift of upper extremities.  CBC  Recent Labs  02/13/13 0556  WBC 6.3  HGB 10.6*  HCT 31.6*  PLT 156   BMET  Recent Labs  02/13/13 0556  NA 148*  K 4.8  CL 112  CO2 24  GLUCOSE 99  BUN 29*  CREATININE 1.76*  CALCIUM 8.6   Assessment/Plan: Patient neurologically stable. He will recheck BMET in a.m., and start IVF at 2300. We'll make n.p.o. as of midnight. For OR in a.m. Spoke with patient and his family, and answered their questions.   Hosie Spangle, MD 02/14/2013, 12:05 PM

## 2013-02-15 ENCOUNTER — Encounter (HOSPITAL_COMMUNITY): Payer: Medicare Other | Admitting: Certified Registered Nurse Anesthetist

## 2013-02-15 ENCOUNTER — Encounter (HOSPITAL_COMMUNITY): Payer: Self-pay | Admitting: Certified Registered Nurse Anesthetist

## 2013-02-15 ENCOUNTER — Inpatient Hospital Stay (HOSPITAL_COMMUNITY): Payer: Medicare Other | Admitting: Certified Registered Nurse Anesthetist

## 2013-02-15 ENCOUNTER — Inpatient Hospital Stay (HOSPITAL_COMMUNITY): Payer: Medicare Other

## 2013-02-15 ENCOUNTER — Encounter (HOSPITAL_COMMUNITY)
Disposition: A | Discharge status: Skilled Nursing Facility | Payer: Self-pay | Source: Other Acute Inpatient Hospital | Attending: Neurosurgery

## 2013-02-15 DIAGNOSIS — Z01818 Encounter for other preprocedural examination: Secondary | ICD-10-CM

## 2013-02-15 DIAGNOSIS — I4891 Unspecified atrial fibrillation: Secondary | ICD-10-CM

## 2013-02-15 DIAGNOSIS — Z950 Presence of cardiac pacemaker: Secondary | ICD-10-CM

## 2013-02-15 DIAGNOSIS — I951 Orthostatic hypotension: Secondary | ICD-10-CM

## 2013-02-15 DIAGNOSIS — Z01810 Encounter for preprocedural cardiovascular examination: Secondary | ICD-10-CM

## 2013-02-15 HISTORY — PX: CRANIOTOMY: SHX93

## 2013-02-15 LAB — BASIC METABOLIC PANEL
BUN: 28 mg/dL — ABNORMAL HIGH (ref 6–23)
CO2: 23 mEq/L (ref 19–32)
Calcium: 8.8 mg/dL (ref 8.4–10.5)
Chloride: 107 mEq/L (ref 96–112)
Creatinine, Ser: 1.46 mg/dL — ABNORMAL HIGH (ref 0.50–1.35)
GFR calc Af Amer: 48 mL/min — ABNORMAL LOW (ref >90)
GFR calc non Af Amer: 42 mL/min — ABNORMAL LOW (ref >90)
Glucose, Bld: 93 mg/dL (ref 70–99)
Potassium: 4.3 mEq/L (ref 3.7–5.3)
Sodium: 141 mEq/L (ref 137–147)

## 2013-02-15 SURGERY — CRANIOTOMY HEMATOMA EVACUATION SUBDURAL
Anesthesia: General | Site: Head

## 2013-02-15 MED ORDER — THROMBIN 5000 UNITS EX SOLR
CUTANEOUS | Status: DC | PRN
  Administered 2013-02-15: 5000 [IU] via TOPICAL

## 2013-02-15 MED ORDER — PROPOFOL 10 MG/ML IV BOLUS
INTRAVENOUS | Status: AC
  Filled 2013-02-15: qty 20

## 2013-02-15 MED ORDER — GLYCOPYRROLATE 0.2 MG/ML IJ SOLN
INTRAMUSCULAR | Status: DC | PRN
  Administered 2013-02-15: 1 mg via INTRAVENOUS

## 2013-02-15 MED ORDER — ARTIFICIAL TEARS OP OINT
TOPICAL_OINTMENT | OPHTHALMIC | Status: DC | PRN
  Administered 2013-02-15: 1 via OPHTHALMIC

## 2013-02-15 MED ORDER — SODIUM CHLORIDE 0.9 % IV SOLN
INTRAVENOUS | Status: DC | PRN
  Administered 2013-02-15: 14:00:00 via INTRAVENOUS

## 2013-02-15 MED ORDER — ONDANSETRON HCL 4 MG/2ML IJ SOLN
INTRAMUSCULAR | Status: AC
  Filled 2013-02-15: qty 2

## 2013-02-15 MED ORDER — LABETALOL HCL 5 MG/ML IV SOLN
INTRAVENOUS | Status: DC | PRN
  Administered 2013-02-15 (×4): 5 mg via INTRAVENOUS

## 2013-02-15 MED ORDER — 0.9 % SODIUM CHLORIDE (POUR BTL) OPTIME
TOPICAL | Status: DC | PRN
  Administered 2013-02-15 (×3): 1000 mL

## 2013-02-15 MED ORDER — SODIUM CHLORIDE 0.9 % IR SOLN
Status: DC | PRN
  Administered 2013-02-15: 15:00:00

## 2013-02-15 MED ORDER — THROMBIN 20000 UNITS EX SOLR
CUTANEOUS | Status: DC | PRN
  Administered 2013-02-15: 15:00:00 via TOPICAL

## 2013-02-15 MED ORDER — CEFTRIAXONE SODIUM 2 G IJ SOLR
2.0000 g | Freq: Once | INTRAMUSCULAR | Status: AC
  Administered 2013-02-15: 2 g via INTRAVENOUS
  Filled 2013-02-15: qty 2

## 2013-02-15 MED ORDER — FENTANYL CITRATE 0.05 MG/ML IJ SOLN
INTRAMUSCULAR | Status: DC | PRN
  Administered 2013-02-15 (×2): 50 ug via INTRAVENOUS
  Administered 2013-02-15: 100 ug via INTRAVENOUS

## 2013-02-15 MED ORDER — MIDAZOLAM HCL 2 MG/2ML IJ SOLN
INTRAMUSCULAR | Status: AC
  Filled 2013-02-15: qty 2

## 2013-02-15 MED ORDER — LIDOCAINE HCL (CARDIAC) 20 MG/ML IV SOLN
INTRAVENOUS | Status: AC
  Filled 2013-02-15: qty 5

## 2013-02-15 MED ORDER — SODIUM CHLORIDE 0.9 % IV SOLN
INTRAVENOUS | Status: DC | PRN
  Administered 2013-02-15 (×2): via INTRAVENOUS

## 2013-02-15 MED ORDER — SODIUM CHLORIDE 0.9 % IV SOLN
INTRAVENOUS | Status: DC
  Administered 2013-02-15 – 2013-02-16 (×2): via INTRAVENOUS
  Administered 2013-02-16: 1 mL via INTRAVENOUS
  Administered 2013-02-17 (×2): via INTRAVENOUS
  Administered 2013-02-18: 100 mL via INTRAVENOUS

## 2013-02-15 MED ORDER — PHENYLEPHRINE HCL 10 MG/ML IJ SOLN
INTRAMUSCULAR | Status: DC | PRN
  Administered 2013-02-15 (×5): 80 ug via INTRAVENOUS

## 2013-02-15 MED ORDER — ROCURONIUM BROMIDE 50 MG/5ML IV SOLN
INTRAVENOUS | Status: AC
  Filled 2013-02-15: qty 1

## 2013-02-15 MED ORDER — HYDRALAZINE HCL 20 MG/ML IJ SOLN: 5.0000 mg | INTRAMUSCULAR | Status: DC | PRN

## 2013-02-15 MED ORDER — ONDANSETRON HCL 4 MG/2ML IJ SOLN
INTRAMUSCULAR | Status: DC | PRN
  Administered 2013-02-15: 4 mg via INTRAVENOUS

## 2013-02-15 MED ORDER — FENTANYL CITRATE 0.05 MG/ML IJ SOLN
INTRAMUSCULAR | Status: AC
  Filled 2013-02-15: qty 5

## 2013-02-15 MED ORDER — LABETALOL HCL 5 MG/ML IV SOLN: 5.0000 mg | INTRAVENOUS | Status: DC | PRN

## 2013-02-15 MED ORDER — PHENYLEPHRINE HCL 10 MG/ML IJ SOLN
10.0000 mg | INTRAVENOUS | Status: DC | PRN
  Administered 2013-02-15: 30 ug/min via INTRAVENOUS

## 2013-02-15 MED ORDER — NEOSTIGMINE METHYLSULFATE 1 MG/ML IJ SOLN
INTRAMUSCULAR | Status: DC | PRN
  Administered 2013-02-15: 5 mg via INTRAVENOUS

## 2013-02-15 MED ORDER — GLYCOPYRROLATE 0.2 MG/ML IJ SOLN
INTRAMUSCULAR | Status: AC
  Filled 2013-02-15: qty 4

## 2013-02-15 MED ORDER — ROCURONIUM BROMIDE 100 MG/10ML IV SOLN
INTRAVENOUS | Status: DC | PRN
  Administered 2013-02-15: 50 mg via INTRAVENOUS

## 2013-02-15 MED ORDER — METHYLENE BLUE 1 % INJ SOLN
INTRAMUSCULAR | Status: DC | PRN
  Administered 2013-02-15: 1 mL

## 2013-02-15 MED ORDER — LIDOCAINE-EPINEPHRINE 1 %-1:100000 IJ SOLN
INTRAMUSCULAR | Status: DC | PRN
  Administered 2013-02-15: 25 mL

## 2013-02-15 MED ORDER — PROPOFOL 10 MG/ML IV BOLUS
INTRAVENOUS | Status: DC | PRN
  Administered 2013-02-15: 100 mg via INTRAVENOUS

## 2013-02-15 MED ORDER — PHENYLEPHRINE 40 MCG/ML (10ML) SYRINGE FOR IV PUSH (FOR BLOOD PRESSURE SUPPORT)
PREFILLED_SYRINGE | INTRAVENOUS | Status: AC
  Filled 2013-02-15: qty 10

## 2013-02-15 MED ORDER — MIDAZOLAM HCL 5 MG/5ML IJ SOLN
INTRAMUSCULAR | Status: DC | PRN
  Administered 2013-02-15: 2 mg via INTRAVENOUS

## 2013-02-15 MED ORDER — BUPIVACAINE HCL (PF) 0.25 % IJ SOLN
INTRAMUSCULAR | Status: DC | PRN
  Administered 2013-02-15: 25 mL

## 2013-02-15 MED ORDER — HEMOSTATIC AGENTS (NO CHARGE) OPTIME
TOPICAL | Status: DC | PRN
  Administered 2013-02-15: 1 via TOPICAL

## 2013-02-15 MED ORDER — LIDOCAINE HCL (CARDIAC) 20 MG/ML IV SOLN
INTRAVENOUS | Status: DC | PRN
  Administered 2013-02-15: 100 mg via INTRAVENOUS

## 2013-02-15 SURGICAL SUPPLY — 74 items
APPLICATOR COTTON TIP 6IN STRL (MISCELLANEOUS) ×2 IMPLANT
BAG DECANTER FOR FLEXI CONT (MISCELLANEOUS) ×2 IMPLANT
BANDAGE GAUZE 4  KLING STR (GAUZE/BANDAGES/DRESSINGS) IMPLANT
BANDAGE GAUZE ELAST BULKY 4 IN (GAUZE/BANDAGES/DRESSINGS) IMPLANT
BIT DRILL WIRE PASS 1.3MM (BIT) IMPLANT
BRUSH SCRUB EZ PLAIN DRY (MISCELLANEOUS) ×2 IMPLANT
BUR ACORN 6.0 PRECISION (BURR) ×2 IMPLANT
BUR ROUTER D-58 CRANI (BURR) ×2 IMPLANT
CANISTER SUCT 3000ML (MISCELLANEOUS) ×2 IMPLANT
CLIP TI MEDIUM 6 (CLIP) IMPLANT
CONT SPEC 4OZ CLIKSEAL STRL BL (MISCELLANEOUS) ×4 IMPLANT
CORDS BIPOLAR (ELECTRODE) ×2 IMPLANT
DRAIN JACKSON PRATT 10MM FLAT (MISCELLANEOUS) ×2 IMPLANT
DRAIN PENROSE 1/2X12 LTX STRL (WOUND CARE) IMPLANT
DRAIN SNY WOU 7FLT (WOUND CARE) IMPLANT
DRAPE NEUROLOGICAL W/INCISE (DRAPES) ×2 IMPLANT
DRAPE SURG 17X23 STRL (DRAPES) IMPLANT
DRAPE SURG IRRIG POUCH 19X23 (DRAPES) IMPLANT
DRAPE WARM FLUID 44X44 (DRAPE) ×2 IMPLANT
DRILL WIRE PASS 1.3MM (BIT)
DRSG ADAPTIC 3X8 NADH LF (GAUZE/BANDAGES/DRESSINGS) ×2 IMPLANT
ELECT CAUTERY BLADE 6.4 (BLADE) ×2 IMPLANT
ELECT REM PT RETURN 9FT ADLT (ELECTROSURGICAL) ×2
ELECTRODE REM PT RTRN 9FT ADLT (ELECTROSURGICAL) ×1 IMPLANT
EVACUATOR SILICONE 100CC (DRAIN) ×4 IMPLANT
GAUZE SPONGE 4X4 16PLY XRAY LF (GAUZE/BANDAGES/DRESSINGS) IMPLANT
GLOVE BIOGEL PI IND STRL 7.0 (GLOVE) ×1 IMPLANT
GLOVE BIOGEL PI IND STRL 8 (GLOVE) ×1 IMPLANT
GLOVE BIOGEL PI INDICATOR 7.0 (GLOVE) ×1
GLOVE BIOGEL PI INDICATOR 8 (GLOVE) ×1
GLOVE ECLIPSE 6.5 STRL STRAW (GLOVE) ×2 IMPLANT
GLOVE ECLIPSE 7.5 STRL STRAW (GLOVE) ×4 IMPLANT
GLOVE EXAM NITRILE LRG STRL (GLOVE) IMPLANT
GLOVE EXAM NITRILE MD LF STRL (GLOVE) ×4 IMPLANT
GLOVE EXAM NITRILE XL STR (GLOVE) IMPLANT
GLOVE EXAM NITRILE XS STR PU (GLOVE) IMPLANT
GLOVE SURG SS PI 7.0 STRL IVOR (GLOVE) ×6 IMPLANT
GOWN BRE IMP SLV AUR LG STRL (GOWN DISPOSABLE) IMPLANT
GOWN BRE IMP SLV AUR XL STRL (GOWN DISPOSABLE) IMPLANT
GOWN STRL REIN 2XL LVL4 (GOWN DISPOSABLE) IMPLANT
HEMOSTAT SURGICEL 2X14 (HEMOSTASIS) ×2 IMPLANT
HOOK DURA (MISCELLANEOUS) ×2 IMPLANT
KIT BASIN OR (CUSTOM PROCEDURE TRAY) ×2 IMPLANT
KIT ROOM TURNOVER OR (KITS) ×2 IMPLANT
NEEDLE SPNL 22GX3.5 QUINCKE BK (NEEDLE) ×4 IMPLANT
NS IRRIG 1000ML POUR BTL (IV SOLUTION) ×6 IMPLANT
PACK CRANIOTOMY (CUSTOM PROCEDURE TRAY) ×2 IMPLANT
PAD ABD 8X10 STRL (GAUZE/BANDAGES/DRESSINGS) IMPLANT
PAD ARMBOARD 7.5X6 YLW CONV (MISCELLANEOUS) ×4 IMPLANT
PATTIES SURGICAL .5 X.5 (GAUZE/BANDAGES/DRESSINGS) IMPLANT
PATTIES SURGICAL .5 X3 (DISPOSABLE) IMPLANT
PATTIES SURGICAL 1/4 X 3 (GAUZE/BANDAGES/DRESSINGS) ×2 IMPLANT
PATTIES SURGICAL 1X1 (DISPOSABLE) IMPLANT
PIN MAYFIELD SKULL DISP (PIN) ×2 IMPLANT
PLATE 1.5 5HOLE SQUARE (Plate) ×4 IMPLANT
SCREW SELF DRILL HT 1.5/4MM (Screw) ×16 IMPLANT
SPECIMEN JAR SMALL (MISCELLANEOUS) IMPLANT
SPONGE GAUZE 4X4 12PLY (GAUZE/BANDAGES/DRESSINGS) ×2 IMPLANT
SPONGE NEURO XRAY DETECT 1X3 (DISPOSABLE) IMPLANT
SPONGE SURGIFOAM ABS GEL 100 (HEMOSTASIS) ×2 IMPLANT
STAPLER SKIN PROX WIDE 3.9 (STAPLE) ×2 IMPLANT
SUT ETHILON 3 0 FSL (SUTURE) ×2 IMPLANT
SUT NURALON 4 0 TR CR/8 (SUTURE) ×4 IMPLANT
SUT VIC AB 2-0 CP2 18 (SUTURE) ×4 IMPLANT
SYR 20ML ECCENTRIC (SYRINGE) ×2 IMPLANT
SYR CONTROL 10ML LL (SYRINGE) ×4 IMPLANT
TAPE CLOTH SURG 4X10 WHT LF (GAUZE/BANDAGES/DRESSINGS) ×2 IMPLANT
TOWEL OR 17X24 6PK STRL BLUE (TOWEL DISPOSABLE) ×2 IMPLANT
TOWEL OR 17X26 10 PK STRL BLUE (TOWEL DISPOSABLE) ×2 IMPLANT
TRAP SPECIMEN MUCOUS 40CC (MISCELLANEOUS) IMPLANT
TRAY FOLEY CATH 16FRSI W/METER (SET/KITS/TRAYS/PACK) IMPLANT
TRAY FOLEY IC TEMP SENS 16FR (CATHETERS) ×2 IMPLANT
UNDERPAD 30X30 INCONTINENT (UNDERPADS AND DIAPERS) IMPLANT
WATER STERILE IRR 1000ML POUR (IV SOLUTION) ×2 IMPLANT

## 2013-02-15 NOTE — Progress Notes (Signed)
Subjective: Patient in ICU bed. Patient initially on arrival to ICU was minimally responsive, his pupils were about 5 mm and nonreactive to light. There was moderate bloody drainage into the Jackson-Pratt drain. We therefore sent the patient to CT scan for CT the brain without contrast. This shows moderate pneumocephalus in the subdural space with little change overall in the mass effect.  Objective: Vital signs in last 24 hours: Filed Vitals:   02/15/13 1630 02/15/13 1644 02/15/13 1718 02/15/13 1800  BP:  131/74 115/71 119/79  Pulse: 74 77 74 85  Temp:  97.4 F (36.3 C)    TempSrc:      Resp: 13 13 14 14   Height:      Weight:      SpO2: 100% 100% 100% 100%    Intake/Output from previous day: 02/11 0701 - 02/12 0700 In: 773 [P.O.:240; I.V.:533] Out: 625 [Urine:625] Intake/Output this shift: Total I/O In: 1500 [I.V.:1500] Out: 410 [Urine:310; Drains:75; Blood:25]  Physical Exam:  Awake, opening eyes, speaking. Oriented to his name. Not oriented to place or time. Following commands with all 4 extremities. Moving all 4 extremities well. Pupils 5 mm, round, reactive to light.  CBC  Recent Labs  02/13/13 0556  WBC 6.3  HGB 10.6*  HCT 31.6*  PLT 156   BMET  Recent Labs  02/13/13 0556 02/15/13 0335  NA 148* 141  K 4.8 4.3  CL 112 107  CO2 24 23  GLUCOSE 99 93  BUN 29* 28*  CREATININE 1.76* 1.46*  CALCIUM 8.6 8.8    Studies/Results: Ct Head Wo Contrast  02/15/2013   CLINICAL DATA:  Status post craniotomy, subdural hematomas  EXAM: CT HEAD WITHOUT CONTRAST  TECHNIQUE: Contiguous axial images were obtained from the base of the skull through the vertex without intravenous contrast.  COMPARISON:  02/12/2013  FINDINGS: The bony calvarium shows changes consistent with a recent craniotomy. A large surgical drain is noted. A significant amount of subdural air is seen. There is been partial evacuation of the a crude on chronic subdural hematoma on the left. The right-sided  subdural hematoma is stable in appearance. There remains midline shift of approximately 8 mm. This is roughly stable from the prior exam. No areas of acute infarction or acute hemorrhage are seen.  IMPRESSION: Bilateral subdural hematomas. A left craniotomy has been performed. There remains mass effect with midline shift from left to right. A large amount of subdural air is noted in the surgical bed.  The right subdural hematoma now has a hematocrit level indicating some settling of the blood products.   Electronically Signed   By: Inez Catalina M.D.   On: 02/15/2013 18:03    Assessment/Plan: Improving following general anesthesia and surgery. We'll continue to monitor neurologic function. We'll change IVF from half-normal saline to normal saline. Labs pending.   Hosie Spangle, MD 02/15/2013, 6:33 PM

## 2013-02-15 NOTE — Progress Notes (Signed)
Dr.Nudleman at bedside to assess large nonreactive pupils at this time--no new orders. Pt is still very sedated. Will cont to monitor.

## 2013-02-15 NOTE — Anesthesia Procedure Notes (Signed)
Procedure Name: Intubation Date/Time: 02/15/2013 2:20 PM Performed by: Raphael Gibney T Pre-anesthesia Checklist: Patient identified, Timeout performed, Emergency Drugs available, Suction available and Patient being monitored Patient Re-evaluated:Patient Re-evaluated prior to inductionOxygen Delivery Method: Circle system utilized and Simple face mask Preoxygenation: Pre-oxygenation with 100% oxygen Intubation Type: IV induction Ventilation: Mask ventilation without difficulty and Oral airway inserted - appropriate to patient size Laryngoscope Size: Mac and 4 Grade View: Grade I Tube type: Subglottic suction tube Tube size: 8.0 mm Number of attempts: 1 Airway Equipment and Method: Patient positioned with wedge pillow and Stylet Placement Confirmation: ETT inserted through vocal cords under direct vision,  positive ETCO2 and breath sounds checked- equal and bilateral Secured at: 21 cm Tube secured with: Tape Dental Injury: Teeth and Oropharynx as per pre-operative assessment

## 2013-02-15 NOTE — Op Note (Signed)
02/12/2013 - 02/15/2013  3:44 PM  PATIENT:  Reginald [name]  80 y.o. male  PRE-OPERATIVE DIAGNOSIS:  Bilateral hemispheric subacute and chronic Subdural Hematoma  POST-OPERATIVE DIAGNOSIS:  Bilateral hemispheric subacute and chronic Subdural Hematoma  PROCEDURE:  Procedure(s): CRANIOTOMY HEMATOMA EVACUATION SUBDURAL:  Left frontal temporoparietal craniotomy and evacuation of subdural hematoma  SURGEON:  Surgeon(s): Hosie Spangle, MD Winfield Cunas, MD  ASSISTANTS: Ashok Pall, MD  ANESTHESIA:   general  EBL:  Total I/O In: 3009 [I.V.:1450] Out: -   BLOOD ADMINISTERED:none  COUNT: Correct per nursing staff  DRAINS: (10 mm) Jackson-Pratt drain(s) with closed bulb suction in the Subdural space   SPECIMEN:  No Specimen  DICTATION: Patient brought the operating room, placed under general endotracheal anesthesia. The scalp was shaved with an electric shaver, and the patient was placed in a 3 pin Mayfield head holder, a roll was placed beneath the left shoulder, the head and neck were gently turned towards the right,. The scalp was prepped with Betadine soap and solution draped in a sterile fashion. A left parasagittal incision was made extending from the frontal boss to the parietal boss. The line of the incision was infiltrated with local site with epinephrine. Incision was made, pedicles were applied for scalp edge is to maintain hemostasis. The self-retaining retractors are placed a single burr hole was made with the high-speed drill, and the dura was dissected from the overlying skull, and then using a craniotome attachment were able to pull drop. The bone flap was elevated and set aside to be reimplanted. The dura was opened in a U-shaped fashion, and a thick parietal subdural membrane was immediately visualized. The subdural membrane was incised, and liquid subdural hematoma was evacuated. We then excised the portion of the parietal membrane within the craniotomy exposure. We then  used warm saline to irrigate the remaining chronic and subacute subdural hematoma from the subdural space. The subdural space was irrigated until clear. We then gently elevated the visceral subdural membrane, a small opening was created in that membrane, and then we used warm saline irrigation to gently elevate that this will membrane off of the brain surface. The visceral subdural membrane was incised in a cruciate fashion the edges of the membrane were coagulated with bipolar cautery. The subdural space was again irrigated until clear. We then placed a Jackson-Pratt drain in the subdural space, and was brought out through a separate stab incision. The drain was sutured to the scalp with a 3-0 nylon suture. The dural flap was approximated to the dural edges with 4-0 Nurolon sutures. The dura was tacked up around the margins the craniotomy with 4-0 Nurolon sutures. We then secured the bone flap to the margins the craniotomy with 2 square Lorenz cranial plates, and 4 mm self drilling screws. The scalp was closed in layers. The galea was closed with interrupted inverted 2-0 Vicryl sutures. The skin edges were closed with surgical staples. The wound was dressed with Adaptic, sterile gauze, and Hypafix. Following surgery the patient was taken out of the 3 pin Mayfield headholder, to be reversed from the anesthetic and extubated, if feasible, and transferred to recovery for further care, to be later transferred back to the intensive care unit.  PLAN OF CARE: Admit to inpatient   PATIENT DISPOSITION:  PACU - hemodynamically stable.   Delay start of Pharmacological VTE agent (>24hrs) due to surgical blood loss or risk of bleeding:  yes

## 2013-02-15 NOTE — Preoperative (Signed)
Beta Blockers   Reason not to administer Beta Blockers:Not Applicable 

## 2013-02-15 NOTE — Transfer of Care (Signed)
Immediate Anesthesia Transfer of Care Note  Patient: Reginald [name]  Procedure(s) Performed: Procedure(s): CRANIOTOMY HEMATOMA EVACUATION SUBDURAL (N/A)  Patient Location: PACU  Anesthesia Type:General  Level of Consciousness: awake  Airway & Oxygen Therapy: Patient Spontanous Breathing and Patient connected to face mask oxygen  Post-op Assessment: Report given to PACU RN, Post -op Vital signs reviewed and stable and Patient moving all extremities X 4  Post vital signs: Reviewed and stable  Complications: No apparent anesthesia complications

## 2013-02-15 NOTE — Progress Notes (Signed)
Pt had heart rhythm change. Dr. Sherwood Gambler notified. 12 lead EKG ordered. Will continue to monitor.
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
## 2013-02-15 NOTE — Anesthesia Preprocedure Evaluation (Signed)
Anesthesia Evaluation  Patient identified by MRN, date of birth, ID band Patient awake    Reviewed: Allergy & Precautions, H&P , NPO status   Airway Mallampati: I TM Distance: >3 FB Neck ROM: Full    Dental   Pulmonary          Cardiovascular hypertension, Pt. on medications + dysrhythmias Atrial Fibrillation + pacemaker     Neuro/Psych    GI/Hepatic   Endo/Other    Renal/GU      Musculoskeletal   Abdominal   Peds  Hematology   Anesthesia Other Findings   Reproductive/Obstetrics                           Anesthesia Physical Anesthesia Plan  ASA: III and emergent  Anesthesia Plan: General   Post-op Pain Management:    Induction: Intravenous  Airway Management Planned: Oral ETT  Additional Equipment: Arterial line  Intra-op Plan:   Post-operative Plan: Possible Post-op intubation/ventilation  Informed Consent: I have reviewed the patients History and Physical, chart, labs and discussed the procedure including the risks, benefits and alternatives for the proposed anesthesia with the patient or authorized representative who has indicated his/her understanding and acceptance.     Plan Discussed with: CRNA and Surgeon  Anesthesia Plan Comments:         Anesthesia Quick Evaluation

## 2013-02-15 NOTE — Progress Notes (Signed)
Subjective: Patient resting in bed comfortably. Had what appeared to be an arrhythmia earlier this morning. Has been seen by cardiology who explains that at times he is atrially paced with normal intrinsic ventricular conduction, and at other times he is AV paced. They're overall opinion is that he has normal facial function and no cardiac issues at this time, it is low risk for cardiac standpoint for general anesthesia and surgery.  Objective: Vital signs in last 24 hours: Filed Vitals:   02/15/13 0600 02/15/13 0759 02/15/13 0800 02/15/13 0900  BP: 111/86  125/94 118/94  Pulse:      Temp:  97.7 F (36.5 C)    TempSrc:  Oral    Resp: 20  21 28   Height:      Weight:      SpO2:   98%     Intake/Output from previous day: 02/11 0701 - 02/12 0700 In: 773 [P.O.:240; I.V.:533] Out: 625 [Urine:625] Intake/Output this shift: Total I/O In: 225 [I.V.:225] Out: -   Physical Exam:  Continues to have episodic confusion, but at other times is awake alert and oriented to his name and place. Moving all extremities well.  CBC  Recent Labs  02/13/13 0556  WBC 6.3  HGB 10.6*  HCT 31.6*  PLT 156   BMET  Recent Labs  02/13/13 0556 02/15/13 0335  NA 148* 141  K 4.8 4.3  CL 112 107  CO2 24 23  GLUCOSE 99 93  BUN 29* 28*  CREATININE 1.76* 1.46*  CALCIUM 8.6 8.8    Assessment/Plan: For left hemisphere craniotomy for evacuation of subdural hematoma today.   Hosie Spangle, MD 02/15/2013, 10:15 AM

## 2013-02-16 ENCOUNTER — Encounter (HOSPITAL_COMMUNITY): Payer: Self-pay | Admitting: Neurosurgery

## 2013-02-16 ENCOUNTER — Encounter: Payer: Medicare Other | Admitting: *Deleted

## 2013-02-16 DIAGNOSIS — Z01818 Encounter for other preprocedural examination: Secondary | ICD-10-CM

## 2013-02-16 LAB — CBC WITH DIFFERENTIAL/PLATELET
Basophils Absolute: 0 10*3/uL (ref 0.0–0.1)
Basophils Relative: 0 % (ref 0–1)
Eosinophils Absolute: 0 10*3/uL (ref 0.0–0.7)
Eosinophils Relative: 0 % (ref 0–5)
HCT: 30.9 % — ABNORMAL LOW (ref 39.0–52.0)
Hemoglobin: 10.7 g/dL — ABNORMAL LOW (ref 13.0–17.0)
Lymphocytes Relative: 21 % (ref 12–46)
Lymphs Abs: 1.6 10*3/uL (ref 0.7–4.0)
MCH: 31.2 pg (ref 26.0–34.0)
MCHC: 34.6 g/dL (ref 30.0–36.0)
MCV: 90.1 fL (ref 78.0–100.0)
Monocytes Absolute: 0.6 10*3/uL (ref 0.1–1.0)
Monocytes Relative: 8 % (ref 3–12)
Neutro Abs: 5.3 10*3/uL (ref 1.7–7.7)
Neutrophils Relative %: 71 % (ref 43–77)
Platelets: 139 10*3/uL — ABNORMAL LOW (ref 150–400)
RBC: 3.43 MIL/uL — ABNORMAL LOW (ref 4.22–5.81)
RDW: 13.1 % (ref 11.5–15.5)
WBC: 7.6 10*3/uL (ref 4.0–10.5)

## 2013-02-16 LAB — BASIC METABOLIC PANEL
BUN: 19 mg/dL (ref 6–23)
CO2: 19 mEq/L (ref 19–32)
Calcium: 8 mg/dL — ABNORMAL LOW (ref 8.4–10.5)
Chloride: 108 mEq/L (ref 96–112)
Creatinine, Ser: 1.2 mg/dL (ref 0.50–1.35)
GFR calc Af Amer: 61 mL/min — ABNORMAL LOW (ref >90)
GFR calc non Af Amer: 53 mL/min — ABNORMAL LOW (ref >90)
Glucose, Bld: 98 mg/dL (ref 70–99)
Potassium: 4.5 mEq/L (ref 3.7–5.3)
Sodium: 140 mEq/L (ref 137–147)

## 2013-02-16 MED ORDER — HALOPERIDOL LACTATE 5 MG/ML IJ SOLN: 1.0000 mg | Freq: Four times a day (QID) | INTRAMUSCULAR | Status: DC | PRN

## 2013-02-16 MED ORDER — LORAZEPAM 2 MG/ML IJ SOLN
0.5000 mg | INTRAMUSCULAR | Status: DC | PRN
  Administered 2013-02-16: 1 mg via INTRAVENOUS
  Administered 2013-02-18 (×2): 0.5 mg via INTRAVENOUS
  Filled 2013-02-16: qty 1

## 2013-02-16 MED ORDER — HYDROCODONE-ACETAMINOPHEN 5-325 MG PO TABS
1.0000 | ORAL_TABLET | ORAL | Status: DC | PRN
  Administered 2013-02-17 – 2013-02-20 (×6): 1 via ORAL
  Filled 2013-02-16 (×6): qty 1

## 2013-02-16 MED ORDER — MEPERIDINE HCL 25 MG/ML IJ SOLN: 6.2500 mg | INTRAMUSCULAR | Status: DC | PRN

## 2013-02-16 MED ORDER — ONDANSETRON HCL 4 MG/2ML IJ SOLN: 4.0000 mg | Freq: Once | INTRAMUSCULAR | Status: DC | PRN

## 2013-02-16 MED ORDER — HYDROMORPHONE HCL PF 1 MG/ML IJ SOLN: 0.2500 mg | INTRAMUSCULAR | Status: DC | PRN

## 2013-02-16 MED ORDER — LORAZEPAM 2 MG/ML IJ SOLN
INTRAMUSCULAR | Status: AC
  Administered 2013-02-16: 1 mg via INTRAVENOUS
  Filled 2013-02-16: qty 1

## 2013-02-16 MED ORDER — OXYCODONE HCL 5 MG PO TABS: 5.0000 mg | ORAL_TABLET | Freq: Once | ORAL | Status: DC | PRN

## 2013-02-16 MED ORDER — OXYCODONE HCL 5 MG/5ML PO SOLN: 5.0000 mg | Freq: Once | ORAL | Status: DC | PRN

## 2013-02-16 NOTE — Anesthesia Postprocedure Evaluation (Signed)
  Anesthesia Post-op Note  Patient: Reginald [name]  Procedure(s) Performed: Procedure(s): CRANIOTOMY HEMATOMA EVACUATION SUBDURAL (N/A)  Patient Location: ICU  Anesthesia Type:General  Level of Consciousness: awake, alert , oriented and patient cooperative  Airway and Oxygen Therapy: Patient Spontanous Breathing and Patient connected to nasal cannula oxygen  Post-op Pain: none  Post-op Assessment: Post-op Vital signs reviewed, Patient's Cardiovascular Status Stable, Respiratory Function Stable, Patent Airway and No signs of Nausea or vomiting  Post-op Vital Signs: Reviewed  Complications: No apparent anesthesia complications

## 2013-02-16 NOTE — Progress Notes (Signed)
Subjective: Patient with some confusion and restlessness last night per nursing staff. Limited drainage into Jackson-Pratt drain.  Objective: Vital signs in last 24 hours: Filed Vitals:   02/16/13 0300 02/16/13 0312 02/16/13 0400 02/16/13 0500  BP: 135/101  125/71 117/88  Pulse: 80  79 82  Temp:  99.7 F (37.6 C)    TempSrc:  Oral    Resp: 33     Height:      Weight:      SpO2: 100%  98% 96%    Intake/Output from previous day: 02/12 0701 - 02/13 0700 In: 2741.7 [I.V.:2741.7] Out: 1450 [Urine:1260; Drains:165; Blood:25] Intake/Output this shift:    Physical Exam:  Sleeping, but awakens and is oriented to his name. Moving all 4 extremities well.  CBC  Recent Labs  02/16/13 0517  WBC 7.6  HGB 10.7*  HCT 30.9*  PLT 139*   BMET  Recent Labs  02/15/13 0335 02/16/13 0517  NA 141 140  K 4.3 4.5  CL 107 108  CO2 23 19  GLUCOSE 93 98  BUN 28* 19  CREATININE 1.46* 1.20  CALCIUM 8.8 8.0*    Studies/Results: Ct Head Wo Contrast  02/15/2013   CLINICAL DATA:  Status post craniotomy, subdural hematomas  EXAM: CT HEAD WITHOUT CONTRAST  TECHNIQUE: Contiguous axial images were obtained from the base of the skull through the vertex without intravenous contrast.  COMPARISON:  02/12/2013  FINDINGS: The bony calvarium shows changes consistent with a recent craniotomy. A large surgical drain is noted. A significant amount of subdural air is seen. There is been partial evacuation of the a crude on chronic subdural hematoma on the left. The right-sided subdural hematoma is stable in appearance. There remains midline shift of approximately 8 mm. This is roughly stable from the prior exam. No areas of acute infarction or acute hemorrhage are seen.  IMPRESSION: Bilateral subdural hematomas. A left craniotomy has been performed. There remains mass effect with midline shift from left to right. A large amount of subdural air is noted in the surgical bed.  The right subdural hematoma now has a  hematocrit level indicating some settling of the blood products.   Electronically Signed   By: Inez Catalina M.D.   On: 02/15/2013 18:03    Assessment/Plan: Labs continued to improve. Wound clean and dry, dressing changed by nursing staff. Will resume meds as before, and when alert resume diet. Will leave JP drain in, and plan CT scan early next week, or sooner if needed.   Hosie Spangle, MD 02/16/2013, 7:24 AM

## 2013-02-16 NOTE — Progress Notes (Signed)
UR completed.  Harlee Eckroth, RN BSN MHA CCM Trauma/Neuro ICU Case Manager [phone]  

## 2013-02-17 MED ORDER — WHITE PETROLATUM GEL
Status: AC
  Administered 2013-02-17: 0.2
  Filled 2013-02-17: qty 5

## 2013-02-17 MED ORDER — PHENOL 1.4 % MT LIQD
1.0000 | OROMUCOSAL | Status: DC | PRN
  Administered 2013-02-17 – 2013-02-18 (×2): 1 via OROMUCOSAL
  Filled 2013-02-17: qty 177

## 2013-02-17 MED ORDER — NYSTATIN 100000 UNIT/ML MT SUSP
5.0000 mL | Freq: Four times a day (QID) | OROMUCOSAL | Status: DC
  Administered 2013-02-17 – 2013-02-22 (×18): 500000 [IU] via ORAL
  Filled 2013-02-17 (×23): qty 5

## 2013-02-17 MED ORDER — MAGIC MOUTHWASH
5.0000 mL | Freq: Three times a day (TID) | ORAL | Status: DC | PRN
  Administered 2013-02-17 – 2013-02-18 (×4): 5 mL via ORAL
  Filled 2013-02-17 (×4): qty 5

## 2013-02-17 NOTE — Plan of Care (Signed)
Problem: Consults Goal: Diagnosis - Craniotomy Outcome: Completed/Met Date Met:  02/17/13 Subdural hematoma

## 2013-02-17 NOTE — Progress Notes (Signed)
Patient ID: Reginald [name], male   DOB: 1924-06-24, 80 y.o.   MRN: [id number] Afeb, vss Awake, alert Converses a bit. Follows commands. Drain still putting out fluid. Will recheck CT head tomorrow am.

## 2013-02-18 ENCOUNTER — Inpatient Hospital Stay (HOSPITAL_COMMUNITY): Payer: Medicare Other

## 2013-02-18 NOTE — Progress Notes (Signed)
Patient ID: Reginald [name], male   DOB: 08-01-1924, 80 y.o.   MRN: [id number] Afeb, vss More awake, alert, and conversant. CT today markedly improved with less shift. Will leave drain in as it is still functioning nicely. Will advance diet.

## 2013-02-18 NOTE — Progress Notes (Signed)
Patient extremely agitated, trying to get out of bed, pulling off tubes and wires. Confused, talking to himself and trying to "get home. Distraction and Ativan 0. 5mg  ineffective. We will inform MD.

## 2013-02-18 NOTE — Progress Notes (Signed)
Patient very agitated, pulling at tele cord. J.P. drain and his turban. Ativan 0.5 mg administered. We will continue to monitor.

## 2013-02-19 NOTE — Progress Notes (Signed)
Subjective: Patient sitting in bed, reading newspaper. Denies complaints. Jackson-Pratt drain continueing to put out serosanguineous fluid. CT scan early yesterday morning showed significant resolution of pneumocephalus, mild expansion of right hemispheric chronic subdural hematoma, decreased midline shift. Nursing staff reports confusion overnight.  Objective: Vital signs in last 24 hours: Filed Vitals:   02/19/13 0200 02/19/13 0300 02/19/13 0400 02/19/13 0500  BP: 129/76 114/72 103/64   Pulse: 80   82  Temp:      TempSrc:      Resp: 14 16 14 29   Height:      Weight:      SpO2: 96% 97% 97% 97%    Intake/Output from previous day: 02/15 0701 - 02/16 0700 In: 1146.5 [P.O.:720; I.V.:426.5] Out: 1475 [Urine:1050; Drains:425] Intake/Output this shift:    Physical Exam:  Awake alert, oriented to name and hospital. Following commands. Moving all 4 extremities well.   Studies/Results: Ct Head Wo Contrast  02/18/2013   CLINICAL DATA:  80 year old male status post surgery for subdural hematoma. Initial encounter.  EXAM: CT HEAD WITHOUT CONTRAST  TECHNIQUE: Contiguous axial images were obtained from the base of the skull through the vertex without intravenous contrast.  COMPARISON:  02/15/2013 and earlier.  FINDINGS: Trace left mastoid effusion is stable. Other Visualized paranasal sinuses and mastoids are clear.  Stable bone status post left frontal craniotomy. Stable postoperative changes to the overlying scalp soft tissues. Visualized orbit soft tissues are within normal limits.  Left subdural drain remains in place. Significantly decreased pneumocephalus.  Mixed density left side subdural hematoma has decreased, now measuring up to 8 mm in thickness (previously 12 mm at a comparable level). At the same time rightward midline shift has resolved. However, mixed density right subdural hematoma persists and measures up to 14 mm in thickness (unchanged).  No ventriculomegaly. Stable gray-white  matter differentiation throughout the brain. No evidence of cortically based acute infarction identified. No areas of new intracranial hemorrhage identified. Intracranial artery dolichoectasia and calcification.  IMPRESSION: 1. Decreased mixed density left subdural hematoma. Left subdural drain remains in place. 2. 14 mm mixed density right subdural hematoma unchanged. 3. Resolved midline shift.  Decreased pneumocephalus. 4. No new intracranial abnormality.   Electronically Signed   By: Lars Pinks M.D.   On: 02/18/2013 02:51    Assessment/Plan: Patient stable following evacuation of left hemispheric subdural hematoma last week. Have asked nursing staff to get patient out of bed for breakfast, and ambulate with assistance.   Hosie Spangle, MD 02/19/2013, 7:35 AM

## 2013-02-19 NOTE — Evaluation (Signed)
Physical Therapy Evaluation Patient Details Name: Reginald [name] MRN: [id number] DOB: 02-11-1924 Today's Date: 02/19/2013 Time: 2542-7062 PT Time Calculation (min): 36 min  PT Assessment / Plan / Recommendation History of Present Illness  Pt s/p CRANIOTOMY HEMATOMA EVACUATION SUBDURAL:  Left frontal temporoparietal craniotomy and evacuation of subdural hematoma  Clinical Impression  Pt currently requiring assist for all transfers and maximal assist for safe ambulation with RW. Pt unsafe to d/c home due to significant balance impairment, generalized deconditioning, and cognitive impairments. Pt to strongly benefit from ST-SNF placement to achieve safe mod I function for safe transition home with daughter who works during the day. Daughter in agreement and would like pt to go to [facility] hospital rehab.    PT Assessment  Patient needs continued PT services    Follow Up Recommendations  SNF;Supervision/Assistance - 24 hour    Does the patient have the potential to tolerate intense rehabilitation      Barriers to Discharge Decreased caregiver support daughter works during the day    Equipment Recommendations   (TBD)    Recommendations for Other Services OT consult   Frequency Min 4X/week    Precautions / Restrictions Precautions Precautions: Fall Restrictions Weight Bearing Restrictions: No   Pertinent Vitals/Pain Pt didn't report any pain      Mobility  Bed Mobility Overal bed mobility: Needs Assistance Bed Mobility: Supine to Sit Supine to sit: Min assist General bed mobility comments: increased time, use of bed rail, max directional v/c's Transfers Overall transfer level: Needs assistance Equipment used: Rolling walker (2 wheeled) Transfers: Sit to/from Stand Sit to Stand: Mod assist General transfer comment: max directional v/c's, pt with urinary incontence upon intial stand Ambulation/Gait Ambulation/Gait assistance: Mod assist;+2 physical assistance Ambulation  Distance (Feet): 60 Feet Assistive device: Rolling walker (2 wheeled) Gait Pattern/deviations: Step-to pattern;Decreased stride length;Shuffle;Antalgic;Narrow base of support;Drifts right/left;Decreased weight shift to right Gait velocity: slow General Gait Details: pt with strong L Lateral lean, progressively worse to requiring maxA at end of session with onset of fatigue. modA for safe walker management.    Exercises     PT Diagnosis: Difficulty walking;Generalized weakness  PT Problem List: Decreased strength;Decreased activity tolerance;Decreased balance;Decreased mobility;Decreased coordination;Decreased cognition;Decreased safety awareness PT Treatment Interventions: DME instruction;Gait training;Functional mobility training;Therapeutic activities;Therapeutic exercise;Balance training;Neuromuscular re-education;Cognitive remediation     PT Goals(Current goals can be found in the care plan section) Acute Rehab PT Goals PT Goal Formulation: With patient/family Time For Goal Achievement: 03/05/13 Potential to Achieve Goals: Good  Visit Information  Last PT Received On: 02/19/13 Assistance Needed: +2 (for safety) History of Present Illness: Pt s/p CRANIOTOMY HEMATOMA EVACUATION SUBDURAL:  Left frontal temporoparietal craniotomy and evacuation of subdural hematoma       Prior Functioning  Home Living Family/patient expects to be discharged to:: Private residence Living Arrangements: Children Available Help at Discharge: Family;Available PRN/intermittently (daughter works during the day but plans on hiring help) Type of Home: House Home Equipment: Gilford Rile - 4 wheels;Shower seat Additional Comments: daughter planed on SNF prior to d/c home Prior Function Level of Independence: Needs assistance Gait / Transfers Assistance Needed: held onto furniture in house, used [employer]  walker on a "bad day" ADL's / Homemaking Assistance Needed: pt was showering/bathing mod I with shower  seat Comments: PLOF from daughter, pt poor historian Communication Communication: HOH Dominant Hand: Right    Cognition  Cognition Arousal/Alertness: Awake/alert (pt sleeping upon PT arrival, easily awoken) Behavior During Therapy: WFL for tasks assessed/performed Overall Cognitive Status: Impaired/Different from baseline  Area of Impairment: Orientation;Memory;Awareness;Problem solving Orientation Level: Disoriented to;Place;Time;Situation Memory: Decreased short-term memory;Decreased recall of precautions Awareness: Intellectual Problem Solving: Slow processing;Decreased initiation;Difficulty sequencing;Requires verbal cues;Requires tactile cues General Comments: pt able to recall president and month at end of session but unable to recall place or situation    Extremity/Trunk Assessment Upper Extremity Assessment Upper Extremity Assessment: Generalized weakness Lower Extremity Assessment Lower Extremity Assessment: Generalized weakness Cervical / Trunk Assessment Cervical / Trunk Assessment: Kyphotic   Balance Balance Overall balance assessment: History of Falls;Needs assistance Sitting-balance support: Feet supported;No upper extremity supported Sitting balance-Leahy Scale: Poor Postural control: Posterior lean Standing balance support: Bilateral upper extremity supported Standing balance-Leahy Scale: Poor Standing balance comment: requires use of RW  End of Session PT - End of Session Equipment Utilized During Treatment: Gait belt Activity Tolerance: Patient tolerated treatment well Patient left: in chair;with call bell/phone within reach;with chair alarm set;with nursing/sitter in room Nurse Communication: Mobility status  GP     [name] 02/19/2013, 2:47 PM   [name], PT, DPT Pager #: [phone] Office #: [phone]
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
## 2013-02-20 NOTE — Progress Notes (Signed)
Subjective: Patient sitting up in chair. Jackson-Pratt drain continueing to put out minimally sanguinous tinged CSF.  Objective: Vital signs in last 24 hours: Filed Vitals:   02/20/13 0500 02/20/13 0600 02/20/13 0739 02/20/13 0800  BP: 94/58 93/51  100/71  Pulse: 74   80  Temp:   98.2 F (36.8 C)   TempSrc:   Oral   Resp: 8 20  17   Height:      Weight:      SpO2: 94% 93%  96%    Intake/Output from previous day: 02/16 0701 - 02/17 0700 In: 120 [P.O.:120] Out: 675 [Urine:350; Drains:325] Intake/Output this shift: Total I/O In: 243 [P.O.:240; I.V.:3] Out: 101 [Urine:120; Drains:155]  Physical Exam:  Awake and alert, oriented to name, [place], following commands with all 4 extremities. Pupils 1.5 mm, round, equal.  Wound clean and dry, healing well.  Assessment/Plan: Stable neurologically. We'll DC Jackson-Pratt drain and recheck CT brain without contrast in AM.  PT recommended SNF, nursing staff reports that patient's daughter also wants a referral to [facility] for SNF care.   Hosie Spangle, MD 02/20/2013, 10:45 AM

## 2013-02-20 NOTE — Progress Notes (Signed)
Physical Therapy Treatment Patient Details Name: Reginald [name] MRN: [id number] DOB: 1925/01/02 Today's Date: 02/20/2013 Time: 4818-5631 PT Time Calculation (min): 30 min  PT Assessment / Plan / Recommendation  History of Present Illness Pt s/p CRANIOTOMY HEMATOMA EVACUATION SUBDURAL:  Left frontal temporoparietal craniotomy and evacuation of subdural hematoma   PT Comments   Pt con't to demo some confusion and cognitive deficits but overall improved from yesterday. Pt with improved gait pattern but decreased ambulation tolerance this date. Pt con't to have increased falls risk and to benefit from ST-SNF to achieve safe level of function for pt to return home with daughter.   Follow Up Recommendations  SNF;Supervision/Assistance - 24 hour     Does the patient have the potential to tolerate intense rehabilitation     Barriers to Discharge        Equipment Recommendations       Recommendations for Other Services    Frequency Min 4X/week   Progress towards PT Goals Progress towards PT goals: Progressing toward goals  Plan Current plan remains appropriate    Precautions / Restrictions Precautions Precautions: Fall Restrictions Weight Bearing Restrictions: No   Pertinent Vitals/Pain Denies pain    Mobility  Transfers Overall transfer level: Needs assistance Equipment used: Rolling walker (2 wheeled) Transfers: Sit to/from Stand Sit to Stand: Min assist General transfer comment: with maximal directional verbal and tactile cues Ambulation/Gait Ambulation/Gait assistance: Mod assist Ambulation Distance (Feet): 40 Feet (x2 with seated rest break) Assistive device: Rolling walker (2 wheeled) Gait Pattern/deviations: Step-through pattern;Decreased stride length;Shuffle Gait velocity: slow General Gait Details: pt with improved upright positioning and ability to stay in walker however unable to tolerate increased distance due to onset of fatigue    Exercises General Exercises -  Lower Extremity Ankle Circles/Pumps: AROM;Both;10 reps;Seated Long Arc Quad: AROM;Both;20 reps;Seated Hip Flexion/Marching: AROM;Both;10 reps;Standing Mini-Sqauts: 10 reps   PT Diagnosis:    PT Problem List:   PT Treatment Interventions:     PT Goals (current goals can now be found in the care plan section)    Visit Information  Last PT Received On: 02/20/13 Assistance Needed: +2 History of Present Illness: Pt s/p CRANIOTOMY HEMATOMA EVACUATION SUBDURAL:  Left frontal temporoparietal craniotomy and evacuation of subdural hematoma    Subjective Data      Cognition  Cognition Arousal/Alertness: Awake/alert Behavior During Therapy: WFL for tasks assessed/performed Overall Cognitive Status: Impaired/Different from baseline Area of Impairment: Orientation;Memory;Awareness;Problem solving Orientation Level: Disoriented to;Time;Situation Memory: Decreased short-term memory;Decreased recall of precautions Awareness: Intellectual Problem Solving: Slow processing;Decreased initiation;Difficulty sequencing;Requires verbal cues;Requires tactile cues General Comments: pt with insight when LE are weak and request to sit down    Balance  Balance Standing balance support: Bilateral upper extremity supported Standing balance-Leahy Scale: Poor  End of Session PT - End of Session Equipment Utilized During Treatment: Gait belt Activity Tolerance: Patient tolerated treatment well Patient left: in chair;with call bell/phone within reach;with chair alarm set;with nursing/sitter in room Nurse Communication: Mobility status   GP     [name] 02/20/2013, 3:34 PM  [name], PT, DPT Pager #: [phone] Office #: [phone]

## 2013-02-20 NOTE — Clinical Documentation Improvement (Signed)
THIS DOCUMENT IS NOT A PERMANENT PART OF THE MEDICAL RECORD  Please update your documentation with the medical record to reflect your response to this query. If you need help knowing how to do this please call [phone].  02/20/13   Dear Dr.Nudelman / Associates,  In a better effort to capture your patient's severity of illness, reflect appropriate length of stay and utilization of resources, a review of the patient medical record has revealed the following indicators.    Based on your clinical judgment, please clarify and document in a progress note and/or discharge summary the clinical condition associated with the following supporting information:  In responding to this query please exercise your independent judgment.  The fact that a query is asked, does not imply that any particular answer is desired or expected.   Possible Clinical Conditions?  "     Severe Malnutrition   "     Protein Calorie Malnutrition "     Severe Protein Calorie Malnutrition "     Other Condition "     Cannot clinically determine      Nutrition Consult (02/20/13): Patient meets criteria for Severe Malnutrition as evidenced by severe fat and muscle wasting, and chronic illness with 8% weight loss x at least 9 months.  Signs & Symptoms: Ht: 5'7"      Wt: 132 lb 7.9 oz  BMI: 20.75   Treatment:  Magic cup TID with meals.   You may use possible, probable, or suspect with inpatient documentation. possible, probable, suspected diagnoses MUST be documented at the time of discharge  Reviewed: No additional documentation provided.                                 Thank You,  Theron Arista, Clinical Documentation Specialist: [place]

## 2013-02-21 ENCOUNTER — Encounter (HOSPITAL_COMMUNITY): Payer: Self-pay | Admitting: Radiology

## 2013-02-21 ENCOUNTER — Encounter: Payer: Self-pay | Admitting: *Deleted

## 2013-02-21 ENCOUNTER — Inpatient Hospital Stay (HOSPITAL_COMMUNITY): Payer: Medicare Other

## 2013-02-21 NOTE — Clinical Social Work Placement (Addendum)
Clinical Social Work Department CLINICAL SOCIAL WORK PLACEMENT NOTE 02/20/2013  Patient:  Reginald [name], Reginald [name]  Account Number:  [id number] Admit date:  02/12/2013  Clinical Social Worker:  Barbette Or, LCSW  Date/time:  02/20/2013 07:00 PM  Clinical Social Work is seeking post-discharge placement for this patient at the following level of care:   SKILLED NURSING   (*CSW will update this form in Epic as items are completed)   02/20/2013  Patient/family provided with [place] Department of Clinical Social Work's list of facilities offering this level of care within the geographic area requested by the patient (or if unable, by the patient's family).  02/20/2013  Patient/family informed of their freedom to choose among providers that offer the needed level of care, that participate in Medicare, Medicaid or managed care program needed by the patient, have an available bed and are willing to accept the patient.  02/20/2013  Patient/family informed of [facility]' ownership interest in [facility], as well as of the fact that they are under no obligation to receive care at this facility.  PASARR submitted to [facility] on  PASARR number received from [facility] on   FL2 transmitted to all facilities in geographic area requested by pt/family on  02/20/2013 FL2 transmitted to all facilities within larger geographic area on   Patient informed that his/her managed care company has contracts with or will negotiate with  certain facilities, including the following:     Patient/family informed of bed offers received:  02/21/2013 Patient chooses bed at [facility] SN[facility] Physician recommends and patient chooses bed at    Patient to be transferred to [facility] on   02/22/2013 Patient to be transferred to facility by [facility] -  [facility]  The following physician request were entered in Epic:   Additional Comments:

## 2013-02-21 NOTE — Progress Notes (Signed)
Received call from radiologist regarding CT results. Notified Dr. Sherwood Gambler of CT results. No new orders received. Will continue to monitor. Sharren Bridge, RN

## 2013-02-21 NOTE — Clinical Social Work Note (Signed)
Clinical Social Work Department BRIEF PSYCHOSOCIAL ASSESSMENT 02/20/2013  Patient:  Reginald [name], Reginald [name]     Account Number:  [id number]     Admit date:  02/12/2013  Clinical Social Worker:  Myles Lipps  Date/Time:  02/20/2013 07:00 PM  Referred by:  Physician  Date Referred:  02/20/2013 Referred for  SNF Placement   Other Referral:   Interview type:  Family Other interview type:   Spoke to patient daughter over the phone    PSYCHOSOCIAL DATA Living Status:  FAMILY Admitted from facility:   Level of care:   Primary support name:  Reginald [name]  [phone] Primary support relationship to patient:  CHILD, ADULT Degree of support available:   Strong - HCPOA    CURRENT CONCERNS Current Concerns  Post-Acute Placement   Other Concerns:    SOCIAL WORK ASSESSMENT / PLAN Clinical Social Worker spoke with patient daughter over the phone to offer support and discuss patient needs at discharge.  Patient daughter states that patient wife kicked patient out of the home in October and patient moved in with his daughter.  Patient and patient daughter have completed HCPOA paperwork and it is on patient shadow chart.  Patient and patient daughter agreeable to placement at [facility] and facility has extended the bed offer for 02/19.  CSW spoke with patient daughter over the phone who is agreeable with discharge plan.  CSW remains available for support and to facilitate patient discharge needs once medically stable.   Assessment/plan status:  Psychosocial Support/Ongoing Assessment of Needs Other assessment/ plan:   Information/referral to community resources:   [facility] representative offered patient family facility list, however due to previous placement at [place] patient family had already made decisions.    PATIENT'S/FAMILY'S RESPONSE TO PLAN OF CARE: Patient alert and oriented x3, however due to lack of hearing aid is not able to fully understand questions presented.   Patient daughter is his HCPOA and documents are on the chart.  Patient daughter states that patient wife will be updated by phone of patient discharge plans. Patient and family agreeable with placement at [facility] once medically ready.  Patient daughter verbalized her appreciation for CSW support and involvement.

## 2013-02-21 NOTE — Progress Notes (Signed)
Subjective: Patient sitting up in chair, looking for to breakfast. CT of brain this morning shows some residual pneumocephalus and recurrent subdural collection on the left side, as well as overall increase in the right subdural hematoma as compared to admission, but no significant change as compared to 3 days ago.  Objective: Vital signs in last 24 hours: Filed Vitals:   02/21/13 0500 02/21/13 0600 02/21/13 0700 02/21/13 0753  BP: 100/56 90/49 83/52  99/59  Pulse:  75 74 79  Temp:      TempSrc:      Resp: 18 18 17 10   Height:      Weight:      SpO2:  95% 95% 97%    Intake/Output from previous day: 02/17 0701 - 02/18 0700 In: 723 [P.O.:720; I.V.:3] Out: 470 [Urine:315; Drains:155] Intake/Output this shift:    Physical Exam:  Awake and alert, following commands. Moving all 4 extremities well. Wound healing well.   Studies/Results: Ct Head Wo Contrast  02/21/2013   CLINICAL DATA:  Follow-up evaluation.  EXAM: CT HEAD WITHOUT CONTRAST  TECHNIQUE: Contiguous axial images were obtained from the base of the skull through the vertex without intravenous contrast.  COMPARISON:  CT of the head February 18, 2013.  FINDINGS: Status post left frontal craniotomy, with interval removal of the left frontal extra-axial drain. Left frontal extra-axial air effaces the left frontal sulci with pointed appearance of the left frontal lobe.  The left holohemispheric extra-axial fluid collection was 8 mm now 14 mm, with degenerating blood products, no Re bleed. Right extra-axial fluid collection is 14 mm, relatively unchanged with degenerating blood products. 4 mm subdural hematoma along the right cerebellar tentorium.  3 mm left to right midline shift. No hydrocephalus. No acute large vascular territory infarct. No intraparenchymal hemorrhage. Basal cisterns are patent.  Mild calcific atherosclerosis of the carotid siphons. The visualized paranasal sinuses are well aerated. Minimal fluid density in the left  mastoid tip is unchanged.  IMPRESSION: Status post left frontal craniotomy with interval removal of the left frontal extra-axial drain, increase in size of left holohemispheric subdural hematoma now 14 mm, previously 8 mm without rebleed. Stable appearance of 14 mm right holohemispheric subdural hematoma with 4 mm small right cerebellar tentorium subdural hematoma.  New left frontal extra-axial pneumocephalus with mild mass effect on the left frontal lobe concerning for tension pneumocephalus.   Electronically Signed   By: Elon Alas   On: 02/21/2013 04:09    Assessment/Plan: Stable following removal of subdural drain yesterday. We'll most likely remove staples tomorrow. Continuing PT. Encourage by mouth intake.   Hosie Spangle, MD 02/21/2013, 8:12 AM

## 2013-02-22 MED ORDER — MAGIC MOUTHWASH: 5.0000 mL | Freq: Three times a day (TID) | ORAL | Status: DC | PRN

## 2013-02-22 NOTE — Progress Notes (Signed)
Subjective: Patient resting in bed, denies complaints.  Objective: Vital signs in last 24 hours: Filed Vitals:   02/22/13 0348 02/22/13 0400 02/22/13 0500 02/22/13 0600  BP:  90/55 94/61 101/58  Pulse:  74 74 74  Temp: 99.7 F (37.6 C)     TempSrc: Oral     Resp:  19 20 19   Height:      Weight:      SpO2:  96% 96% 96%    Intake/Output from previous day: 02/18 0701 - 02/19 0700 In: 660 [P.O.:660] Out: 325 [Urine:325] Intake/Output this shift:    Physical Exam:  Awake and alert[facility] [place]. Following commands. Moving all 4 extremities well. Wound healing well, no erythema, swelling, or drainage.  Assessment/Plan: Patient neurologically stable. We'll have nursing staff remove staples today. Scheduled to meet with the patient's daughter later today to discuss disposition.   Hosie Spangle, MD 02/22/2013, 7:37 AM

## 2013-02-22 NOTE — Discharge Summary (Signed)
Physician Discharge Summary  Patient ID: Reginald [name] MRN: [id number] DOB/AGE: 07-18-1924 80 y.o.  Admit date: 02/12/2013 Discharge date: 02/22/2013  Admission Diagnoses:  Bilateral subacute and chronic hemispheric subdural hematomas  Discharge Diagnoses:  Bilateral subacute and chronic hemispheric subdural hematoma  Active Problems:   Subdural hematoma   Preoperative clearance  Discharged Condition: good  Hospital Course: Patient was admitted in transfer from the [facility] emergency room after having been diagnosed with bilateral hemispheric subdural hematomas. Patient has a history of pacemaker followed by Dr. Sallyanne Kuster at [facility] heart care. Patient was taken to surgery one week ago for evacuation of the larger left hemispheric hematoma. Postoperatively he has steadily recovered. There has been some enlargement of the right hemispheric subdural hematoma (which was anticipated), as well as some reaccumulation of the left hemispheric subdural hematoma. However CT scan shows that overall the mass effect and shift have diminished. Neurologically the patient is awake and alert, oriented to his name, [name]. However he has had episodic confusion and disorientation, associated with some mild agitation. However the CT scan shows the subdural collections have been stable and for now our plan is to have continue the program of rehabilitation and to followup with me in my office with a repeat CT scan of the brain other day the appointment, in 2 weeks. He has been seen by physical therapy here in the hospital, and was also seen in preoperative cardiology consultation by Dr. Golden Hurter. Further his pacemaker was interrogated by the [employer] staff who reported that it was functioning well and had good battery life. At this point the patient is moving all 4 extremities well. He is eating well. He is ambulating using a rolling rocker, with minimal assistance. I've  discussed with his daughter and other family members his current condition and her plans for further followup and care. They understand that he may require further cranial surgery either for the right hemispheric subdural hematoma, or for the reaccumulated left hemisphere subdural hematoma, but neither is required at this time.  At this point he can shower with assistance, and his scalp can be washed, although I would hold off on washing over the incision area for another week. He did ambulate with assistance, and then progress as indicated by the treating therapist's. His daughter has some questions regarding his depression management, I have recommended that she have his primary physician followup with him once he is at the nursing center.  Consults: cardiology, physical therapy  Discharge Exam: Blood pressure 96/66, pulse 75, temperature 99.7 F (37.6 C), temperature source Oral, resp. rate 17, height 5\' 7"  (1.702 m), weight 60.1 kg (132 lb 7.9 oz), SpO2 95.00%.  Disposition: [facility] (SNF)   Future Appointments Provider Department Dept Phone   05/22/2013 11:30 AM Rogene Houston, MD [place] [phone]       Medication List    STOP taking these medications       aspirin 81 MG tablet      TAKE these medications       amiodarone 200 MG tablet  Commonly known as:  PACERONE  Take 200 mg by mouth daily.     docusate sodium 100 MG capsule  Commonly known as:  COLACE  Take 100 mg by mouth daily as needed for mild constipation.     donepezil 10 MG tablet  Commonly known as:  ARICEPT  Take 10 mg by mouth daily.     levothyroxine  150 MCG tablet  Commonly known as:  SYNTHROID, LEVOTHROID  Take 150 mcg by mouth daily before breakfast.     magic mouthwash Soln  Take 5 mLs by mouth 3 (three) times daily as needed for mouth pain.     Melatonin 10 MG Tbcr  Take 10 mg by mouth at bedtime.     midodrine 5 MG tablet  Commonly known as:   PROAMATINE  Take 5 mg by mouth 2 (two) times daily. Take one tablet in the morning then take the second dose 4 hours later (early afternoon).     nitroGLYCERIN 0.4 MG SL tablet  Commonly known as:  NITROSTAT  Place 0.4 mg under the tongue every 5 (five) minutes as needed. For chest pain.     OLANZapine 5 MG tablet  Commonly known as:  ZYPREXA  Take 5 mg by mouth daily.     triamcinolone lotion 0.1 %  Commonly known as:  KENALOG  Apply 1 application topically daily.     Vitamin D (Cholecalciferol) 1000 UNITS Tabs  Take 1,000 Units by mouth daily.         Signed: Hosie Spangle, MD 02/22/2013, 12:35 PM

## 2013-02-22 NOTE — Clinical Social Work Note (Signed)
Clinical Social Worker facilitated patient discharge including contacting patient family and facility to confirm patient discharge plans.  Clinical information faxed to facility and family agreeable with plan.  CSW arranged ambulance transport via [facility] to [facility].  RN to call report prior to discharge.  Clinical Social Worker will sign off for now as social work intervention is no longer needed. Please consult [place] again if new need arises.  Barbette Or, [place]

## 2013-02-22 NOTE — Progress Notes (Signed)
Report called to [facility] SNF at [phone]. Given to [employer], Nursing. Daughter aware of plan for transport from [facility] to [place] by [place].

## 2013-02-22 NOTE — Progress Notes (Signed)
Physical Therapy Treatment Patient Details Name: Reginald [name] MRN: [id number] DOB: [birth year] Today's Date: 02/22/2013 Time: 1245-8099 PT Time Calculation (min): 24 min  PT Assessment / Plan / Recommendation  History of Present Illness Pt s/p CRANIOTOMY HEMATOMA EVACUATION SUBDURAL:  Left frontal temporoparietal craniotomy and evacuation of subdural hematoma   PT Comments   Pt progressing with ambulation. Continues to demo balance deficits and decr safety awareness with mobility. Pt is a fall risk at this time. Continue to recommend SNF for post acute rehab. Pt is progressing slowly with mobility. Max cues for safety and hand placement throughout session.   Follow Up Recommendations  SNF;Supervision/Assistance - 24 hour     Does the patient have the potential to tolerate intense rehabilitation     Barriers to Discharge        Equipment Recommendations  Other (comment) (TBD at SNF )    Recommendations for Other Services OT consult  Frequency Min 4X/week   Progress towards PT Goals Progress towards PT goals: Progressing toward goals  Plan Current plan remains appropriate    Precautions / Restrictions Precautions Precautions: Fall Restrictions Weight Bearing Restrictions: No   Pertinent Vitals/Pain Stable t/o session. No c/o pain.     Mobility  Bed Mobility Overal bed mobility: Needs Assistance Bed Mobility: Supine to Sit Supine to sit: Min assist General bed mobility comments: pt requires incr time and (A) to elevate trunk to sitting position at EOB; max cues for sequencing  Transfers Overall transfer level: Needs assistance Equipment used: Rolling walker (2 wheeled) Transfers: Sit to/from Stand Sit to Stand: Min assist General transfer comment: pt demo decreased safety awareness with transfers; max cues for hand placement and safety; (A) to maintain balance  Ambulation/Gait Ambulation/Gait assistance: Min assist;+2 safety/equipment Ambulation Distance (Feet): 80  Feet Assistive device: Rolling walker (2 wheeled) Gait Pattern/deviations: Decreased stride length;Step-through pattern;Shuffle;Wide base of support;Drifts right/left Gait velocity: slow Gait velocity interpretation: <1.8 ft/sec, indicative of risk for recurrent falls General Gait Details: pt veering Rt when ambulating with RW today; 2 person for safety; pt had 1 LOB and required (A) to maintain; posterior bias throughout ambulation; (A) to manage RW; max directional cues for safety; at end of session pt LEs were buckling secondary to fatigue     Exercises General Exercises - Lower Extremity Ankle Circles/Pumps: AROM;Both;10 reps;Seated   PT Diagnosis:    PT Problem List:   PT Treatment Interventions:     PT Goals (current goals can now be found in the care plan section) Acute Rehab PT Goals Patient Stated Goal: none stated PT Goal Formulation: With patient/family Time For Goal Achievement: 03/05/13 Potential to Achieve Goals: Good  Visit Information  Last PT Received On: 02/22/13 Assistance Needed: +2 (for safety with ambulation) History of Present Illness: Pt s/p CRANIOTOMY HEMATOMA EVACUATION SUBDURAL:  Left frontal temporoparietal craniotomy and evacuation of subdural hematoma    Subjective Data  Subjective: pt lying supine; c/o back pain. states "Sure i want to walk"  Patient Stated Goal: none stated   Cognition  Cognition Arousal/Alertness: Awake/alert Behavior During Therapy: WFL for tasks assessed/performed Overall Cognitive Status: Impaired/Different from baseline Area of Impairment: Problem solving;Memory;Safety/judgement Memory: Decreased short-term memory Safety/Judgement: Decreased awareness of deficits;Decreased awareness of safety Problem Solving: Slow processing;Decreased initiation;Difficulty sequencing;Requires verbal cues;Requires tactile cues General Comments: pt with insight when LE are weak and request to sit down    Balance  Balance Overall balance  assessment: Needs assistance Sitting-balance support: Bilateral upper extremity supported;Feet supported Sitting balance-Leahy Scale:  Fair Sitting balance - Comments: posterior bias  Postural control: Posterior lean Standing balance support: During functional activity;Bilateral upper extremity supported Standing balance-Leahy Scale: Poor Standing balance comment: requires (A) and bil UE supported by RW   End of Session PT - End of Session Equipment Utilized During Treatment: Gait belt Activity Tolerance: Patient tolerated treatment well Patient left: in chair;with call bell/phone within reach;with chair alarm set;with nursing/sitter in room Nurse Communication: Mobility status;Precautions   GP     Gustavus Bryant, [place] 02/22/2013, 10:34 AM

## 2013-04-18 ENCOUNTER — Other Ambulatory Visit: Payer: Self-pay | Admitting: Neurosurgery

## 2013-04-18 DIAGNOSIS — I6200 Nontraumatic subdural hemorrhage, unspecified: Secondary | ICD-10-CM

## 2013-04-25 ENCOUNTER — Ambulatory Visit
Admission: RE | Admit: 2013-04-25 | Discharge: 2013-04-25 | Disposition: A | Discharge status: Home / Self Care | Payer: Medicare Other | Source: Ambulatory Visit | Attending: Neurosurgery | Admitting: Neurosurgery

## 2013-04-25 DIAGNOSIS — I6200 Nontraumatic subdural hemorrhage, unspecified: Secondary | ICD-10-CM

## 2013-04-30 ENCOUNTER — Other Ambulatory Visit: Payer: Self-pay

## 2013-04-30 MED ORDER — AMIODARONE HCL 200 MG PO TABS: 100.0000 mg | ORAL_TABLET | Freq: Every day | ORAL | Status: DC

## 2013-04-30 NOTE — Telephone Encounter (Signed)
Rx was sent to pharmacy electronically. 

## 2013-05-22 ENCOUNTER — Ambulatory Visit (INDEPENDENT_AMBULATORY_CARE_PROVIDER_SITE_OTHER): Payer: Medicare Other | Admitting: Internal Medicine

## 2013-06-01 ENCOUNTER — Ambulatory Visit (INDEPENDENT_AMBULATORY_CARE_PROVIDER_SITE_OTHER): Payer: Medicare Other | Admitting: Urology

## 2013-06-01 DIAGNOSIS — N403 Nodular prostate with lower urinary tract symptoms: Secondary | ICD-10-CM

## 2013-06-01 DIAGNOSIS — R350 Frequency of micturition: Secondary | ICD-10-CM

## 2013-06-01 DIAGNOSIS — N138 Other obstructive and reflux uropathy: Secondary | ICD-10-CM

## 2013-06-01 DIAGNOSIS — R82998 Other abnormal findings in urine: Secondary | ICD-10-CM

## 2013-06-06 ENCOUNTER — Encounter: Payer: Self-pay | Admitting: Cardiology

## 2013-06-16 ENCOUNTER — Telehealth: Payer: Self-pay | Admitting: Cardiovascular Disease

## 2013-06-16 ENCOUNTER — Encounter: Payer: Self-pay | Admitting: Cardiovascular Disease

## 2013-06-18 NOTE — Telephone Encounter (Signed)
Closed encounter °

## 2013-07-12 ENCOUNTER — Encounter: Payer: Self-pay | Admitting: *Deleted

## 2013-07-17 ENCOUNTER — Ambulatory Visit (INDEPENDENT_AMBULATORY_CARE_PROVIDER_SITE_OTHER): Payer: Medicare Other | Admitting: Cardiovascular Disease

## 2013-07-17 ENCOUNTER — Encounter: Payer: Self-pay | Admitting: Cardiovascular Disease

## 2013-07-17 ENCOUNTER — Telehealth: Payer: Self-pay | Admitting: Cardiovascular Disease

## 2013-07-17 VITALS — BP 114/70 | HR 72 | Resp 16 | Ht 67.0 in | Wt 150.3 lb

## 2013-07-17 DIAGNOSIS — Z79899 Other long term (current) drug therapy: Secondary | ICD-10-CM

## 2013-07-17 DIAGNOSIS — R5381 Other malaise: Secondary | ICD-10-CM

## 2013-07-17 DIAGNOSIS — D689 Coagulation defect, unspecified: Secondary | ICD-10-CM

## 2013-07-17 DIAGNOSIS — I4891 Unspecified atrial fibrillation: Secondary | ICD-10-CM

## 2013-07-17 DIAGNOSIS — I480 Paroxysmal atrial fibrillation: Secondary | ICD-10-CM

## 2013-07-17 DIAGNOSIS — R5383 Other fatigue: Secondary | ICD-10-CM

## 2013-07-17 DIAGNOSIS — Z45018 Encounter for adjustment and management of other part of cardiac pacemaker: Secondary | ICD-10-CM

## 2013-07-17 DIAGNOSIS — I951 Orthostatic hypotension: Secondary | ICD-10-CM

## 2013-07-17 DIAGNOSIS — Z45010 Encounter for checking and testing of cardiac pacemaker pulse generator [battery]: Secondary | ICD-10-CM | POA: Insufficient documentation

## 2013-07-17 LAB — PACEMAKER DEVICE OBSERVATION

## 2013-07-17 LAB — MDC_IDC_ENUM_SESS_TYPE_INCLINIC
Battery Voltage: 2.81 V
Brady Statistic AP VP Percent: 1.4 %
Brady Statistic RV Percent Paced: 31.43 %
Lead Channel Impedance Value: 272 Ohm
Lead Channel Impedance Value: 472 Ohm
Lead Channel Pacing Threshold Amplitude: 1 V
Lead Channel Setting Pacing Amplitude: 2 V
Lead Channel Setting Pacing Amplitude: 2.5 V
Lead Channel Setting Sensing Sensitivity: 0.9 mV
MDC IDC MSMT LEADCHNL RA PACING THRESHOLD AMPLITUDE: 1 V
MDC IDC MSMT LEADCHNL RA PACING THRESHOLD PULSEWIDTH: 0.4 ms
MDC IDC MSMT LEADCHNL RV PACING THRESHOLD PULSEWIDTH: 0.4 ms
MDC IDC MSMT LEADCHNL RV SENSING INTR AMPL: 2.4147
MDC IDC SESS DTM: 20150714151544
MDC IDC SET LEADCHNL RV PACING PULSEWIDTH: 0.4 ms
MDC IDC STAT BRADY AP VS PERCENT: 64.01 %
MDC IDC STAT BRADY AS VP PERCENT: 30.03 %
MDC IDC STAT BRADY AS VS PERCENT: 4.56 %
MDC IDC STAT BRADY RA PERCENT PACED: 65.41 %
Zone Setting Detection Interval: 400 ms
Zone Setting Detection Interval: 400 ms

## 2013-07-17 LAB — APTT: aPTT: 32 seconds (ref 24–37)

## 2013-07-17 LAB — PROTIME-INR
INR: 1.18 (ref <1.50)
Prothrombin Time: 15 seconds (ref 11.6–15.2)

## 2013-07-17 NOTE — Patient Instructions (Addendum)
Your physician has recommended that you have a pacemaker generator change out. A pacemaker is a small device that is placed under the skin of your chest or abdomen to help control abnormal heart rhythms. This device uses electrical pulses to prompt the heart to beat at a normal rate. Pacemakers are used to treat heart rhythms that are too slow. Wire (leads) are attached to the pacemaker that goes into the chambers of you heart. This is done in the hospital and usually does not require an overnight stay.   Your physician recommends that you return for lab work within 7 days prior to procedure.

## 2013-07-17 NOTE — Telephone Encounter (Signed)
Left message for [name] regarding dual chamber pacer generator change scheduled for 07/24/13 @ 3:00pm with Dr. Sallyanne Kuster.

## 2013-07-17 NOTE — Progress Notes (Signed)
Patient ID: HILARY PUNDT, male   DOB: May 28, 1924, 80 y.o.   MRN: [id number]      Reason for office visit Pacemaker at [facility]   Mr. Longest presents today for pacemaker followup and his device was found to have reached elective replacement indicator on May 22. The device switched to VVI mode at that point. He had previously had virtually 100% atrial pacing, with one-to-one native AV conduction. The device now shows greater than 90% ventricular pacing. In addition there is noted a marked increase in the frequency and overall burden of atrial fibrillation, which previously had been sporadic.  He has not been aware of any palpitations and does not seem to have any deterioration in his clinical status when the device switched to VVI mode.  In February of this year he was diagnosed with bilateral subacute and chronic hemispheric subdural hematomas. His warfarin anticoagulation for atrial fibrillation had already been stopped in November of 2014 due to frequent falls. These are related to orthostatic hypotension, requiring treatment with ProAmatine Thankfully, he has recovered quite well from surgical evacuation of the subdural hematomas, although his gait remains unsteady. He has not had any recent falls. His memory has continued to slowly deteriorate.  No Known Allergies  Current Outpatient Prescriptions  Medication Sig Dispense Refill  . amiodarone (PACERONE) 200 MG tablet Take 0.5 tablets (100 mg total) by mouth daily.  15 tablet  5  . donepezil (ARICEPT) 10 MG tablet Take 10 mg by mouth daily.       Marland Kitchen levothyroxine (SYNTHROID, LEVOTHROID) 150 MCG tablet Take 150 mcg by mouth daily before breakfast.      . Melatonin 10 MG TBCR Take 10 mg by mouth at bedtime.      . midodrine (PROAMATINE) 5 MG tablet Take 5 mg by mouth daily. Take one tablet in the morning then take the second dose 4 hours later (early afternoon).      . nitroGLYCERIN (NITROSTAT) 0.4 MG SL tablet Place 0.4 mg under the tongue every 5  (five) minutes as needed. For chest pain.      Marland Kitchen OLANZapine (ZYPREXA) 5 MG tablet Take 5 mg by mouth daily.      Marland Kitchen triamcinolone lotion (KENALOG) 0.1 % Apply 1 application topically daily.       . Vitamin D, Cholecalciferol, 1000 UNITS TABS Take 1,000 Units by mouth daily.      . isosorbide mononitrate (IMDUR) 30 MG 24 hr tablet Take 30 mg by mouth daily.       No current facility-administered medications for this visit.    Past Medical History  Diagnosis Date  . Arrhythmia     afib  . Hyperlipidemia   . Syncope and collapse   . Vertigo   . BPH (benign prostatic hyperplasia)   . Anxiety neurosis   . Atrial fib/flutter, transient   . Coronary artery disease     MINOR  . Orthostatic hypotension   . OBS (organic brain syndrome)     Past Surgical History  Procedure Laterality Date  . Appendectomy    . Pulse generator implant    . Interrogation      atrial and ventricular  . Enrhythm generator      implant of a medtronic generator  . Colonoscopy  10/20/2011    Procedure: COLONOSCOPY;  Surgeon: Rogene Houston, MD;  [facility];  Service: Endoscopy;  Laterality: N/A;  . Craniotomy N/A 02/15/2013    Procedure: CRANIOTOMY HEMATOMA EVACUATION SUBDURAL;  Surgeon:  Hosie Spangle, MD;  Location: [facility];  Service: Neurosurgery;  Laterality: N/A;  . Doppler echocardiography N/A 02/01/12    LV CAVITY SIZE IS NORMAL. MILD CONCENTRIC HYPERTROPHY. EF 55-60%. PARAMETERS ARE CONSISTANT WITH ABNORMAL LV RELAXATION.(GRADE 1 DIASTOLIC DYSFUNCTION). MR. LEFT ATRIUM MODERATELY DILATED. RIGHT ATRIUM MILDLY DILATED. ATRIAL SEPTUM NORMAL. ATRIAL PACED RHYTHM WITH INTACT A-V CONDUCTION.  . Nuclear stress test N/A 03/18/09    NORMAL PATTERN OF PERFUSION IN ALL REGIONS. LEFT VENTRICULAR SIZE IS NORMAL. NO EVID OF INDUCIBLE ISCHEMIA. EF 77%. NORMAL MYOCARDIAL PERFUSION STUDY.  . Cardiac catheterization  06/17/97    FALSE/POSITIVE TREADMILL EXERCISE TEST. MILD CAD WITH NL LV FUNCTION. MILD  HYPERTENSION. FAMILY HX OF CAD, REMOTE SMOKER. PAST HX OF PAF.  Marland Kitchen Carotid duplex Bilateral 03/13/01    SM AMT OF NON HEMODYNAMICALLY SIGN PLAQUE IN THE RIGHT CAROTID BULB, <39% STENOSIS.    Family History  Problem Relation Age of Onset  . Cancer Mother   . Heart disease Father   . Cancer Sister     History   Social History  . Marital Status: Married    Spouse Name: N/A    Number of Children: N/A  . Years of Education: N/A   Occupational History  . Not on file.   Social History Main Topics  . Smoking status: Never Smoker   . Smokeless tobacco: Never Used  . Alcohol Use: No  . Drug Use: No  . Sexual Activity: Not on file   Other Topics Concern  . Not on file   Social History Narrative  . No narrative on file    Review of systems: His memory is moderately impaired.  The patient specifically denies any chest pain at rest or with exertion, dyspnea at rest or with exertion, orthopnea, paroxysmal nocturnal dyspnea, syncope, palpitations, focal neurological deficits, intermittent claudication, lower extremity edema, unexplained weight gain, cough, hemoptysis or wheezing.  The patient also denies abdominal pain, nausea, vomiting, dysphagia, diarrhea, constipation, polyuria, polydipsia, dysuria, hematuria, frequency, urgency, abnormal bleeding or bruising, fever, chills, unexpected weight changes, mood swings, change in skin or hair texture, change in voice quality, auditory or visual problems, allergic reactions or rashes, new musculoskeletal complaints other than usual "aches and pains".   PHYSICAL EXAM BP 114/70  Pulse 72  Resp 16  Ht 5\' 7"  (1.702 m)  Wt 150 lb 4.8 oz (68.176 kg)  BMI 23.53 kg/m2 General: Alert, oriented x3, no distress  Head: no evidence of trauma, PERRL, EOMI, no exophtalmos or lid lag, no myxedema, no xanthelasma; normal ears, nose and oropharynx  Neck: normal jugular venous pulsations and no hepatojugular reflux; brisk carotid pulses without delay and  no carotid bruits  Chest: clear to auscultation, no signs of consolidation by percussion or palpation, normal fremitus, symmetrical and full respiratory excursions left subclavian pacemaker site appears healthy  Cardiovascular: normal position and quality of the apical impulse, regular rhythm, normal first and second heart sounds, no murmurs, rubs or gallops  Abdomen: no tenderness or distention, no masses by palpation, no abnormal pulsatility or arterial bruits, normal bowel sounds, no hepatosplenomegaly  Extremities: no clubbing, cyanosis or edema; 2+ radial, ulnar and brachial pulses bilaterally; 2+ right femoral, posterior tibial and dorsalis pedis pulses; 2+ left femoral, posterior tibial and dorsalis pedis pulses; no subclavian or femoral bruits  Neurological: grossly nonfocal   BMET    Component Value Date/Time   NA 140 02/16/2013 0517   K 4.5 02/16/2013 0517   CL 108 02/16/2013 0517  CO2 19 02/16/2013 0517   GLUCOSE 98 02/16/2013 0517   BUN 19 02/16/2013 0517   CREATININE 1.20 02/16/2013 0517   CALCIUM 8.0* 02/16/2013 0517   GFRNONAA 53* 02/16/2013 0517   GFRAA 61* 02/16/2013 0517     ASSESSMENT AND PLAN  Mr. Koegel pacemaker has reached elective replacement indicator. Although he is not technically pacemaker dependent, he has a very slow underlying escape rhythm at 40 beats per minute. He requires dual chamber permanent pacemaker for symptomatic bradycardia. I suspect a recent increase in burden of atrial fibrillation is secondary to the loss of atrial pacing and the "switch" to VVI mode with asynchronous atrioventricular activity.  We'll schedule him for a pacemaker generator change out. Sensing on his ventricular lead is not perfect but is adequate. As long as he can receive atrial pacing he is very unlikely to require ventricular pacing.  This procedure has been fully reviewed with the patient and written informed consent has been obtained.  Despite an increased burden of paroxysmal  atrial fibrillation, he cannot receive anticoagulation due to orthostatic hypotension related to falls accompanied by serious injury including subdural hematoma.  Orders Placed This Encounter  Procedures  . CBC  . Basic metabolic panel  . APTT  . Protime-INR  . PACEMAKER GENERATOR CHANGE   Meds ordered this encounter  Medications  . isosorbide mononitrate (IMDUR) 30 MG 24 hr tablet    Sig: Take 30 mg by mouth daily.    Holli Humbles, MD, [place] [phone] office [phone] pager

## 2013-07-18 ENCOUNTER — Encounter: Payer: Self-pay | Admitting: Cardiovascular Disease

## 2013-07-18 ENCOUNTER — Other Ambulatory Visit: Payer: Self-pay | Admitting: *Deleted

## 2013-07-18 DIAGNOSIS — Z45010 Encounter for checking and testing of cardiac pacemaker pulse generator [battery]: Secondary | ICD-10-CM

## 2013-07-18 LAB — CBC
HCT: 31.2 % — ABNORMAL LOW (ref 39.0–52.0)
HEMOGLOBIN: 10.8 g/dL — AB (ref 13.0–17.0)
MCH: 28.3 pg (ref 26.0–34.0)
MCHC: 34.6 g/dL (ref 30.0–36.0)
MCV: 81.9 fL (ref 78.0–100.0)
Platelets: 143 10*3/uL — ABNORMAL LOW (ref 150–400)
RBC: 3.81 MIL/uL — ABNORMAL LOW (ref 4.22–5.81)
RDW: 15.9 % — ABNORMAL HIGH (ref 11.5–15.5)
WBC: 5.5 10*3/uL (ref 4.0–10.5)

## 2013-07-18 LAB — BASIC METABOLIC PANEL
BUN: 26 mg/dL — ABNORMAL HIGH (ref 6–23)
CO2: 25 mEq/L (ref 19–32)
Calcium: 8.9 mg/dL (ref 8.4–10.5)
Chloride: 108 mEq/L (ref 96–112)
Creat: 1.51 mg/dL — ABNORMAL HIGH (ref 0.50–1.35)
GLUCOSE: 128 mg/dL — AB (ref 70–99)
POTASSIUM: 4.4 meq/L (ref 3.5–5.3)
Sodium: 140 mEq/L (ref 135–145)

## 2013-07-19 ENCOUNTER — Encounter: Payer: Medicare Other | Admitting: Cardiovascular Disease

## 2013-07-19 ENCOUNTER — Encounter (HOSPITAL_COMMUNITY): Payer: Self-pay | Admitting: Pharmacy Technician

## 2013-07-23 DIAGNOSIS — Z79899 Other long term (current) drug therapy: Secondary | ICD-10-CM | POA: Diagnosis not present

## 2013-07-23 DIAGNOSIS — I4891 Unspecified atrial fibrillation: Secondary | ICD-10-CM | POA: Diagnosis not present

## 2013-07-23 DIAGNOSIS — Z45018 Encounter for adjustment and management of other part of cardiac pacemaker: Secondary | ICD-10-CM | POA: Diagnosis present

## 2013-07-23 DIAGNOSIS — E785 Hyperlipidemia, unspecified: Secondary | ICD-10-CM | POA: Diagnosis not present

## 2013-07-23 DIAGNOSIS — Z9181 History of falling: Secondary | ICD-10-CM | POA: Diagnosis not present

## 2013-07-23 DIAGNOSIS — F079 Unspecified personality and behavioral disorder due to known physiological condition: Secondary | ICD-10-CM | POA: Diagnosis not present

## 2013-07-23 MED ORDER — SODIUM CHLORIDE 0.9 % IJ SOLN: 3.0000 mL | INTRAMUSCULAR | Status: DC | PRN

## 2013-07-23 MED ORDER — SODIUM CHLORIDE 0.9 % IV SOLN
INTRAVENOUS | Status: DC
  Administered 2013-07-24: 14:00:00 via INTRAVENOUS

## 2013-07-23 MED ORDER — CEFAZOLIN SODIUM-DEXTROSE 2-3 GM-% IV SOLR: 2.0000 g | INTRAVENOUS | Status: AC

## 2013-07-23 MED ORDER — GENTAMICIN SULFATE 40 MG/ML IJ SOLN
80.0000 mg | INTRAMUSCULAR | Status: AC
  Filled 2013-07-23: qty 2

## 2013-07-24 ENCOUNTER — Encounter (HOSPITAL_COMMUNITY)
Admission: RE | Disposition: A | Discharge status: Home / Self Care | Payer: Self-pay | Source: Ambulatory Visit | Attending: Cardiovascular Disease

## 2013-07-24 ENCOUNTER — Ambulatory Visit (HOSPITAL_COMMUNITY)
Admission: RE | Admit: 2013-07-24 | Discharge: 2013-07-24 | Disposition: A | Discharge status: Home / Self Care | Payer: Medicare Other | Source: Ambulatory Visit | Attending: Cardiovascular Disease | Admitting: Cardiovascular Disease

## 2013-07-24 DIAGNOSIS — I4891 Unspecified atrial fibrillation: Secondary | ICD-10-CM | POA: Insufficient documentation

## 2013-07-24 DIAGNOSIS — R001 Bradycardia, unspecified: Secondary | ICD-10-CM

## 2013-07-24 DIAGNOSIS — Z45010 Encounter for checking and testing of cardiac pacemaker pulse generator [battery]: Secondary | ICD-10-CM

## 2013-07-24 DIAGNOSIS — R55 Syncope and collapse: Secondary | ICD-10-CM

## 2013-07-24 DIAGNOSIS — Z79899 Other long term (current) drug therapy: Secondary | ICD-10-CM | POA: Insufficient documentation

## 2013-07-24 DIAGNOSIS — Z45018 Encounter for adjustment and management of other part of cardiac pacemaker: Secondary | ICD-10-CM | POA: Diagnosis not present

## 2013-07-24 DIAGNOSIS — Z9181 History of falling: Secondary | ICD-10-CM | POA: Insufficient documentation

## 2013-07-24 DIAGNOSIS — Z950 Presence of cardiac pacemaker: Secondary | ICD-10-CM

## 2013-07-24 DIAGNOSIS — E785 Hyperlipidemia, unspecified: Secondary | ICD-10-CM | POA: Insufficient documentation

## 2013-07-24 DIAGNOSIS — F079 Unspecified personality and behavioral disorder due to known physiological condition: Secondary | ICD-10-CM | POA: Insufficient documentation

## 2013-07-24 HISTORY — PX: PACEMAKER GENERATOR CHANGE: SHX5481

## 2013-07-24 LAB — SURGICAL PCR SCREEN
MRSA, PCR: NEGATIVE
Staphylococcus aureus: NEGATIVE

## 2013-07-24 SURGERY — PACEMAKER GENERATOR CHANGE
Anesthesia: LOCAL

## 2013-07-24 MED ORDER — SODIUM CHLORIDE 0.9 % IV SOLN: INTRAVENOUS | Status: DC

## 2013-07-24 MED ORDER — MUPIROCIN 2 % EX OINT
1.0000 "application " | TOPICAL_OINTMENT | Freq: Once | CUTANEOUS | Status: AC
  Administered 2013-07-24: 1 via TOPICAL
  Filled 2013-07-24: qty 22

## 2013-07-24 MED ORDER — ONDANSETRON HCL 4 MG/2ML IJ SOLN: 4.0000 mg | Freq: Four times a day (QID) | INTRAMUSCULAR | Status: DC | PRN

## 2013-07-24 MED ORDER — LIDOCAINE HCL (PF) 1 % IJ SOLN
INTRAMUSCULAR | Status: AC
  Filled 2013-07-24: qty 30

## 2013-07-24 MED ORDER — ACETAMINOPHEN 325 MG PO TABS: 325.0000 mg | ORAL_TABLET | ORAL | Status: DC | PRN

## 2013-07-24 MED ORDER — MUPIROCIN 2 % EX OINT
TOPICAL_OINTMENT | CUTANEOUS | Status: AC
  Filled 2013-07-24: qty 22

## 2013-07-24 MED ORDER — LIDOCAINE HCL (PF) 1 % IJ SOLN
INTRAMUSCULAR | Status: AC
Start: 2013-07-24 — End: 2013-07-24
  Filled 2013-07-24: qty 30

## 2013-07-24 MED ORDER — CEFAZOLIN SODIUM-DEXTROSE 2-3 GM-% IV SOLR
INTRAVENOUS | Status: AC
  Filled 2013-07-24: qty 50

## 2013-07-24 NOTE — H&P (View-Only) (Signed)
Patient ID: Reginald [name], male   DOB: 08/04/1924, 80 y.o.   MRN: [id number]      Reason for office visit Pacemaker at [facility]   [name] presents today for pacemaker followup and his device was found to have reached elective replacement indicator on May 22. The device switched to VVI mode at that point. He had previously had virtually 100% atrial pacing, with one-to-one native AV conduction. The device now shows greater than 90% ventricular pacing. In addition there is noted a marked increase in the frequency and overall burden of atrial fibrillation, which previously had been sporadic.  He has not been aware of any palpitations and does not seem to have any deterioration in his clinical status when the device switched to VVI mode.  In February of this year he was diagnosed with bilateral subacute and chronic hemispheric subdural hematomas. His warfarin anticoagulation for atrial fibrillation had already been stopped in November of 2014 due to frequent falls. These are related to orthostatic hypotension, requiring treatment with ProAmatine Thankfully, he has recovered quite well from surgical evacuation of the subdural hematomas, although his gait remains unsteady. He has not had any recent falls. His memory has continued to slowly deteriorate.  No Known Allergies  Current Outpatient Prescriptions  Medication Sig Dispense Refill  . amiodarone (PACERONE) 200 MG tablet Take 0.5 tablets (100 mg total) by mouth daily.  15 tablet  5  . donepezil (ARICEPT) 10 MG tablet Take 10 mg by mouth daily.       Marland Kitchen levothyroxine (SYNTHROID, LEVOTHROID) 150 MCG tablet Take 150 mcg by mouth daily before breakfast.      . Melatonin 10 MG TBCR Take 10 mg by mouth at bedtime.      . midodrine (PROAMATINE) 5 MG tablet Take 5 mg by mouth daily. Take one tablet in the morning then take the second dose 4 hours later (early afternoon).      . nitroGLYCERIN (NITROSTAT) 0.4 MG SL tablet Place 0.4 mg under the tongue every 5  (five) minutes as needed. For chest pain.      Marland Kitchen OLANZapine (ZYPREXA) 5 MG tablet Take 5 mg by mouth daily.      Marland Kitchen triamcinolone lotion (KENALOG) 0.1 % Apply 1 application topically daily.       . Vitamin D, Cholecalciferol, 1000 UNITS TABS Take 1,000 Units by mouth daily.      . isosorbide mononitrate (IMDUR) 30 MG 24 hr tablet Take 30 mg by mouth daily.       No current facility-administered medications for this visit.    Past Medical History  Diagnosis Date  . Arrhythmia     afib  . Hyperlipidemia   . Syncope and collapse   . Vertigo   . BPH (benign prostatic hyperplasia)   . Anxiety neurosis   . Atrial fib/flutter, transient   . Coronary artery disease     MINOR  . Orthostatic hypotension   . OBS (organic brain syndrome)     Past Surgical History  Procedure Laterality Date  . Appendectomy    . Pulse generator implant    . Interrogation      atrial and ventricular  . Enrhythm generator      implant of a medtronic generator  . Colonoscopy  10/20/2011    Procedure: COLONOSCOPY;  Surgeon: Rogene Houston, MD;  [facility];  Service: Endoscopy;  Laterality: N/A;  . Craniotomy N/A 02/15/2013    Procedure: CRANIOTOMY HEMATOMA EVACUATION SUBDURAL;  Surgeon:  Hosie Spangle, MD;  Location: [place];  Service: Neurosurgery;  Laterality: N/A;  . Doppler echocardiography N/A 02/01/12    LV CAVITY SIZE IS NORMAL. MILD CONCENTRIC HYPERTROPHY. EF 55-60%. PARAMETERS ARE CONSISTANT WITH ABNORMAL LV RELAXATION.(GRADE 1 DIASTOLIC DYSFUNCTION). MR. LEFT ATRIUM MODERATELY DILATED. RIGHT ATRIUM MILDLY DILATED. ATRIAL SEPTUM NORMAL. ATRIAL PACED RHYTHM WITH INTACT A-V CONDUCTION.  . Nuclear stress test N/A 03/18/09    NORMAL PATTERN OF PERFUSION IN ALL REGIONS. LEFT VENTRICULAR SIZE IS NORMAL. NO EVID OF INDUCIBLE ISCHEMIA. EF 77%. NORMAL MYOCARDIAL PERFUSION STUDY.  . Cardiac catheterization  06/17/97    FALSE/POSITIVE TREADMILL EXERCISE TEST. MILD CAD WITH NL LV FUNCTION. MILD  HYPERTENSION. FAMILY HX OF CAD, REMOTE SMOKER. PAST HX OF PAF.  Marland Kitchen Carotid duplex Bilateral 03/13/01    SM AMT OF NON HEMODYNAMICALLY SIGN PLAQUE IN THE RIGHT CAROTID BULB, <39% STENOSIS.    Family History  Problem Relation Age of Onset  . Cancer Mother   . Heart disease Father   . Cancer Sister     History   Social History  . Marital Status: Married    Spouse Name: N/A    Number of Children: N/A  . Years of Education: N/A   Occupational History  . Not on file.   Social History Main Topics  . Smoking status: Never Smoker   . Smokeless tobacco: Never Used  . Alcohol Use: No  . Drug Use: No  . Sexual Activity: Not on file   Other Topics Concern  . Not on file   Social History Narrative  . No narrative on file    Review of systems: His memory is moderately impaired.  The patient specifically denies any chest pain at rest or with exertion, dyspnea at rest or with exertion, orthopnea, paroxysmal nocturnal dyspnea, syncope, palpitations, focal neurological deficits, intermittent claudication, lower extremity edema, unexplained weight gain, cough, hemoptysis or wheezing.  The patient also denies abdominal pain, nausea, vomiting, dysphagia, diarrhea, constipation, polyuria, polydipsia, dysuria, hematuria, frequency, urgency, abnormal bleeding or bruising, fever, chills, unexpected weight changes, mood swings, change in skin or hair texture, change in voice quality, auditory or visual problems, allergic reactions or rashes, new musculoskeletal complaints other than usual "aches and pains".   PHYSICAL EXAM BP 114/70  Pulse 72  Resp 16  Ht 5\' 7"  (1.702 m)  Wt 150 lb 4.8 oz (68.176 kg)  BMI 23.53 kg/m2 General: Alert, oriented x3, no distress  Head: no evidence of trauma, PERRL, EOMI, no exophtalmos or lid lag, no myxedema, no xanthelasma; normal ears, nose and oropharynx  Neck: normal jugular venous pulsations and no hepatojugular reflux; brisk carotid pulses without delay and  no carotid bruits  Chest: clear to auscultation, no signs of consolidation by percussion or palpation, normal fremitus, symmetrical and full respiratory excursions left subclavian pacemaker site appears healthy  Cardiovascular: normal position and quality of the apical impulse, regular rhythm, normal first and second heart sounds, no murmurs, rubs or gallops  Abdomen: no tenderness or distention, no masses by palpation, no abnormal pulsatility or arterial bruits, normal bowel sounds, no hepatosplenomegaly  Extremities: no clubbing, cyanosis or edema; 2+ radial, ulnar and brachial pulses bilaterally; 2+ right femoral, posterior tibial and dorsalis pedis pulses; 2+ left femoral, posterior tibial and dorsalis pedis pulses; no subclavian or femoral bruits  Neurological: grossly nonfocal   BMET    Component Value Date/Time   NA 140 02/16/2013 0517   K 4.5 02/16/2013 0517   CL 108 02/16/2013 0517  CO2 19 02/16/2013 0517   GLUCOSE 98 02/16/2013 0517   BUN 19 02/16/2013 0517   CREATININE 1.20 02/16/2013 0517   CALCIUM 8.0* 02/16/2013 0517   GFRNONAA 53* 02/16/2013 0517   GFRAA 61* 02/16/2013 0517     ASSESSMENT AND PLAN  Mr. Ursua pacemaker has reached elective replacement indicator. Although he is not technically pacemaker dependent, he has a very slow underlying escape rhythm at 40 beats per minute. He requires dual chamber permanent pacemaker for symptomatic bradycardia. I suspect a recent increase in burden of atrial fibrillation is secondary to the loss of atrial pacing and the "switch" to VVI mode with asynchronous atrioventricular activity.  We'll schedule him for a pacemaker generator change out. Sensing on his ventricular lead is not perfect but is adequate. As long as he can receive atrial pacing he is very unlikely to require ventricular pacing.  This procedure has been fully reviewed with the patient and written informed consent has been obtained.  Despite an increased burden of paroxysmal  atrial fibrillation, he cannot receive anticoagulation due to orthostatic hypotension related to falls accompanied by serious injury including subdural hematoma.  Orders Placed This Encounter  Procedures  . CBC  . Basic metabolic panel  . APTT  . Protime-INR  . PACEMAKER GENERATOR CHANGE   Meds ordered this encounter  Medications  . isosorbide mononitrate (IMDUR) 30 MG 24 hr tablet    Sig: Take 30 mg by mouth daily.    Holli Humbles, MD, [place] [phone] office [phone] pager

## 2013-07-24 NOTE — Progress Notes (Signed)
Discharge instruction given to pt and CG per MD order.  CG able to verbalize understanding.  Pt to car via wheelchair

## 2013-07-24 NOTE — Progress Notes (Signed)
Received pt from cath procedure alert and able to tolerate fluids. 

## 2013-07-24 NOTE — Discharge Instructions (Signed)
Supplemental Discharge Instructions for  Pacemaker/Defibrillator Patients  Activity Do not raise your left/right arm above shoulder level or extend it backward beyond shoulder level for 2 weeks. Wear the arm sling as a reminder or as needed for comfort for 2 weeks. No heavy lifting or vigorous activity with your left/right arm for 6-8 weeks.    NO DRIVING is preferable for 2 weeks; If absolutely necessary, drive only short, familiar routes. DO wear your seatbelt, even if it crosses over the pacemaker site.  WOUND CARE   Keep the wound area clean and dry.  Remove the dressing the day after you return home (usually 48 hours after the procedure).   DO NOT SUBMERGE UNDER WATER UNTIL FULLY HEALED (no tub baths, hot tubs, swimming pools, etc.).    You  may shower or take a sponge bath after the dressing is removed. DO NOT SOAK the area and do not allow the shower to directly spray on the site.   If you have staples, these will be removed in the office in 7-14 days.   If you have tape/steri-strips on your wound, these will fall off; do not pull them off prematurely.     Bioglue will gradually peel off naturally - do not scrape or pull off   No bandage is needed on the site.  DO  NOT apply any creams, oils, or ointments to the wound area.   If you notice any drainage or discharge from the wound, any swelling, excessive redness or bruising at the site, or if you develop a fever > 101? F after you are discharged home, call the office at once.  Special Instructions   You are still able to use cellular telephones.  Avoid carrying your cellular phone near your device.   When traveling through airports, show security personnel your identification card to avoid being screened in the metal detectors.    Avoid arc welding equipment, MRI testing (magnetic resonance imaging), TENS units (transcutaneous nerve stimulators).  Call the office for questions about other devices.   Avoid electrical appliances that are  in poor condition or are not properly grounded.   Microwave ovens are safe to be near or to operate.

## 2013-07-24 NOTE — Interval H&P Note (Signed)
History and Physical Interval Note:  07/24/2013 2:13 PM  Reginald [name]  has presented today for surgery, with the diagnosis of end of life  The various methods of treatment have been discussed with the patient and family. After consideration of risks, benefits and other options for treatment, the patient has consented to  Procedure(s): PACEMAKER GENERATOR CHANGE (N/A) as a surgical intervention .  The patient's history has been reviewed, patient examined, no change in status, stable for surgery.  I have reviewed the patient's chart and labs.  Questions were answered to the patient's satisfaction.     Landyn Lorincz

## 2013-07-24 NOTE — Op Note (Signed)
Procedure report  Procedure performed:  1. Dual chamber pacemaker generator changeout  2. Light sedation  Reason for procedure:  1. Device generator at elective replacement interval  Procedure performed by:  Sanda Klein, MD  Complications:  None  Estimated blood loss:  <5 mL  Medications administered during procedure:  Ancef 2 g intravenously, lidocaine 1% 30 mL locally Device details:   New Generator Medtronic Advisa model number DR, serial number [id number] Right atrial lead (chronic) Medtronic  [id number], serial [id number] (implanted 03/19/1999) Right ventricular lead (chronic)  Medtronic [id number],, serial number [id number] (implanted 03/19/1999)  Explanted generator Medtronic EnRhythm,  model number P1501DR, serial number  [id number] (implanted 08/30/2005)  Procedure details:  After the risks and benefits of the procedure were discussed the patient provided informed consent. She was brought to the cardiac catheter lab in the fasting state. The patient was prepped and draped in usual sterile fashion. Local anesthesia with 1% lidocaine was administered to to the left infraclavicular area. A 5-6cm horizontal incision was made parallel with and 2-3 cm caudal to the left clavicle, in the area of two older scars. Using minimal electrocautery and mostly sharp and blunt dissection the prepectoral pocket was opened carefully to avoid injury to the loops of chronic leads. Extensive dissection was not necessary. The device was explanted. The pocket was carefully inspected for hemostasis and flushed with copious amounts of antibiotic solution.  The leads were disconnected from the old generator and testing of the lead parameters later showed excellent values. The new generator was connected to the chronic leads, with appropriate pacing noted.   The entire system was then carefully inserted in the pocket with care been taking that the leads and device assumed a comfortable position without  pressure on the incision. Great care was taken that the leads be located deep to the generator. The pocket was then closed in layers using 2 layers of 2-0 Vicryl and Bioglue, after which a sterile dressing was applied.   At the end of the procedure the following lead parameters were encountered:   Right atrial lead sensed P waves 2.2 mV, impedance 372 ohms, threshold 0.9 at 0.5 ms pulse width.  Right ventricular lead sensed R waves  3.4 mV, impedance 607 ohms, threshold 1.1 at 0.5 ms pulse width.   Sanda Klein, MD, [facility] [phone] office [phone] pager

## 2013-07-26 ENCOUNTER — Telehealth: Payer: Self-pay | Admitting: Cardiovascular Disease

## 2013-07-27 ENCOUNTER — Telehealth: Payer: Self-pay | Admitting: Cardiovascular Disease

## 2013-07-27 MED ORDER — AMIODARONE HCL 200 MG PO TABS: 100.0000 mg | ORAL_TABLET | Freq: Every day | ORAL | Status: DC

## 2013-07-27 NOTE — Telephone Encounter (Signed)
Please call refill for patient's Amiodarone to [facility] in [place], [place].  Patient is out of medication.

## 2013-07-27 NOTE — Telephone Encounter (Signed)
Rx refill sent to patient pharmacy   

## 2013-07-27 NOTE — Telephone Encounter (Signed)
Closed encounter °

## 2013-08-06 ENCOUNTER — Ambulatory Visit (INDEPENDENT_AMBULATORY_CARE_PROVIDER_SITE_OTHER): Payer: Medicare Other | Admitting: *Deleted

## 2013-08-06 ENCOUNTER — Ambulatory Visit: Payer: Medicare Other | Admitting: Physician Assistant

## 2013-08-06 DIAGNOSIS — I4891 Unspecified atrial fibrillation: Secondary | ICD-10-CM

## 2013-08-06 DIAGNOSIS — I480 Paroxysmal atrial fibrillation: Secondary | ICD-10-CM

## 2013-08-06 LAB — MDC_IDC_ENUM_SESS_TYPE_INCLINIC
Battery Voltage: 3.09 V
Brady Statistic AP VS Percent: 86.95 %
Brady Statistic AS VP Percent: 1.59 %
Brady Statistic RA Percent Paced: 97.79 %
Brady Statistic RV Percent Paced: 12.43 %
Lead Channel Impedance Value: 266 Ohm
Lead Channel Impedance Value: 304 Ohm
Lead Channel Impedance Value: 513 Ohm
Lead Channel Pacing Threshold Amplitude: 0.875 V
Lead Channel Pacing Threshold Pulse Width: 0.4 ms
Lead Channel Pacing Threshold Pulse Width: 0.4 ms
Lead Channel Sensing Intrinsic Amplitude: 1.875 mV
Lead Channel Setting Pacing Amplitude: 2 V
Lead Channel Setting Pacing Amplitude: 2.5 V
Lead Channel Setting Pacing Pulse Width: 0.4 ms
MDC IDC MSMT LEADCHNL RA PACING THRESHOLD AMPLITUDE: 0.625 V
MDC IDC MSMT LEADCHNL RV IMPEDANCE VALUE: 475 Ohm
MDC IDC MSMT LEADCHNL RV SENSING INTR AMPL: 4.625 mV
MDC IDC SESS DTM: 20150803155506
MDC IDC SET LEADCHNL RV SENSING SENSITIVITY: 2 mV
MDC IDC STAT BRADY AP VP PERCENT: 10.84 %
MDC IDC STAT BRADY AS VS PERCENT: 0.62 %
Zone Setting Detection Interval: 400 ms
Zone Setting Detection Interval: 400 ms

## 2013-08-06 NOTE — Progress Notes (Signed)
Wound check appointment. Steri-strips removed. Wound without redness or edema. Incision edges approximated, wound well healed. Normal device function. Thresholds, sensing, and impedances consistent with implant measurements. Device programmed at 3.5V/auto capture programmed on for extra safety margin until 3 month visit. Histogram distribution appropriate for patient and level of activity. Pt in AF 2.0% of time. Longest episode was 51 minutes. + Warfarin. No high ventricular rates noted. Patient educated about wound care, arm mobility, lifting restrictions. ROV in 3 months with [facility].

## 2013-08-21 ENCOUNTER — Encounter: Payer: Self-pay | Admitting: Internal Medicine

## 2013-09-13 ENCOUNTER — Ambulatory Visit: Payer: Medicare Other | Admitting: Cardiovascular Disease

## 2013-10-29 ENCOUNTER — Ambulatory Visit (INDEPENDENT_AMBULATORY_CARE_PROVIDER_SITE_OTHER): Payer: Medicare Other | Admitting: Cardiovascular Disease

## 2013-10-29 ENCOUNTER — Encounter: Payer: Self-pay | Admitting: Cardiovascular Disease

## 2013-10-29 VITALS — BP 112/62 | HR 83 | Resp 16 | Ht 66.0 in | Wt 170.1 lb

## 2013-10-29 DIAGNOSIS — I4891 Unspecified atrial fibrillation: Secondary | ICD-10-CM

## 2013-10-29 DIAGNOSIS — R5383 Other fatigue: Secondary | ICD-10-CM

## 2013-10-29 DIAGNOSIS — I951 Orthostatic hypotension: Secondary | ICD-10-CM

## 2013-10-29 DIAGNOSIS — I10 Essential (primary) hypertension: Secondary | ICD-10-CM

## 2013-10-29 DIAGNOSIS — D509 Iron deficiency anemia, unspecified: Secondary | ICD-10-CM

## 2013-10-29 DIAGNOSIS — R55 Syncope and collapse: Secondary | ICD-10-CM

## 2013-10-29 DIAGNOSIS — R001 Bradycardia, unspecified: Secondary | ICD-10-CM

## 2013-10-29 DIAGNOSIS — Z79899 Other long term (current) drug therapy: Secondary | ICD-10-CM

## 2013-10-29 DIAGNOSIS — Z950 Presence of cardiac pacemaker: Secondary | ICD-10-CM

## 2013-10-29 NOTE — Patient Instructions (Signed)
Your physician recommends that you return for lab work in: Have blood work done at [facility] in [place] at [facility].  Remote monitoring is used to monitor your Pacemaker or ICD from home. This monitoring reduces the number of office visits required to check your device to one time per year. It allows [place] to monitor the functioning of your device to ensure it is working properly. You are scheduled for a device check from home on January 30, 2014. You may send your transmission at any time that day. If you have a wireless device, the transmission will be sent automatically. After your physician reviews your transmission, you will receive a postcard with your next transmission date.  Dr. Sallyanne Kuster recommends that you schedule a follow-up appointment in: 6 MONTHS.

## 2013-10-30 ENCOUNTER — Encounter: Payer: Self-pay | Admitting: Cardiovascular Disease

## 2013-10-30 NOTE — Progress Notes (Signed)
Patient ID: Reginald [name], male   DOB: 07/03/1924, 80 y.o.   MRN: [id number]      Reason for office visit Pacemaker follow-up, paroxysmal atrial fibrillation, symptomatic orthostatic hypotension  [name] is roughly 3 months status post a permanent pacemaker generator change out. He has long-standing sinus node dysfunction with symptomatic bradycardia and tachycardia bradycardia syndrome secondary to paroxysmal atrial fibrillation. The pacemaker site is well-healed and the device is functioning normally.  The underlying rhythm today sinus bradycardia at about 50 bpm. He paces the atrium virtually 100% of the time. There is only 3% ventricular pacing.  The device has recorded over 200 episodes of atrial tachycardia and atrial flutter in the last 3 months. The overall burden of tachyarrhythmia remains low, at less than 1%. It has had remarkably good efficacy at terminating these events with antitachycardia pacing. 192 of 203 episodes were successfully pace terminated. The longest episode lasted for only 17 minutes with atrial rates up to 320 bpm suggestive of paroxysmal atrial flutter. There have been no episodes of ventricular tachyarrhythmia or rapid ventricular response.  [name] does not have significant coronary disease and has preserved left ventricular systolic function but has a long-standing history of recurrent paroxysmal atrial fibrillation. This is generally well kept in check with low-dose amiodarone. He had been on warfarin for years, but this was discontinued secondary to subdural hematoma. In February of this year he was diagnosed with bilateral subacute and chronic hemispheric subdural hematomas. He does not have a history of stroke TIA or other embolic events.  [name] has evidence of slowly progressive dementia and lives with his daughter. He has become increasingly unsteady on his feet and has had numerous falls. He is extremely hard of hearing. He takes Midodrine for symptomatic  orthostatic hypotension.   No Known Allergies  Current Outpatient Prescriptions  Medication Sig Dispense Refill  . amiodarone (PACERONE) 200 MG tablet Take 0.5 tablets (100 mg total) by mouth daily.  15 tablet  5  . donepezil (ARICEPT) 10 MG tablet Take 10 mg by mouth daily.       . isosorbide mononitrate (IMDUR) 30 MG 24 hr tablet Take 30 mg by mouth daily.      Marland Kitchen latanoprost (XALATAN) 0.005 % ophthalmic solution Place 1 drop into the right eye at bedtime.      Marland Kitchen levothyroxine (SYNTHROID, LEVOTHROID) 100 MCG tablet Take 100 mcg by mouth daily before breakfast.      . midodrine (PROAMATINE) 5 MG tablet Take 5 mg by mouth daily.       . Multiple Vitamins-Minerals (MULTIVITAMIN WITH MINERALS) tablet Take 1 tablet by mouth daily.      . nitroGLYCERIN (NITROSTAT) 0.4 MG SL tablet Place 0.4 mg under the tongue every 5 (five) minutes as needed. For chest pain.      Marland Kitchen OLANZapine (ZYPREXA) 5 MG tablet Take 5 mg by mouth daily.      . Vitamin D, Cholecalciferol, 1000 UNITS TABS Take 1,000 Units by mouth daily.       No current facility-administered medications for this visit.    Past Medical History  Diagnosis Date  . Arrhythmia     afib  . Hyperlipidemia   . Syncope and collapse   . Vertigo   . BPH (benign prostatic hyperplasia)   . Anxiety neurosis   . Atrial fib/flutter, transient   . Coronary artery disease     MINOR  . Orthostatic hypotension   . OBS (organic brain syndrome)  Past Surgical History  Procedure Laterality Date  . Appendectomy    . Pulse generator implant    . Interrogation      atrial and ventricular  . Enrhythm generator      implant of a medtronic generator  . Colonoscopy  10/20/2011    Procedure: COLONOSCOPY;  Surgeon: Rogene Houston, MD;  Location: [facility];  Service: Endoscopy;  Laterality: N/A;  . Craniotomy N/A 02/15/2013    Procedure: CRANIOTOMY HEMATOMA EVACUATION SUBDURAL;  Surgeon: Hosie Spangle, MD;  Location: [facility];  Service:  Neurosurgery;  Laterality: N/A;  . Doppler echocardiography N/A 02/01/12    LV CAVITY SIZE IS NORMAL. MILD CONCENTRIC HYPERTROPHY. EF 55-60%. PARAMETERS ARE CONSISTANT WITH ABNORMAL LV RELAXATION.(GRADE 1 DIASTOLIC DYSFUNCTION). MR. LEFT ATRIUM MODERATELY DILATED. RIGHT ATRIUM MILDLY DILATED. ATRIAL SEPTUM NORMAL. ATRIAL PACED RHYTHM WITH INTACT A-V CONDUCTION.  . Nuclear stress test N/A 03/18/09    NORMAL PATTERN OF PERFUSION IN ALL REGIONS. LEFT VENTRICULAR SIZE IS NORMAL. NO EVID OF INDUCIBLE ISCHEMIA. EF 77%. NORMAL MYOCARDIAL PERFUSION STUDY.  . Cardiac catheterization  06/17/97    FALSE/POSITIVE TREADMILL EXERCISE TEST. MILD CAD WITH NL LV FUNCTION. MILD HYPERTENSION. FAMILY HX OF CAD, REMOTE SMOKER. PAST HX OF PAF.  Marland Kitchen Carotid duplex Bilateral 03/13/01    SM AMT OF NON HEMODYNAMICALLY SIGN PLAQUE IN THE RIGHT CAROTID BULB, <39% STENOSIS.    Family History  Problem Relation Age of Onset  . Cancer Mother   . Heart disease Father   . Cancer Sister     History   Social History  . Marital Status: Married    Spouse Name: N/A    Number of Children: N/A  . Years of Education: N/A   Occupational History  . Not on file.   Social History Main Topics  . Smoking status: Never Smoker   . Smokeless tobacco: Never Used  . Alcohol Use: No  . Drug Use: No  . Sexual Activity: Not on file   Other Topics Concern  . Not on file   Social History Narrative  . No narrative on file    Review of systems: His memory is moderately impaired. He sleeps a lot. The patient specifically denies any chest pain at rest or with exertion, dyspnea at rest or with exertion, orthopnea, paroxysmal nocturnal dyspnea, syncope, palpitations, focal neurological deficits, intermittent claudication, lower extremity edema, unexplained weight gain, cough, hemoptysis or wheezing.  The patient also denies abdominal pain, nausea, vomiting, dysphagia, diarrhea, constipation, polyuria, polydipsia, dysuria, hematuria,  frequency, urgency, abnormal bleeding or bruising, fever, chills, unexpected weight changes, mood swings, change in skin or hair texture, change in voice quality, auditory or visual problems, allergic reactions or rashes, new musculoskeletal complaints other than usual "aches and pains".   PHYSICAL EXAM BP 112/62  Pulse 83  Resp 16  Ht 5\' 6"  (1.676 m)  Wt 170 lb 1.6 oz (77.157 kg)  BMI 27.47 kg/m2 General: Alert, oriented x3, no distress  Head: no evidence of trauma, PERRL, EOMI, no exophtalmos or lid lag, no myxedema, no xanthelasma; normal ears, nose and oropharynx  Neck: normal jugular venous pulsations and no hepatojugular reflux; brisk carotid pulses without delay and no carotid bruits  Chest: clear to auscultation, no signs of consolidation by percussion or palpation, normal fremitus, symmetrical and full respiratory excursions left subclavian pacemaker site appears healthy  Cardiovascular: normal position and quality of the apical impulse, regular rhythm, normal first and second heart sounds, no murmurs, rubs or gallops  Abdomen: no tenderness or distention, no masses by palpation, no abnormal pulsatility or arterial bruits, normal bowel sounds, no hepatosplenomegaly  Extremities: no clubbing, cyanosis or edema; 2+ radial, ulnar and brachial pulses bilaterally; 2+ right femoral, posterior tibial and dorsalis pedis pulses; 2+ left femoral, posterior tibial and dorsalis pedis pulses; no subclavian or femoral bruits  Neurological: grossly nonfocal   EKG: Atrial paced ventricular sensed, early RS transition with a tall R wave in lead V2, consider old posterior infarction   BMET    Component Value Date/Time   NA 140 07/17/2013 1350   K 4.4 07/17/2013 1350   CL 108 07/17/2013 1350   CO2 25 07/17/2013 1350   GLUCOSE 128* 07/17/2013 1350   BUN 26* 07/17/2013 1350   CREATININE 1.51* 07/17/2013 1350   CREATININE 1.20 02/16/2013 0517   CALCIUM 8.9 07/17/2013 1350   GFRNONAA 53* 02/16/2013 0517     GFRAA 61* 02/16/2013 0517     ASSESSMENT AND PLAN ATRIAL FIBRILLATION  His overall burden of atrial fibrillation and other atrial tachyarrhythmia is quite low and each individual event is very brief, less than 20 minutes. He has never had a stroke or other embolic event. Overall, at this point in his life, the risk of bleeding complications and serious injury outweighs the risk of embolic events.Check thyroid and liver function tests at least twice yearly; he is due for those now. He has some clinical complaints concerning for hypothyroidism. ORTHOSTATIC HYPOTENSION  He has been taking this medication twice daily first in the morning and lasting at night, which is not appropriate. This medication should only be given when the patient is expected to be upright and this was discussed with the patient and his daughter today. I recommend that he take a dose of the medication first thing in the morning before he even gets out of bed and a second dose roughly 4 hours later. He should not life fully recumbent within 2 hours of taking this medication since it may cause significant supine hypertension.  Cardiac pacemaker with recent generator change Normal pacemaker function. He has essentially 100% atrial pacing and no ventricular pacing. He has had numerous episodes of atrial mode switch secondary to atrial fibrillation, but all these episodes are very brief. Most of them last for less than a minute, the longest one was less than 20 minutes in duration. The overall burden of atrial for relation is low%. Rate control during the episodes of atrial arrhythmia is acceptable.  Falls frequently  His gait has become increasingly unsteady.  Patient Instructions  Your physician recommends that you return for lab work in: Have blood work done at [facility] in [place] at [facility].  Remote monitoring is used to monitor your Pacemaker or ICD from home. This monitoring reduces the number of office visits  required to check your device to one time per year. It allows [place] to monitor the functioning of your device to ensure it is working properly. You are scheduled for a device check from home on January 30, 2014. You may send your transmission at any time that day. If you have a wireless device, the transmission will be sent automatically. After your physician reviews your transmission, you will receive a postcard with your next transmission date.  Dr. Sallyanne Kuster recommends that you schedule a follow-up appointment in: 6 MONTHS.         Orders Placed This Encounter  Procedures  . Comprehensive metabolic panel  . CBC  . TSH  . EKG  12-Lead  Justine Dines  Sanda Klein, MD, [facility] [phone] office [phone] pager

## 2013-11-06 LAB — COMPREHENSIVE METABOLIC PANEL
ALT: 18 U/L (ref 0–53)
AST: 27 U/L (ref 0–37)
Albumin: 4.1 g/dL (ref 3.5–5.2)
Alkaline Phosphatase: 78 U/L (ref 39–117)
BILIRUBIN TOTAL: 1.2 mg/dL (ref 0.2–1.2)
BUN: 23 mg/dL (ref 6–23)
CALCIUM: 9 mg/dL (ref 8.4–10.5)
CHLORIDE: 102 meq/L (ref 96–112)
CO2: 28 mEq/L (ref 19–32)
Creat: 1.51 mg/dL — ABNORMAL HIGH (ref 0.50–1.35)
Glucose, Bld: 94 mg/dL (ref 70–99)
Potassium: 4.3 mEq/L (ref 3.5–5.3)
SODIUM: 141 meq/L (ref 135–145)
Total Protein: 6.6 g/dL (ref 6.0–8.3)

## 2013-11-06 LAB — CBC
HEMATOCRIT: 33.3 % — AB (ref 39.0–52.0)
Hemoglobin: 11.7 g/dL — ABNORMAL LOW (ref 13.0–17.0)
MCH: 30.4 pg (ref 26.0–34.0)
MCHC: 35.1 g/dL (ref 30.0–36.0)
MCV: 86.5 fL (ref 78.0–100.0)
Platelets: 155 10*3/uL (ref 150–400)
RBC: 3.85 MIL/uL — ABNORMAL LOW (ref 4.22–5.81)
RDW: 14.9 % (ref 11.5–15.5)
WBC: 6 10*3/uL (ref 4.0–10.5)

## 2013-11-06 LAB — TSH: TSH: 0.769 u[IU]/mL (ref 0.350–4.500)

## 2013-11-07 LAB — MDC_IDC_ENUM_SESS_TYPE_INCLINIC
Battery Voltage: 3.04 V
Brady Statistic AP VP Percent: 3.1 %
Brady Statistic AP VS Percent: 96.3 %
Brady Statistic AS VP Percent: 0.4 %
Lead Channel Impedance Value: 266 Ohm
Lead Channel Pacing Threshold Amplitude: 0.5 V
Lead Channel Pacing Threshold Amplitude: 0.75 V
Lead Channel Pacing Threshold Pulse Width: 0.4 ms
Lead Channel Pacing Threshold Pulse Width: 0.4 ms
Lead Channel Sensing Intrinsic Amplitude: 1.5 mV
Lead Channel Sensing Intrinsic Amplitude: 2.6 mV
Lead Channel Setting Pacing Amplitude: 2 V
Lead Channel Setting Pacing Amplitude: 2.5 V
Lead Channel Setting Sensing Sensitivity: 2 mV
MDC IDC MSMT LEADCHNL RV IMPEDANCE VALUE: 494 Ohm
MDC IDC SET LEADCHNL RV PACING PULSEWIDTH: 0.4 ms
MDC IDC STAT BRADY AS VS PERCENT: 0.3 %
Zone Setting Detection Interval: 400 ms
Zone Setting Detection Interval: 400 ms

## 2013-11-12 ENCOUNTER — Encounter: Payer: Self-pay | Admitting: Cardiovascular Disease

## 2013-12-06 ENCOUNTER — Other Ambulatory Visit: Payer: Self-pay | Admitting: Cardiovascular Disease

## 2013-12-06 NOTE — Telephone Encounter (Signed)
Rx was sent to pharmacy electronically. 

## 2013-12-07 ENCOUNTER — Telehealth: Payer: Self-pay | Admitting: *Deleted

## 2013-12-07 NOTE — Telephone Encounter (Signed)
Called the pharmacy to change a refill sent electronically on 11/06/13 for Midodrine HCL 5 mg tablet twice daily; changed to once daily.

## 2013-12-13 ENCOUNTER — Encounter (HOSPITAL_COMMUNITY): Payer: Self-pay | Admitting: Cardiovascular Disease

## 2014-01-30 ENCOUNTER — Encounter: Payer: Medicare Other | Admitting: *Deleted

## 2014-01-30 ENCOUNTER — Encounter: Payer: Self-pay | Admitting: Cardiovascular Disease

## 2014-01-30 ENCOUNTER — Telehealth: Payer: Self-pay | Admitting: Cardiology

## 2014-01-30 NOTE — Telephone Encounter (Signed)
Confirmed remote transmission w/ pt daughter.   

## 2014-01-31 ENCOUNTER — Encounter: Payer: Self-pay | Admitting: Cardiology

## 2014-03-06 ENCOUNTER — Telehealth: Payer: Self-pay | Admitting: Cardiovascular Disease

## 2014-03-06 NOTE — Telephone Encounter (Signed)
Spoke with Reginald [name], the patient has been having these episodes for quite some time but they are becoming more frequent, can have 2 in one day. He will get an "odd feeling" in his chest that will run up his jaw and into his head. He reports it is not pain and he does not have any other symptoms, no SOB or heart racing. He does not relate to exertion and it goes away on its own. Will forward for dr croitoru's review

## 2014-03-06 NOTE — Telephone Encounter (Signed)
Ms. Reginald [name] is calling because [name] is stating that he is having an odd feeling that goes from his chest to his jaw then to his head. But lately it has been staying in his chest . Please call  Thanks

## 2014-03-06 NOTE — Telephone Encounter (Signed)
Not sure, but could be his way of perceiving his frequent episodes of arrhythmia. Please keep a detailed log of day and time when these occur and after one week do an extra download fom his pacemaker. Will compare his symptoms with any spells of abnormal rhythm, but need that detauiled symptom log MCr

## 2014-03-07 NOTE — Telephone Encounter (Signed)
Spoke to  Daughter, informed her instructions She states she had did a download on 03/04/14- not sure if transmission went through.   Daughter states she understands need detail log and then download in one week. RN informed daughter that device clinic will be notified to see if download was obtained RN spoke to [employer]---- device clinic Pamala Hurry stated download was not received Please have daughter - call tech support - 1 [phone]. RN will inform daughter.

## 2014-03-07 NOTE — Telephone Encounter (Signed)
RN spoke to daughter,--  Office fax number given to daughter. She states she will call tech support, when she get a chance- she is heading out to work. RN informed her to do it before next week download.

## 2014-03-08 ENCOUNTER — Telehealth: Payer: Self-pay | Admitting: Cardiovascular Disease

## 2014-03-08 NOTE — Telephone Encounter (Signed)
Please call,having some problems transmitting. They said they were not being picked up because of an error made by this office.Marland Kitchen

## 2014-03-12 NOTE — Telephone Encounter (Signed)
Spoke to Berry on 3/4. Informed her that transmissions were received and that pt has PAF, but nothing that matches the dates and times pt is having symptoms. Daughter voiced understanding. MC aware.

## 2014-03-14 ENCOUNTER — Telehealth: Payer: Self-pay | Admitting: Cardiovascular Disease

## 2014-03-14 NOTE — Telephone Encounter (Signed)
Pt been having some spells,just feeling odd.When he transmit they are good. Please call to advise,please call before 11 if possible.Marland Kitchen

## 2014-03-14 NOTE — Telephone Encounter (Signed)
Called [facility]. 

## 2014-03-21 ENCOUNTER — Ambulatory Visit (INDEPENDENT_AMBULATORY_CARE_PROVIDER_SITE_OTHER): Payer: Medicare Other | Admitting: Cardiovascular Disease

## 2014-03-21 ENCOUNTER — Encounter: Payer: Self-pay | Admitting: Cardiovascular Disease

## 2014-03-21 VITALS — BP 104/70 | HR 80 | Resp 20 | Ht 66.0 in | Wt 165.6 lb

## 2014-03-21 DIAGNOSIS — I495 Sick sinus syndrome: Secondary | ICD-10-CM

## 2014-03-21 DIAGNOSIS — R001 Bradycardia, unspecified: Secondary | ICD-10-CM

## 2014-03-21 DIAGNOSIS — I4891 Unspecified atrial fibrillation: Secondary | ICD-10-CM

## 2014-03-21 DIAGNOSIS — I951 Orthostatic hypotension: Secondary | ICD-10-CM

## 2014-03-21 DIAGNOSIS — Z950 Presence of cardiac pacemaker: Secondary | ICD-10-CM

## 2014-03-21 LAB — MDC_IDC_ENUM_SESS_TYPE_INCLINIC
Battery Remaining Longevity: 105 mo
Brady Statistic RV Percent Paced: 3.18 %
Date Time Interrogation Session: 20160317130849
Lead Channel Impedance Value: 266 Ohm
Lead Channel Impedance Value: 475 Ohm
Lead Channel Pacing Threshold Amplitude: 0.625 V
Lead Channel Pacing Threshold Amplitude: 0.75 V
Lead Channel Pacing Threshold Pulse Width: 0.4 ms
Lead Channel Pacing Threshold Pulse Width: 0.4 ms
Lead Channel Sensing Intrinsic Amplitude: 1.375 mV
Lead Channel Sensing Intrinsic Amplitude: 2.75 mV
Lead Channel Sensing Intrinsic Amplitude: 5 mV
Lead Channel Setting Pacing Amplitude: 2 V
Lead Channel Setting Pacing Amplitude: 2 V
Lead Channel Setting Sensing Sensitivity: 2 mV
MDC IDC MSMT BATTERY VOLTAGE: 3.02 V
MDC IDC MSMT LEADCHNL RA IMPEDANCE VALUE: 323 Ohm
MDC IDC MSMT LEADCHNL RV IMPEDANCE VALUE: 513 Ohm
MDC IDC MSMT LEADCHNL RV SENSING INTR AMPL: 4.125 mV
MDC IDC SET LEADCHNL RV PACING PULSEWIDTH: 0.4 ms
MDC IDC SET ZONE DETECTION INTERVAL: 400 ms
MDC IDC STAT BRADY AP VP PERCENT: 2.76 %
MDC IDC STAT BRADY AP VS PERCENT: 96.3 %
MDC IDC STAT BRADY AS VP PERCENT: 0.42 %
MDC IDC STAT BRADY AS VS PERCENT: 0.52 %
MDC IDC STAT BRADY RA PERCENT PACED: 99.06 %
Zone Setting Detection Interval: 400 ms

## 2014-03-21 LAB — COMPREHENSIVE METABOLIC PANEL
ALBUMIN: 4.2 g/dL (ref 3.5–5.2)
ALT: 17 U/L (ref 0–53)
AST: 24 U/L (ref 0–37)
Alkaline Phosphatase: 77 U/L (ref 39–117)
BUN: 25 mg/dL — ABNORMAL HIGH (ref 6–23)
CO2: 23 mEq/L (ref 19–32)
CREATININE: 1.71 mg/dL — AB (ref 0.50–1.35)
Calcium: 9.2 mg/dL (ref 8.4–10.5)
Chloride: 107 mEq/L (ref 96–112)
Glucose, Bld: 94 mg/dL (ref 70–99)
Potassium: 4.2 mEq/L (ref 3.5–5.3)
SODIUM: 142 meq/L (ref 135–145)
Total Bilirubin: 1.1 mg/dL (ref 0.2–1.2)
Total Protein: 6.7 g/dL (ref 6.0–8.3)

## 2014-03-21 LAB — TSH: TSH: 0.568 u[IU]/mL (ref 0.350–4.500)

## 2014-03-21 NOTE — Patient Instructions (Addendum)
Your physician recommends that you schedule a follow-up appointment in: 3 months with Dr.Croitoru  Your physician recommends that you return for lab work when possible. (CMP/TSH)

## 2014-03-21 NOTE — Progress Notes (Signed)
Patient ID: Reginald [name], male   DOB: 01/23/1924, 81 y.o.   MRN: [id number]     Cardiology Office Note   Date:  03/22/2014   ID:  Reginald [name], DOB January 18, 1924, MRN [id number]  PCP:  Neale Burly, MD  Cardiologist:   Sanda Klein, MD   Chief Complaint  Patient presents with  . Follow-up    Over the past 2-3 weeks has a quick fleeting sensation at base of throat lasting a few seconds - seen by his PCP nothing found wrong.      History of Present Illness: Reginald [name] is a 81 y.o. male who presents for pacemaker follow-up. He has had a unusual sensation that he has  difficulty describing. He feels a strange sensation at the left base of his throat that comes and goes over matter of 2-3 seconds. He denies dizziness, syncope, shortness of breath or chest pain. I'm not sure if he is describing palpitations.  He has long-standing paroxysmal atrial tachycardia/paroxysmal atrial flutter. Interrogation of his pacemaker today shows that he still has frequent episodes for overall burden of 0.6%. However all these episodes are very brief. The last episode occurred on March 10. His most recent symptoms occurred on March 16. Atrial fibrillation has not recently been seen.  His pacemaker has actually had a remarkably good record of terminating these arrhythmia with antitachycardia pacing. 90% of the episodes are immediately terminated with burst overdrive pacing. The typical arrhythmia has an atrial rates of around 180 bpm with 2-1 AV conduction.  He has roughly 96% atrial paced ventricular sensed rhythm. Ventricular pacing only occurs about 3% of the time. Lead parameters are normal. Interatrial longevity is estimated to be around 8.5 years. The pacemaker leads were implanted in 2001 for symptomatic sinus bradycardia, but the current generator (Medtronic advisa) was implanted in July 2015.  Reginald [name] does not have significant coronary disease and has preserved left ventricular systolic function but  has a long-standing history of recurrent paroxysmal atrial fibrillation. This is generally well kept in check with low-dose amiodarone. He had been on warfarin for years, but this was discontinued secondary to subdural hematoma. In February of this year he was diagnosed with bilateral subacute and chronic hemispheric subdural hematomas. He does not have a history of stroke TIA or other embolic events. Reginald [name] has evidence of slowly progressive dementia and lives with his daughter. He has become increasingly unsteady on his feet and has had numerous falls. He is extremely hard of hearing. He takes Midodrine for symptomatic orthostatic hypotension.    Past Medical History  Diagnosis Date  . Arrhythmia     afib  . Hyperlipidemia   . Syncope and collapse   . Vertigo   . BPH (benign prostatic hyperplasia)   . Anxiety neurosis   . Atrial fib/flutter, transient   . Coronary artery disease     MINOR  . Orthostatic hypotension   . OBS (organic brain syndrome)     Past Surgical History  Procedure Laterality Date  . Appendectomy    . Pulse generator implant    . Interrogation      atrial and ventricular  . Enrhythm generator      implant of a medtronic generator  . Colonoscopy  10/20/2011    Procedure: COLONOSCOPY;  Surgeon: Rogene Houston, MD;  [facility];  Service: Endoscopy;  Laterality: N/A;  . Craniotomy N/A 02/15/2013    Procedure: CRANIOTOMY HEMATOMA EVACUATION SUBDURAL;  Surgeon: Hosie Spangle, MD;  Location: [place] [facility];  Service: Neurosurgery;  Laterality: N/A;  . Doppler echocardiography N/A 02/01/12    LV CAVITY SIZE IS NORMAL. MILD CONCENTRIC HYPERTROPHY. EF 55-60%. PARAMETERS ARE CONSISTANT WITH ABNORMAL LV RELAXATION.(GRADE 1 DIASTOLIC DYSFUNCTION). MR. LEFT ATRIUM MODERATELY DILATED. RIGHT ATRIUM MILDLY DILATED. ATRIAL SEPTUM NORMAL. ATRIAL PACED RHYTHM WITH INTACT A-V CONDUCTION.  . Nuclear stress test N/A 03/18/09    NORMAL PATTERN OF PERFUSION IN ALL  REGIONS. LEFT VENTRICULAR SIZE IS NORMAL. NO EVID OF INDUCIBLE ISCHEMIA. EF 77%. NORMAL MYOCARDIAL PERFUSION STUDY.  . Cardiac catheterization  06/17/97    FALSE/POSITIVE TREADMILL EXERCISE TEST. MILD CAD WITH NL LV FUNCTION. MILD HYPERTENSION. FAMILY HX OF CAD, REMOTE SMOKER. PAST HX OF PAF.  Marland Kitchen Carotid duplex Bilateral 03/13/01    SM AMT OF NON HEMODYNAMICALLY SIGN PLAQUE IN THE RIGHT CAROTID BULB, <39% STENOSIS.  Marland Kitchen Pacemaker generator change N/A 07/24/2013    Procedure: PACEMAKER GENERATOR CHANGE;  Surgeon: Sanda Klein, MD;  Location: [place];  Service: Cardiovascular;  Laterality: N/A;     Current Outpatient Prescriptions  Medication Sig Dispense Refill  . amiodarone (PACERONE) 200 MG tablet Take 0.5 tablets (100 mg total) by mouth daily. 15 tablet 5  . donepezil (ARICEPT) 10 MG tablet Take 10 mg by mouth daily.     . isosorbide mononitrate (IMDUR) 30 MG 24 hr tablet Take 30 mg by mouth daily.    Marland Kitchen latanoprost (XALATAN) 0.005 % ophthalmic solution Place 1 drop into the right eye at bedtime.    Marland Kitchen levothyroxine (SYNTHROID, LEVOTHROID) 88 MCG tablet Take 1 tablet by mouth daily.    . midodrine (PROAMATINE) 5 MG tablet TAKE (1) TABLET TWICE DAILY. 60 tablet 10  . Multiple Vitamins-Minerals (MULTIVITAMIN WITH MINERALS) tablet Take 1 tablet by mouth daily.    . nitroGLYCERIN (NITROSTAT) 0.4 MG SL tablet Place 0.4 mg under the tongue every 5 (five) minutes as needed. For chest pain.    Marland Kitchen OLANZapine (ZYPREXA) 5 MG tablet Take 5 mg by mouth daily.    . Vitamin D, Cholecalciferol, 1000 UNITS TABS Take 1,000 Units by mouth daily.     No current facility-administered medications for this visit.    Allergies:   Review of patient's allergies indicates no known allergies.    Social History:  The patient  reports that he has never smoked. He has never used smokeless tobacco. He reports that he does not drink alcohol or use illicit drugs.   Family History:  The patient's family history includes  Cancer in his mother and sister; Heart disease in his father.    ROS:  Please see the history of present illness.    His memory is moderately impaired. He sleeps a lot. The patient specifically denies any chest pain at rest or with exertion, dyspnea at rest or with exertion, orthopnea, paroxysmal nocturnal dyspnea, syncope, palpitations, focal neurological deficits, intermittent claudication, lower extremity edema, unexplained weight gain, cough, hemoptysis or wheezing.  The patient also denies abdominal pain, nausea, vomiting, dysphagia, diarrhea, constipation, polyuria, polydipsia, dysuria, hematuria, frequency, urgency, abnormal bleeding or bruising, fever, chills, unexpected weight changes, mood swings, change in skin or hair texture, change in voice quality, auditory or visual problems, allergic reactions or rashes, new musculoskeletal complaints other than usual "aches and pains". Otherwise, review of systems positive for none.   All other systems are reviewed and negative.    PHYSICAL EXAM: VS:  BP 104/70 mmHg  Pulse 80  Resp 20  Ht _0  (1.676 m)  Wt  165 lb 9.6 oz (75.116 kg)  BMI 26.74 kg/m2 , BMI Body mass index is 26.74 kg/(m^2).  General: Alert, oriented x3, no distress Head: no evidence of trauma, PERRL, EOMI, no exophtalmos or lid lag, no myxedema, no xanthelasma; normal ears, nose and oropharynx Neck: normal jugular venous pulsations and no hepatojugular reflux; brisk carotid pulses without delay and no carotid bruits Chest: clear to auscultation, no signs of consolidation by percussion or palpation, normal fremitus, symmetrical and full respiratory excursions Cardiovascular: normal position and quality of the apical impulse, regular rhythm, normal first and second heart sounds, no murmurs, rubs or gallops Abdomen: no tenderness or distention, no masses by palpation, no abnormal pulsatility or arterial bruits, normal bowel sounds, no hepatosplenomegaly Extremities: no  clubbing, cyanosis or edema; 2+ radial, ulnar and brachial pulses bilaterally; 2+ right femoral, posterior tibial and dorsalis pedis pulses; 2+ left femoral, posterior tibial and dorsalis pedis pulses; no subclavian or femoral bruits Neurological: grossly nonfocal Psych: euthymic mood, full affect   EKG:  EKG is ordered today. The ekg ordered today demonstrates atrial paced ventricular sensed, otherwise normal   Recent Labs: 11/05/2013: Hemoglobin 11.7*; Platelets 155 03/21/2014: ALT 17; BUN 25*; Creatinine 1.71*; Potassium 4.2; Sodium 142; TSH 0.568     Wt Readings from Last 3 Encounters:  03/21/14 165 lb 9.6 oz (75.116 kg)  10/29/13 170 lb 1.6 oz (77.157 kg)  07/24/13 150 lb (68.04 kg)       ASSESSMENT AND PLAN:  1.  Paroxysmal atrial tachycardia and paroxysmal atrial flutter - there does not appear to be a direct association between his strange complaints and the arrhythmia. Atrial fibrillation has not been detected recently, has been documented in the past. He cannot take anticoagulants. Need to check liver function tests and thyroid function studies every 6 months while on amiodarone. He is "cold all the time" but similar complaints have existed for a while and his thyroid function has always been normal.  2.  Symptomatic orthostatic hypotension - seems to be pretty well controlled with Midodrine.  3.  Cardiac pacemaker with recent generator change Normal pacemaker function. He has nearly 100% atrial pacing and very little ventricular pacing. He has had numerous episodes of atrial mode switch, but all these episodes are very brief. Most of them last for less than a minute, the longest one was less than 30 minutes in duration. The overall burden of atrial arrhythmia is low. Rate control during the episodes of atrial arrhythmia is acceptable.    Current medicines are reviewed at length with the patient today.  The patient does not have concerns regarding medicines.  The following  changes have been made:  no change  Labs/ tests ordered today include:  Orders Placed This Encounter  Procedures  . Comp Met (CMET)  . TSH  . Implantable device check  . EKG 12-Lead   Patient Instructions  Your physician recommends that you schedule a follow-up appointment in: 3 months with Dr.Ambri Miltner  Your physician recommends that you return for lab work when possible. (CMP/TSH)    Mikael Spray, MD  03/22/2014 5:50 PM    Sanda Klein, MD, [facility] [phone] office [phone] pager

## 2014-04-23 ENCOUNTER — Other Ambulatory Visit: Payer: Self-pay | Admitting: Cardiovascular Disease

## 2014-05-07 ENCOUNTER — Encounter: Payer: Self-pay | Admitting: Cardiovascular Disease

## 2014-06-26 ENCOUNTER — Ambulatory Visit (INDEPENDENT_AMBULATORY_CARE_PROVIDER_SITE_OTHER): Payer: Medicare Other | Admitting: Cardiovascular Disease

## 2014-06-26 ENCOUNTER — Encounter: Payer: Self-pay | Admitting: Cardiovascular Disease

## 2014-06-26 VITALS — BP 110/74 | HR 85 | Ht 66.0 in | Wt 161.0 lb

## 2014-06-26 DIAGNOSIS — I10 Essential (primary) hypertension: Secondary | ICD-10-CM

## 2014-06-26 DIAGNOSIS — I480 Paroxysmal atrial fibrillation: Secondary | ICD-10-CM

## 2014-06-26 LAB — CUP PACEART INCLINIC DEVICE CHECK
Battery Remaining Longevity: 95 mo
Battery Voltage: 3.01 V
Brady Statistic RA Percent Paced: 99.24 %
Brady Statistic RV Percent Paced: 15.46 %
Lead Channel Impedance Value: 266 Ohm
Lead Channel Impedance Value: 323 Ohm
Lead Channel Impedance Value: 494 Ohm
Lead Channel Pacing Threshold Amplitude: 0.5 V
Lead Channel Pacing Threshold Pulse Width: 0.4 ms
Lead Channel Sensing Intrinsic Amplitude: 3.75 mV
Lead Channel Setting Pacing Amplitude: 2 V
MDC IDC MSMT LEADCHNL RA PACING THRESHOLD PULSEWIDTH: 0.4 ms
MDC IDC MSMT LEADCHNL RA SENSING INTR AMPL: 3.125 mV
MDC IDC MSMT LEADCHNL RA SENSING INTR AMPL: 3.125 mV
MDC IDC MSMT LEADCHNL RV IMPEDANCE VALUE: 513 Ohm
MDC IDC MSMT LEADCHNL RV PACING THRESHOLD AMPLITUDE: 0.75 V
MDC IDC MSMT LEADCHNL RV SENSING INTR AMPL: 3.75 mV
MDC IDC SESS DTM: 20160622190418
MDC IDC SET LEADCHNL RV PACING AMPLITUDE: 2 V
MDC IDC SET LEADCHNL RV PACING PULSEWIDTH: 0.4 ms
MDC IDC SET LEADCHNL RV SENSING SENSITIVITY: 2 mV
MDC IDC SET ZONE DETECTION INTERVAL: 400 ms
MDC IDC STAT BRADY AP VP PERCENT: 15.06 %
MDC IDC STAT BRADY AP VS PERCENT: 84.18 %
MDC IDC STAT BRADY AS VP PERCENT: 0.4 %
MDC IDC STAT BRADY AS VS PERCENT: 0.36 %
Zone Setting Detection Interval: 400 ms

## 2014-06-26 MED ORDER — AMIODARONE HCL 200 MG PO TABS: 200.0000 mg | ORAL_TABLET | Freq: Every day | ORAL | Status: DC

## 2014-06-26 NOTE — Patient Instructions (Signed)
Medication Instructions:   INCREASE AMIODARONE TO 200MG  DAILY  Labwork:  NONE  Testing/Procedures:  NONE  Follow-Up:  Remote monitoring is used to monitor your Pacemaker or ICD from home. This monitoring reduces the number of office visits required to check your device to one time per year. It allows [place] to monitor the functioning of your device to ensure it is working properly. You are scheduled for a device check from home on September 27, 2014. You may send your transmission at any time that day. If you have a wireless device, the transmission will be sent automatically. After your physician reviews your transmission, you will receive a postcard with your next transmission date.  Dr. Sallyanne Kuster recommends that you schedule a follow-up appointment in: 6 MONTHS

## 2014-06-27 NOTE — Progress Notes (Signed)
Patient ID: Reginald [name], male   DOB: 1924-12-24, 81 y.o.   MRN: [id number]     Cardiology Office Note   Date:  06/28/2014   ID:  Reginald [name], DOB 07-Jun-1924, MRN [id number]  PCP:  Neale Burly, MD  Cardiologist:   Sanda Klein, MD   Chief Complaint  Patient presents with  . Follow-up    Patient has had some chest discomfort.      History of Present Illness: Reginald [name] is a 81 y.o. male who presents for symptomatic sinus bradycardia and pacemaker follow-up , paroxysmal atrial fibrillation , history of recurrent subdural hematoma and dementia.  He has been steadily declining with lower lower activity level  Over the last 10 months. He denies chest pain and dyspnea. He has no desire to leave the house or do any physical activity.  His appetite is mediocre and he has been losing weight  He denies headaches or new focal neurological problems. As far as his daughter knows he has not had any new serious falls or head injuries.  He will occasionally stop dead in his tracks and look distressed. This appears to happen randomly and unpredictably and never lasts more than a minute.  He cannot really express what's wrong.   Interrogation of his pacemaker shows normal device function. His Medtronic Advisa dual chamber device was implanted in 2015 and still has roughly 7.5 years of generator longevity.  There is  Virtually 100% atrial pacing as well as 15.5% ventricular pacing. The only times when he does not have atrial pacing is when he has an episode of atrial tachycardia or atrial fibrillation. The overall burden of these arrhythmia events is 0.4% with the longest lasting for about 42 minutes. Invariably he has good ventricular rate control. Arrhythmia therapies are turned on and overdrive pacing has been reportedly successful in 53 of 73 events, but review of the electrogram suggests that these are all "dirty breaks" with the arrhythmia terminating several seconds after antitachycardia  pacing is delivered.  Reginald [name] does not have significant coronary disease and has preserved left ventricular systolic function but has a long-standing history of recurrent paroxysmal atrial fibrillation. This is generally well kept in check with low-dose amiodarone. He had been on warfarin for years, but this was discontinued secondary to subdural hematoma. In February of this year he was diagnosed with bilateral subacute and chronic hemispheric subdural hematomas. He does not have a history of stroke TIA or other embolic events. Reginald [name] has evidence of slowly progressive dementia and lives with his daughter. He has become increasingly unsteady on his feet and has had numerous falls. He is extremely hard of hearing. He takes Midodrine for symptomatic orthostatic hypotension.    Past Medical History  Diagnosis Date  . Arrhythmia     afib  . Hyperlipidemia   . Syncope and collapse   . Vertigo   . BPH (benign prostatic hyperplasia)   . Anxiety neurosis   . Atrial fib/flutter, transient   . Coronary artery disease     MINOR  . Orthostatic hypotension   . OBS (organic brain syndrome)     Past Surgical History  Procedure Laterality Date  . Appendectomy    . Pulse generator implant    . Interrogation      atrial and ventricular  . Enrhythm generator      implant of a medtronic generator  . Colonoscopy  10/20/2011    Procedure: COLONOSCOPY;  Surgeon: Rogene Houston, MD;  [facility];  Service: Endoscopy;  Laterality: N/A;  . Craniotomy N/A 02/15/2013    Procedure: CRANIOTOMY HEMATOMA EVACUATION SUBDURAL;  Surgeon: Hosie Spangle, MD;  Location: [place] [facility];  Service: Neurosurgery;  Laterality: N/A;  . Doppler echocardiography N/A 02/01/12    LV CAVITY SIZE IS NORMAL. MILD CONCENTRIC HYPERTROPHY. EF 55-60%. PARAMETERS ARE CONSISTANT WITH ABNORMAL LV RELAXATION.(GRADE 1 DIASTOLIC DYSFUNCTION). MR. LEFT ATRIUM MODERATELY DILATED. RIGHT ATRIUM MILDLY DILATED. ATRIAL SEPTUM  NORMAL. ATRIAL PACED RHYTHM WITH INTACT A-V CONDUCTION.  . Nuclear stress test N/A 03/18/09    NORMAL PATTERN OF PERFUSION IN ALL REGIONS. LEFT VENTRICULAR SIZE IS NORMAL. NO EVID OF INDUCIBLE ISCHEMIA. EF 77%. NORMAL MYOCARDIAL PERFUSION STUDY.  . Cardiac catheterization  06/17/97    FALSE/POSITIVE TREADMILL EXERCISE TEST. MILD CAD WITH NL LV FUNCTION. MILD HYPERTENSION. FAMILY HX OF CAD, REMOTE SMOKER. PAST HX OF PAF.  Marland Kitchen Carotid duplex Bilateral 03/13/01    SM AMT OF NON HEMODYNAMICALLY SIGN PLAQUE IN THE RIGHT CAROTID BULB, <39% STENOSIS.  Marland Kitchen Pacemaker generator change N/A 07/24/2013    Procedure: PACEMAKER GENERATOR CHANGE;  Surgeon: Sanda Klein, MD;  Location: [place] [facility];  Service: Cardiovascular;  Laterality: N/A;     Current Outpatient Prescriptions  Medication Sig Dispense Refill  . amiodarone (PACERONE) 200 MG tablet Take 1 tablet (200 mg total) by mouth daily. 30 tablet 6  . donepezil (ARICEPT) 10 MG tablet Take 10 mg by mouth daily.     . isosorbide mononitrate (IMDUR) 30 MG 24 hr tablet Take 30 mg by mouth daily.    Marland Kitchen latanoprost (XALATAN) 0.005 % ophthalmic solution Place 1 drop into the right eye at bedtime.    Marland Kitchen levothyroxine (SYNTHROID, LEVOTHROID) 88 MCG tablet Take 1 tablet by mouth daily.    . Melatonin 10 MG TABS Take 10 mg by mouth at bedtime.    . midodrine (PROAMATINE) 5 MG tablet TAKE (1) TABLET TWICE DAILY. 60 tablet 10  . Multiple Vitamins-Minerals (MULTIVITAMIN WITH MINERALS) tablet Take 1 tablet by mouth daily.    . nitroGLYCERIN (NITROSTAT) 0.4 MG SL tablet Place 0.4 mg under the tongue every 5 (five) minutes as needed. For chest pain.    Marland Kitchen OLANZapine (ZYPREXA) 5 MG tablet Take 5 mg by mouth daily.    . Vitamin D, Cholecalciferol, 1000 UNITS TABS Take 1,000 Units by mouth daily.     No current facility-administered medications for this visit.    Allergies:   Review of patient's allergies indicates no known allergies.    Social History:  The patient  reports  that he has never smoked. He has never used smokeless tobacco. He reports that he does not drink alcohol or use illicit drugs.   Family History:  The patient's family history includes Cancer in his mother and sister; Heart disease in his father.    ROS:  Please see the history of present illness.    Otherwise, review of systems positive for none.   All other systems are reviewed and negative.    PHYSICAL EXAM: VS:  BP 110/74 mmHg  Pulse 85  Ht 5\' 6"  (1.676 m)  Wt 161 lb (73.029 kg)  BMI 26.00 kg/m2 , BMI Body mass index is 26 kg/(m^2).  General: Alert, oriented x3, no distress Head: no evidence of trauma, PERRL, EOMI, no exophtalmos or lid lag, no myxedema, no xanthelasma; normal ears, nose and oropharynx Neck: normal jugular venous pulsations and no hepatojugular reflux; brisk carotid pulses without delay and no carotid bruits Chest:  clear to auscultation, no signs of consolidation by percussion or palpation, normal fremitus, symmetrical and full respiratory excursions Cardiovascular: normal position and quality of the apical impulse, regular rhythm, normal first and second heart sounds, no murmurs, rubs or gallops Abdomen: no tenderness or distention, no masses by palpation, no abnormal pulsatility or arterial bruits, normal bowel sounds, no hepatosplenomegaly Extremities: no clubbing, cyanosis or edema; 2+ radial, ulnar and brachial pulses bilaterally; 2+ right femoral, posterior tibial and dorsalis pedis pulses; 2+ left femoral, posterior tibial and dorsalis pedis pulses; no subclavian or femoral bruits Neurological: grossly nonfocal Psych: euthymic mood, full affect   EKG:  EKG is ordered today. The ekg ordered today demonstrates  Atrial paced, ventricular sensed, one ventricular paced beat after a post PA-C Pauls , nonspecific T-wave flattening in the precordial leads.   Recent Labs: 11/05/2013: Hemoglobin 11.7*; Platelets 155 03/21/2014: ALT 17; BUN 25*; Creat 1.71*; Potassium  4.2; Sodium 142; TSH 0.568    Lipid Panel No results found for: CHOL, TRIG, HDL, CHOLHDL, VLDL, LDLCALC, LDLDIRECT    Wt Readings from Last 3 Encounters:  06/26/14 161 lb (73.029 kg)  03/21/14 165 lb 9.6 oz (75.116 kg)  10/29/13 170 lb 1.6 oz (77.157 kg)     ASSESSMENT AND PLAN:  1. Paroxysmal atrial tachycardia and paroxysmal atrial flutter - in the past, there did not appear to be a direct association between his strange complaints and the arrhythmia.  His daughter has not been keeping a detailed log of the time and dates of his events anymore. Atrial fibrillation burden is low and rate control is appropriate.  We'll try to increase the amiodarone dose to see if the release to a reduction in his "spells".He cannot take anticoagulants. Need to check liver function tests and thyroid function studies every 6 months while on amiodarone.  Avoid sun exposure. Yearly ophthalmological exams.  2. Symptomatic orthostatic hypotension - seems to be pretty well controlled with Midodrine.  3. Cardiac pacemaker with normal function Normal pacemaker function. He has nearly 100% atrial pacing and very little ventricular pacing.     I'm not sure whether his steady decline is a sign of progressive dementia or possible recurrent subdural hematoma. They might present in very similar fashion. I have advised his daughter to discuss this possibility with his neurologist or PCP.    Current medicines are reviewed at length with the patient today.  The patient does not have concerns regarding medicines.  The following changes have been made:   Increase amiodarone to 200 mg once daily  Labs/ tests ordered today include:  Orders Placed This Encounter  Procedures  . EKG 12-Lead   Patient Instructions  Medication Instructions:   INCREASE AMIODARONE TO 200MG  DAILY  Labwork:  NONE  Testing/Procedures:  NONE  Follow-Up:  Remote monitoring is used to monitor your Pacemaker or ICD from home. This  monitoring reduces the number of office visits required to check your device to one time per year. It allows [place] to monitor the functioning of your device to ensure it is working properly. You are scheduled for a device check from home on September 27, 2014. You may send your transmission at any time that day. If you have a wireless device, the transmission will be sent automatically. After your physician reviews your transmission, you will receive a postcard with your next transmission date.  Dr. Sallyanne Kuster recommends that you schedule a follow-up appointment in: 6 MONTHS            Signed,  Sanda Klein, MD  06/28/2014 6:19 PM    Sanda Klein, MD, [facility] [phone] office [phone] pager

## 2014-09-30 ENCOUNTER — Telehealth: Payer: Self-pay | Admitting: Cardiology

## 2014-09-30 ENCOUNTER — Encounter: Payer: Medicare Other | Admitting: *Deleted

## 2014-09-30 NOTE — Telephone Encounter (Signed)
Attempted to confirm remote transmission with pt. No answer and was unable to leave a message.   

## 2014-10-01 ENCOUNTER — Encounter: Payer: Self-pay | Admitting: Cardiology

## 2014-10-03 ENCOUNTER — Other Ambulatory Visit: Payer: Self-pay | Admitting: Cardiovascular Disease

## 2014-10-03 NOTE — Telephone Encounter (Signed)
REFILL 

## 2014-10-04 ENCOUNTER — Ambulatory Visit (INDEPENDENT_AMBULATORY_CARE_PROVIDER_SITE_OTHER): Payer: Medicare Other | Admitting: Urology

## 2014-10-04 DIAGNOSIS — R351 Nocturia: Secondary | ICD-10-CM | POA: Diagnosis not present

## 2014-10-04 DIAGNOSIS — N3281 Overactive bladder: Secondary | ICD-10-CM

## 2014-10-04 DIAGNOSIS — R3915 Urgency of urination: Secondary | ICD-10-CM

## 2014-10-07 ENCOUNTER — Encounter: Payer: Self-pay | Admitting: Cardiovascular Disease

## 2014-10-07 ENCOUNTER — Ambulatory Visit (INDEPENDENT_AMBULATORY_CARE_PROVIDER_SITE_OTHER): Payer: Medicare Other | Admitting: *Deleted

## 2014-10-07 DIAGNOSIS — Z950 Presence of cardiac pacemaker: Secondary | ICD-10-CM

## 2014-10-07 DIAGNOSIS — I495 Sick sinus syndrome: Secondary | ICD-10-CM

## 2014-10-08 NOTE — Progress Notes (Signed)
Remote pacemaker transmission.   

## 2014-10-10 LAB — CUP PACEART REMOTE DEVICE CHECK
Battery Remaining Longevity: 86 mo
Brady Statistic AP VP Percent: 33.53 %
Brady Statistic AP VS Percent: 65.94 %
Brady Statistic AS VP Percent: 0.42 %
Brady Statistic AS VS Percent: 0.1 %
Brady Statistic RA Percent Paced: 99.48 %
Brady Statistic RV Percent Paced: 33.95 %
Lead Channel Impedance Value: 266 Ohm
Lead Channel Impedance Value: 342 Ohm
Lead Channel Impedance Value: 494 Ohm
Lead Channel Impedance Value: 513 Ohm
Lead Channel Pacing Threshold Amplitude: 0.625 V
Lead Channel Pacing Threshold Pulse Width: 0.4 ms
Lead Channel Sensing Intrinsic Amplitude: 2.625 mV
Lead Channel Sensing Intrinsic Amplitude: 2.625 mV
Lead Channel Sensing Intrinsic Amplitude: 3.625 mV
Lead Channel Sensing Intrinsic Amplitude: 3.625 mV
Lead Channel Setting Pacing Amplitude: 2 V
Lead Channel Setting Pacing Pulse Width: 0.4 ms
MDC IDC MSMT BATTERY VOLTAGE: 3.01 V
MDC IDC MSMT LEADCHNL RA PACING THRESHOLD AMPLITUDE: 0.5 V
MDC IDC MSMT LEADCHNL RV PACING THRESHOLD PULSEWIDTH: 0.4 ms
MDC IDC SESS DTM: 20161003220504
MDC IDC SET LEADCHNL RV PACING AMPLITUDE: 2 V
MDC IDC SET LEADCHNL RV SENSING SENSITIVITY: 2 mV
Zone Setting Detection Interval: 400 ms
Zone Setting Detection Interval: 400 ms

## 2014-10-17 ENCOUNTER — Encounter: Payer: Self-pay | Admitting: Cardiology

## 2014-11-08 ENCOUNTER — Ambulatory Visit: Payer: Medicare Other | Admitting: Urology

## 2014-12-04 ENCOUNTER — Telehealth: Payer: Self-pay | Admitting: Cardiovascular Disease

## 2014-12-04 NOTE — Telephone Encounter (Signed)
LMOVM informing pt that the life alert should be fine as long as it is at least a foot a way from pacemaker, weather it be hooked to his belt loop, or a long lanyard. If she had any additional questions she could call back.

## 2014-12-04 NOTE — Telephone Encounter (Signed)
She wants to know if he can use The Life Alert with his pacemaker? This one runs off of cell line towers.If not there,please leave a message.

## 2014-12-31 ENCOUNTER — Encounter: Payer: Self-pay | Admitting: *Deleted

## 2015-01-12 DIAGNOSIS — R269 Unspecified abnormalities of gait and mobility: Secondary | ICD-10-CM | POA: Diagnosis not present

## 2015-01-24 DIAGNOSIS — M25552 Pain in left hip: Secondary | ICD-10-CM | POA: Diagnosis not present

## 2015-01-24 DIAGNOSIS — M4120 Other idiopathic scoliosis, site unspecified: Secondary | ICD-10-CM | POA: Diagnosis not present

## 2015-01-27 ENCOUNTER — Telehealth: Payer: Self-pay | Admitting: Cardiovascular Disease

## 2015-01-27 MED ORDER — MIDODRINE HCL 5 MG PO TABS: 5.0000 mg | ORAL_TABLET | Freq: Two times a day (BID) | ORAL | Status: DC

## 2015-01-27 NOTE — Telephone Encounter (Signed)
Refill sent to the pharmacy electronically.  

## 2015-01-27 NOTE — Telephone Encounter (Signed)
°*  STAT* If patient is at the pharmacy, call can be transferred to refill team.   1. Which medications need to be refilled? (please list name of each medication and dose if known) Midodrine 5 mg  2. Which pharmacy/location (including street and city if local pharmacy) is medication to be sent to? [place]   3. Do they need a 30 day or 90 day supply? [place]

## 2015-01-29 ENCOUNTER — Other Ambulatory Visit: Payer: Self-pay | Admitting: Cardiovascular Disease

## 2015-02-10 DIAGNOSIS — M4120 Other idiopathic scoliosis, site unspecified: Secondary | ICD-10-CM | POA: Diagnosis not present

## 2015-02-11 DIAGNOSIS — H353131 Nonexudative age-related macular degeneration, bilateral, early dry stage: Secondary | ICD-10-CM | POA: Diagnosis not present

## 2015-02-11 DIAGNOSIS — H35372 Puckering of macula, left eye: Secondary | ICD-10-CM | POA: Diagnosis not present

## 2015-02-11 DIAGNOSIS — H348312 Tributary (branch) retinal vein occlusion, right eye, stable: Secondary | ICD-10-CM | POA: Diagnosis not present

## 2015-02-11 DIAGNOSIS — H35363 Drusen (degenerative) of macula, bilateral: Secondary | ICD-10-CM | POA: Diagnosis not present

## 2015-02-12 DIAGNOSIS — R269 Unspecified abnormalities of gait and mobility: Secondary | ICD-10-CM | POA: Diagnosis not present

## 2015-02-14 DIAGNOSIS — Z6824 Body mass index (BMI) 24.0-24.9, adult: Secondary | ICD-10-CM | POA: Diagnosis not present

## 2015-02-14 DIAGNOSIS — Z0000 Encounter for general adult medical examination without abnormal findings: Secondary | ICD-10-CM | POA: Diagnosis not present

## 2015-02-14 DIAGNOSIS — J208 Acute bronchitis due to other specified organisms: Secondary | ICD-10-CM | POA: Diagnosis not present

## 2015-02-15 DIAGNOSIS — R402421 Glasgow coma scale score 9-12, in the field [EMT or ambulance]: Secondary | ICD-10-CM | POA: Diagnosis not present

## 2015-02-15 DIAGNOSIS — K5909 Other constipation: Secondary | ICD-10-CM | POA: Diagnosis not present

## 2015-02-15 DIAGNOSIS — N390 Urinary tract infection, site not specified: Secondary | ICD-10-CM | POA: Diagnosis not present

## 2015-02-15 DIAGNOSIS — E039 Hypothyroidism, unspecified: Secondary | ICD-10-CM | POA: Diagnosis not present

## 2015-02-15 DIAGNOSIS — Z803 Family history of malignant neoplasm of breast: Secondary | ICD-10-CM | POA: Diagnosis not present

## 2015-02-15 DIAGNOSIS — R627 Adult failure to thrive: Secondary | ICD-10-CM | POA: Diagnosis not present

## 2015-02-15 DIAGNOSIS — G908 Other disorders of autonomic nervous system: Secondary | ICD-10-CM | POA: Diagnosis not present

## 2015-02-15 DIAGNOSIS — I482 Chronic atrial fibrillation: Secondary | ICD-10-CM | POA: Diagnosis not present

## 2015-02-15 DIAGNOSIS — Z87891 Personal history of nicotine dependence: Secondary | ICD-10-CM | POA: Diagnosis not present

## 2015-02-15 DIAGNOSIS — M6281 Muscle weakness (generalized): Secondary | ICD-10-CM | POA: Diagnosis not present

## 2015-02-15 DIAGNOSIS — N179 Acute kidney failure, unspecified: Secondary | ICD-10-CM | POA: Diagnosis not present

## 2015-02-15 DIAGNOSIS — R2689 Other abnormalities of gait and mobility: Secondary | ICD-10-CM | POA: Diagnosis not present

## 2015-02-15 DIAGNOSIS — J1100 Influenza due to unidentified influenza virus with unspecified type of pneumonia: Secondary | ICD-10-CM | POA: Diagnosis not present

## 2015-02-15 DIAGNOSIS — Z23 Encounter for immunization: Secondary | ICD-10-CM | POA: Diagnosis not present

## 2015-02-15 DIAGNOSIS — E7800 Pure hypercholesterolemia, unspecified: Secondary | ICD-10-CM | POA: Diagnosis not present

## 2015-02-15 DIAGNOSIS — E785 Hyperlipidemia, unspecified: Secondary | ICD-10-CM | POA: Diagnosis not present

## 2015-02-15 DIAGNOSIS — E559 Vitamin D deficiency, unspecified: Secondary | ICD-10-CM | POA: Diagnosis not present

## 2015-02-15 DIAGNOSIS — I10 Essential (primary) hypertension: Secondary | ICD-10-CM | POA: Diagnosis not present

## 2015-02-15 DIAGNOSIS — Z79899 Other long term (current) drug therapy: Secondary | ICD-10-CM | POA: Diagnosis not present

## 2015-02-15 DIAGNOSIS — Z950 Presence of cardiac pacemaker: Secondary | ICD-10-CM | POA: Diagnosis not present

## 2015-02-15 DIAGNOSIS — I209 Angina pectoris, unspecified: Secondary | ICD-10-CM | POA: Diagnosis not present

## 2015-02-15 DIAGNOSIS — G309 Alzheimer's disease, unspecified: Secondary | ICD-10-CM | POA: Diagnosis not present

## 2015-02-15 DIAGNOSIS — R404 Transient alteration of awareness: Secondary | ICD-10-CM | POA: Diagnosis not present

## 2015-02-15 DIAGNOSIS — N178 Other acute kidney failure: Secondary | ICD-10-CM | POA: Diagnosis not present

## 2015-02-15 DIAGNOSIS — F0390 Unspecified dementia without behavioral disturbance: Secondary | ICD-10-CM | POA: Diagnosis not present

## 2015-02-15 DIAGNOSIS — R4182 Altered mental status, unspecified: Secondary | ICD-10-CM | POA: Diagnosis not present

## 2015-02-15 DIAGNOSIS — H409 Unspecified glaucoma: Secondary | ICD-10-CM | POA: Diagnosis not present

## 2015-02-15 DIAGNOSIS — Z8489 Family history of other specified conditions: Secondary | ICD-10-CM | POA: Diagnosis not present

## 2015-02-15 DIAGNOSIS — J1108 Influenza due to unidentified influenza virus with specified pneumonia: Secondary | ICD-10-CM | POA: Diagnosis not present

## 2015-02-15 DIAGNOSIS — R509 Fever, unspecified: Secondary | ICD-10-CM | POA: Diagnosis not present

## 2015-02-15 DIAGNOSIS — K5900 Constipation, unspecified: Secondary | ICD-10-CM | POA: Diagnosis not present

## 2015-02-15 DIAGNOSIS — J189 Pneumonia, unspecified organism: Secondary | ICD-10-CM | POA: Diagnosis not present

## 2015-02-15 DIAGNOSIS — Z7401 Bed confinement status: Secondary | ICD-10-CM | POA: Diagnosis not present

## 2015-02-15 DIAGNOSIS — Z9981 Dependence on supplemental oxygen: Secondary | ICD-10-CM | POA: Diagnosis not present

## 2015-02-19 DIAGNOSIS — Z9981 Dependence on supplemental oxygen: Secondary | ICD-10-CM | POA: Diagnosis not present

## 2015-02-19 DIAGNOSIS — R627 Adult failure to thrive: Secondary | ICD-10-CM | POA: Diagnosis not present

## 2015-02-19 DIAGNOSIS — R2689 Other abnormalities of gait and mobility: Secondary | ICD-10-CM | POA: Diagnosis not present

## 2015-02-19 DIAGNOSIS — I209 Angina pectoris, unspecified: Secondary | ICD-10-CM | POA: Diagnosis not present

## 2015-02-19 DIAGNOSIS — E785 Hyperlipidemia, unspecified: Secondary | ICD-10-CM | POA: Diagnosis not present

## 2015-02-19 DIAGNOSIS — N179 Acute kidney failure, unspecified: Secondary | ICD-10-CM | POA: Diagnosis not present

## 2015-02-19 DIAGNOSIS — N390 Urinary tract infection, site not specified: Secondary | ICD-10-CM | POA: Diagnosis not present

## 2015-02-19 DIAGNOSIS — I480 Paroxysmal atrial fibrillation: Secondary | ICD-10-CM | POA: Diagnosis not present

## 2015-02-19 DIAGNOSIS — Z7401 Bed confinement status: Secondary | ICD-10-CM | POA: Diagnosis not present

## 2015-02-19 DIAGNOSIS — J1108 Influenza due to unidentified influenza virus with specified pneumonia: Secondary | ICD-10-CM | POA: Diagnosis not present

## 2015-02-19 DIAGNOSIS — Z950 Presence of cardiac pacemaker: Secondary | ICD-10-CM | POA: Diagnosis not present

## 2015-02-19 DIAGNOSIS — R001 Bradycardia, unspecified: Secondary | ICD-10-CM | POA: Diagnosis not present

## 2015-02-19 DIAGNOSIS — G309 Alzheimer's disease, unspecified: Secondary | ICD-10-CM | POA: Diagnosis not present

## 2015-02-19 DIAGNOSIS — Z23 Encounter for immunization: Secondary | ICD-10-CM | POA: Diagnosis not present

## 2015-02-19 DIAGNOSIS — I482 Chronic atrial fibrillation: Secondary | ICD-10-CM | POA: Diagnosis not present

## 2015-02-19 DIAGNOSIS — M6281 Muscle weakness (generalized): Secondary | ICD-10-CM | POA: Diagnosis not present

## 2015-02-19 DIAGNOSIS — J189 Pneumonia, unspecified organism: Secondary | ICD-10-CM | POA: Diagnosis not present

## 2015-02-19 DIAGNOSIS — E039 Hypothyroidism, unspecified: Secondary | ICD-10-CM | POA: Diagnosis not present

## 2015-02-19 DIAGNOSIS — N178 Other acute kidney failure: Secondary | ICD-10-CM | POA: Diagnosis not present

## 2015-02-19 DIAGNOSIS — G908 Other disorders of autonomic nervous system: Secondary | ICD-10-CM | POA: Diagnosis not present

## 2015-02-19 DIAGNOSIS — I10 Essential (primary) hypertension: Secondary | ICD-10-CM | POA: Diagnosis not present

## 2015-02-19 DIAGNOSIS — K5900 Constipation, unspecified: Secondary | ICD-10-CM | POA: Diagnosis not present

## 2015-02-19 DIAGNOSIS — I951 Orthostatic hypotension: Secondary | ICD-10-CM | POA: Diagnosis not present

## 2015-02-19 DIAGNOSIS — R404 Transient alteration of awareness: Secondary | ICD-10-CM | POA: Diagnosis not present

## 2015-03-04 ENCOUNTER — Encounter: Payer: Self-pay | Admitting: Cardiovascular Disease

## 2015-03-04 ENCOUNTER — Ambulatory Visit (INDEPENDENT_AMBULATORY_CARE_PROVIDER_SITE_OTHER): Payer: Medicare Other | Admitting: Cardiovascular Disease

## 2015-03-04 VITALS — BP 106/72 | HR 80 | Ht 66.0 in | Wt 163.0 lb

## 2015-03-04 DIAGNOSIS — S065XAA Traumatic subdural hemorrhage with loss of consciousness status unknown, initial encounter: Secondary | ICD-10-CM

## 2015-03-04 DIAGNOSIS — I951 Orthostatic hypotension: Secondary | ICD-10-CM

## 2015-03-04 DIAGNOSIS — I480 Paroxysmal atrial fibrillation: Secondary | ICD-10-CM

## 2015-03-04 DIAGNOSIS — I6200 Nontraumatic subdural hemorrhage, unspecified: Secondary | ICD-10-CM

## 2015-03-04 DIAGNOSIS — R296 Repeated falls: Secondary | ICD-10-CM

## 2015-03-04 DIAGNOSIS — R001 Bradycardia, unspecified: Secondary | ICD-10-CM

## 2015-03-04 DIAGNOSIS — S065X9A Traumatic subdural hemorrhage with loss of consciousness of unspecified duration, initial encounter: Secondary | ICD-10-CM

## 2015-03-04 DIAGNOSIS — Z950 Presence of cardiac pacemaker: Secondary | ICD-10-CM | POA: Diagnosis not present

## 2015-03-04 MED ORDER — ASPIRIN EC 81 MG PO TBEC: 81.0000 mg | DELAYED_RELEASE_TABLET | Freq: Every day | ORAL | Status: DC

## 2015-03-04 NOTE — Patient Instructions (Signed)
Your physician has recommended you make the following change in your medication:   STOP ISOSORBIDE  IF YOU ARE NOT ALREADY TAKING ASPIRIN 81 MG DAILY PLEASE START TAKING.  Remote monitoring is used to monitor your Pacemaker from home. This monitoring reduces the number of office visits required to check your device to one time per year. It allows [place] to monitor the functioning of your device to ensure it is working properly. You are scheduled for a device check from home on Jun 02, 2015. You may send your transmission at any time that day. If you have a wireless device, the transmission will be sent automatically. After your physician reviews your transmission, you will receive a postcard with your next transmission date.  Dr. Sallyanne Kuster recommends that you schedule a follow-up appointment in: [place] ([place])

## 2015-03-04 NOTE — Progress Notes (Signed)
Patient ID: Reginald [name], male   DOB: 1924/05/15, 82 y.o.   MRN: [id number]    Cardiology Office Note    Date:  03/04/2015   ID:  Reginald [name], DOB [birth year], MRN [id number]  PCP:  Neale Burly, MD  Cardiologist:   Sanda Klein, MD   Chief Complaint  Patient presents with  . Follow-up    PACER CHECK    History of Present Illness:  Reginald [name] is a 82 y.o. male who presents for symptomatic sinus bradycardia and pacemaker follow-up , paroxysmal atrial fibrillation , history of recurrent subdural hematoma and dementia.  He is in the nursing home for a couple of weeks. He had pneumonia and UTI, substantially limiting physical activity. This is reflected in his pacemaker interrogation with gradual decline over the last year and big dips in activity in October and early February. Short term and even mid term memory have worsened. He remains unsteady and is in a wheelchair. He has fallen twice recently.   Interrogation of his pacemaker shows normal device function. His Medtronic Advisa dual chamber device was implanted in 2015 and still has roughly 6.5 years of generator longevity. There isalmost 99% atrial pacing as well as 44% ventricular pacing.  The overall burden of atrial tachycardia or atrial fibrillation is 0.9% with the longest lasting for about 25 minutes. Invariably he has good ventricular rate control. Arrhythmia therapies are turned on and overdrive pacing has been reportedly successful in 60% of these events. At least a few of these are "clean" breaks with ATP.  Reginald [name] does not have significant coronary disease and has preserved left ventricular systolic function but has a long-standing history of recurrent paroxysmal atrial fibrillation. After taking warfarin for years, this was discontinued in February of 2016 for bilateral subacute and chronic hemispheric subdural hematomas. He does not have a history of stroke/TIA or other embolic events. Reginald [name] has evidence of slowly  progressive dementia and lives with his daughter. He is extremely hard of hearing. He takes Midodrine for symptomatic orthostatic hypotension.   Past Medical History  Diagnosis Date  . Arrhythmia     afib  . Hyperlipidemia   . Syncope and collapse   . Vertigo   . BPH (benign prostatic hyperplasia)   . Anxiety neurosis   . Atrial fib/flutter, transient   . Coronary artery disease     MINOR  . Orthostatic hypotension   . OBS (organic brain syndrome)     Past Surgical History  Procedure Laterality Date  . Appendectomy    . Pulse generator implant    . Interrogation      atrial and ventricular  . Enrhythm generator      implant of a medtronic generator  . Colonoscopy  10/20/2011    Procedure: COLONOSCOPY;  Surgeon: Rogene Houston, MD;  [facility];  Service: Endoscopy;  Laterality: N/A;  . Craniotomy N/A 02/15/2013    Procedure: CRANIOTOMY HEMATOMA EVACUATION SUBDURAL;  Surgeon: Hosie Spangle, MD;  Location: [facility];  Service: Neurosurgery;  Laterality: N/A;  . Doppler echocardiography N/A 02/01/12    LV CAVITY SIZE IS NORMAL. MILD CONCENTRIC HYPERTROPHY. EF 55-60%. PARAMETERS ARE CONSISTANT WITH ABNORMAL LV RELAXATION.(GRADE 1 DIASTOLIC DYSFUNCTION). MR. LEFT ATRIUM MODERATELY DILATED. RIGHT ATRIUM MILDLY DILATED. ATRIAL SEPTUM NORMAL. ATRIAL PACED RHYTHM WITH INTACT A-V CONDUCTION.  . Nuclear stress test N/A 03/18/09    NORMAL PATTERN OF PERFUSION IN ALL REGIONS. LEFT VENTRICULAR SIZE IS NORMAL. NO EVID OF INDUCIBLE  ISCHEMIA. EF 77%. NORMAL MYOCARDIAL PERFUSION STUDY.  . Cardiac catheterization  06/17/97    FALSE/POSITIVE TREADMILL EXERCISE TEST. MILD CAD WITH NL LV FUNCTION. MILD HYPERTENSION. FAMILY HX OF CAD, REMOTE SMOKER. PAST HX OF PAF.  Marland Kitchen Carotid duplex Bilateral 03/13/01    SM AMT OF NON HEMODYNAMICALLY SIGN PLAQUE IN THE RIGHT CAROTID BULB, <39% STENOSIS.  Marland Kitchen Pacemaker generator change N/A 07/24/2013    Procedure: PACEMAKER GENERATOR CHANGE;  Surgeon:  Sanda Klein, MD;  Location: [facility];  Service: Cardiovascular;  Laterality: N/A;    Outpatient Prescriptions Prior to Visit  Medication Sig Dispense Refill  . amiodarone (PACERONE) 200 MG tablet Take 1 tablet (200 mg total) by mouth daily. 30 tablet 6  . donepezil (ARICEPT) 10 MG tablet Take 10 mg by mouth daily.     Marland Kitchen latanoprost (XALATAN) 0.005 % ophthalmic solution Place 1 drop into the right eye at bedtime.    Marland Kitchen levothyroxine (SYNTHROID, LEVOTHROID) 88 MCG tablet Take 1 tablet by mouth daily.    . Melatonin 10 MG TABS Take 10 mg by mouth at bedtime.    . midodrine (PROAMATINE) 5 MG tablet Take 1 tablet (5 mg total) by mouth 2 (two) times daily with a meal. Please keep appt 60 tablet 2  . midodrine (PROAMATINE) 5 MG tablet Take 1 tablet (5 mg total) by mouth daily. Keep appointment. 30 tablet 0  . Multiple Vitamins-Minerals (MULTIVITAMIN WITH MINERALS) tablet Take 1 tablet by mouth daily.    . nitroGLYCERIN (NITROSTAT) 0.4 MG SL tablet Place 0.4 mg under the tongue every 5 (five) minutes as needed. For chest pain.    Marland Kitchen OLANZapine (ZYPREXA) 5 MG tablet Take 5 mg by mouth daily.    . Vitamin D, Cholecalciferol, 1000 UNITS TABS Take 1,000 Units by mouth daily.    . isosorbide mononitrate (IMDUR) 30 MG 24 hr tablet Take 30 mg by mouth daily.     No facility-administered medications prior to visit.     Allergies:   Review of patient's allergies indicates no known allergies.   Social History   Social History  . Marital Status: Married    Spouse Name: N/A  . Number of Children: N/A  . Years of Education: N/A   Social History Main Topics  . Smoking status: Never Smoker   . Smokeless tobacco: Never Used  . Alcohol Use: No  . Drug Use: No  . Sexual Activity: Not Asked   Other Topics Concern  . None   Social History Narrative     Family History:  The patient's family history includes Cancer in his mother and sister; Heart disease in his father.   ROS:   Please see the  history of present illness.    ROS All other systems reviewed and are negative.   PHYSICAL EXAM:   VS:  BP 106/72 mmHg  Pulse 80  Ht 5\' 6"  (1.676 m)  Wt 73.936 kg (163 lb)  BMI 26.32 kg/m2   GEN: Well nourished, well developed, in no acute distress HEENT: normal Neck: no JVD, carotid bruits, or masses Cardiac: Paradoxically split S2, RRR; no murmurs, rubs, or gallops,no edema, healthy L subclavian PM site Respiratory:  clear to auscultation bilaterally, normal work of breathing GI: soft, nontender, nondistended, + BS MS: no deformity or atrophy Skin: warm and dry, no rash Neuro:  Alert and Oriented x 3, Strength and sensation are intact Psych: euthymic mood, full affect  Wt Readings from Last 3 Encounters:  03/04/15 73.936 kg (163  lb)  06/26/14 73.029 kg (161 lb)  03/21/14 75.116 kg (165 lb 9.6 oz)      Studies/Labs Reviewed:   EKG:  EKG is ordered today.  The ekg ordered today demonstrates AV sequential pacing, QTC 514 ms  Recent Labs: 03/21/2014: ALT 17; BUN 25*; Creat 1.71*; Potassium 4.2; Sodium 142; TSH 0.568   Lipid Panel No results found for: CHOL, TRIG, HDL, CHOLHDL, VLDL, LDLCALC, LDLDIRECT   ASSESSMENT:    1. Cardiac pacemaker in situ   2. Symptomatic sinus bradycardia   3. Paroxysmal atrial fibrillation (HCC)   4. Orthostatic hypotension   5. Falls frequently   6. Subdural hematoma (HCC)      PLAN:  In order of problems listed above:  1. PPM:  Normal pacemaker function, remote Carelink Q3 mos, office visit yearly 2. SSS s/p pacemaker  3. Paroxysmal atrial flutter and fibrillation: continue amiodarone for rhythm and rate control (needs LSTs and TSH if not checked in last 6 mos). Will not tolerate conventional AV blocking agents due to low BP 4. Orthostatic hypotension:  Stop isosorbide, he does not have recent chest pain  5. Falls/ subdural hematoma: not a candidate for anticoagulation, but I think he should be on ASA 81 mg daily    Medication  Adjustments/Labs and Tests Ordered: Current medicines are reviewed at length with the patient today.  Concerns regarding medicines are outlined above.  Medication changes, Labs and Tests ordered today are listed in the Patient Instructions below. Patient Instructions  Your physician has recommended you make the following change in your medication:   STOP ISOSORBIDE  IF YOU ARE NOT ALREADY TAKING ASPIRIN 81 MG DAILY PLEASE START TAKING.  Remote monitoring is used to monitor your Pacemaker from home. This monitoring reduces the number of office visits required to check your device to one time per year. It allows [place] to monitor the functioning of your device to ensure it is working properly. You are scheduled for a device check from home on Jun 02, 2015. You may send your transmission at any time that day. If you have a wireless device, the transmission will be sent automatically. After your physician reviews your transmission, you will receive a postcard with your next transmission date.  Dr. Sallyanne Kuster recommends that you schedule a follow-up appointment in: [place] ([place]           Mikael Spray, MD  03/04/2015 10:58 PM    [address], [place], [place]  [address] Phone: [phone]; Fax: [phone]

## 2015-03-06 ENCOUNTER — Telehealth: Payer: Self-pay | Admitting: *Deleted

## 2015-03-06 DIAGNOSIS — Z79899 Other long term (current) drug therapy: Secondary | ICD-10-CM

## 2015-03-06 NOTE — Telephone Encounter (Signed)
Order placed for CMET and TSH and mailed to patient.  Left message to have this lab work done if not done elsewhere in the last 6 months.  If he has had done recently to please call and let us know.

## 2015-03-06 NOTE — Telephone Encounter (Signed)
-----   Message from Sanda Klein, MD sent at 03/04/2015 11:09 PM EST ----- Need CMEt and TSH within last 6 months please. If none done during recent illness (cannot find in Epic or Care everywhere) please order

## 2015-03-12 DIAGNOSIS — E785 Hyperlipidemia, unspecified: Secondary | ICD-10-CM | POA: Diagnosis not present

## 2015-03-12 DIAGNOSIS — G309 Alzheimer's disease, unspecified: Secondary | ICD-10-CM | POA: Diagnosis not present

## 2015-03-12 DIAGNOSIS — R269 Unspecified abnormalities of gait and mobility: Secondary | ICD-10-CM | POA: Diagnosis not present

## 2015-03-12 DIAGNOSIS — E039 Hypothyroidism, unspecified: Secondary | ICD-10-CM | POA: Diagnosis not present

## 2015-03-12 DIAGNOSIS — G908 Other disorders of autonomic nervous system: Secondary | ICD-10-CM | POA: Diagnosis not present

## 2015-03-12 DIAGNOSIS — I4891 Unspecified atrial fibrillation: Secondary | ICD-10-CM | POA: Diagnosis not present

## 2015-03-12 DIAGNOSIS — Z950 Presence of cardiac pacemaker: Secondary | ICD-10-CM | POA: Diagnosis not present

## 2015-03-12 DIAGNOSIS — I10 Essential (primary) hypertension: Secondary | ICD-10-CM | POA: Diagnosis not present

## 2015-03-12 DIAGNOSIS — N400 Enlarged prostate without lower urinary tract symptoms: Secondary | ICD-10-CM | POA: Diagnosis not present

## 2015-03-12 DIAGNOSIS — E559 Vitamin D deficiency, unspecified: Secondary | ICD-10-CM | POA: Diagnosis not present

## 2015-03-12 DIAGNOSIS — I482 Chronic atrial fibrillation: Secondary | ICD-10-CM | POA: Diagnosis not present

## 2015-03-12 DIAGNOSIS — G308 Other Alzheimer's disease: Secondary | ICD-10-CM | POA: Diagnosis not present

## 2015-03-14 DIAGNOSIS — G908 Other disorders of autonomic nervous system: Secondary | ICD-10-CM | POA: Diagnosis not present

## 2015-03-14 DIAGNOSIS — E559 Vitamin D deficiency, unspecified: Secondary | ICD-10-CM | POA: Diagnosis not present

## 2015-03-14 DIAGNOSIS — N400 Enlarged prostate without lower urinary tract symptoms: Secondary | ICD-10-CM | POA: Diagnosis not present

## 2015-03-14 DIAGNOSIS — I4891 Unspecified atrial fibrillation: Secondary | ICD-10-CM | POA: Diagnosis not present

## 2015-03-14 DIAGNOSIS — E039 Hypothyroidism, unspecified: Secondary | ICD-10-CM | POA: Diagnosis not present

## 2015-03-14 DIAGNOSIS — I10 Essential (primary) hypertension: Secondary | ICD-10-CM | POA: Diagnosis not present

## 2015-03-14 DIAGNOSIS — G309 Alzheimer's disease, unspecified: Secondary | ICD-10-CM | POA: Diagnosis not present

## 2015-03-14 DIAGNOSIS — E785 Hyperlipidemia, unspecified: Secondary | ICD-10-CM | POA: Diagnosis not present

## 2015-03-14 DIAGNOSIS — Z950 Presence of cardiac pacemaker: Secondary | ICD-10-CM | POA: Diagnosis not present

## 2015-03-17 DIAGNOSIS — E039 Hypothyroidism, unspecified: Secondary | ICD-10-CM | POA: Diagnosis not present

## 2015-03-17 DIAGNOSIS — E559 Vitamin D deficiency, unspecified: Secondary | ICD-10-CM | POA: Diagnosis not present

## 2015-03-17 DIAGNOSIS — I4891 Unspecified atrial fibrillation: Secondary | ICD-10-CM | POA: Diagnosis not present

## 2015-03-17 DIAGNOSIS — G309 Alzheimer's disease, unspecified: Secondary | ICD-10-CM | POA: Diagnosis not present

## 2015-03-17 DIAGNOSIS — N400 Enlarged prostate without lower urinary tract symptoms: Secondary | ICD-10-CM | POA: Diagnosis not present

## 2015-03-17 DIAGNOSIS — G908 Other disorders of autonomic nervous system: Secondary | ICD-10-CM | POA: Diagnosis not present

## 2015-03-17 DIAGNOSIS — I10 Essential (primary) hypertension: Secondary | ICD-10-CM | POA: Diagnosis not present

## 2015-03-17 DIAGNOSIS — E785 Hyperlipidemia, unspecified: Secondary | ICD-10-CM | POA: Diagnosis not present

## 2015-03-17 DIAGNOSIS — Z950 Presence of cardiac pacemaker: Secondary | ICD-10-CM | POA: Diagnosis not present

## 2015-03-19 DIAGNOSIS — E559 Vitamin D deficiency, unspecified: Secondary | ICD-10-CM | POA: Diagnosis not present

## 2015-03-19 DIAGNOSIS — I4891 Unspecified atrial fibrillation: Secondary | ICD-10-CM | POA: Diagnosis not present

## 2015-03-19 DIAGNOSIS — G908 Other disorders of autonomic nervous system: Secondary | ICD-10-CM | POA: Diagnosis not present

## 2015-03-19 DIAGNOSIS — N400 Enlarged prostate without lower urinary tract symptoms: Secondary | ICD-10-CM | POA: Diagnosis not present

## 2015-03-19 DIAGNOSIS — G309 Alzheimer's disease, unspecified: Secondary | ICD-10-CM | POA: Diagnosis not present

## 2015-03-19 DIAGNOSIS — I10 Essential (primary) hypertension: Secondary | ICD-10-CM | POA: Diagnosis not present

## 2015-03-19 DIAGNOSIS — E039 Hypothyroidism, unspecified: Secondary | ICD-10-CM | POA: Diagnosis not present

## 2015-03-19 DIAGNOSIS — E785 Hyperlipidemia, unspecified: Secondary | ICD-10-CM | POA: Diagnosis not present

## 2015-03-19 DIAGNOSIS — Z950 Presence of cardiac pacemaker: Secondary | ICD-10-CM | POA: Diagnosis not present

## 2015-03-20 DIAGNOSIS — I4891 Unspecified atrial fibrillation: Secondary | ICD-10-CM | POA: Diagnosis not present

## 2015-03-20 DIAGNOSIS — G309 Alzheimer's disease, unspecified: Secondary | ICD-10-CM | POA: Diagnosis not present

## 2015-03-20 DIAGNOSIS — E039 Hypothyroidism, unspecified: Secondary | ICD-10-CM | POA: Diagnosis not present

## 2015-03-20 DIAGNOSIS — E785 Hyperlipidemia, unspecified: Secondary | ICD-10-CM | POA: Diagnosis not present

## 2015-03-20 DIAGNOSIS — G908 Other disorders of autonomic nervous system: Secondary | ICD-10-CM | POA: Diagnosis not present

## 2015-03-20 DIAGNOSIS — Z950 Presence of cardiac pacemaker: Secondary | ICD-10-CM | POA: Diagnosis not present

## 2015-03-20 DIAGNOSIS — I10 Essential (primary) hypertension: Secondary | ICD-10-CM | POA: Diagnosis not present

## 2015-03-20 DIAGNOSIS — N400 Enlarged prostate without lower urinary tract symptoms: Secondary | ICD-10-CM | POA: Diagnosis not present

## 2015-03-20 DIAGNOSIS — E559 Vitamin D deficiency, unspecified: Secondary | ICD-10-CM | POA: Diagnosis not present

## 2015-03-24 DIAGNOSIS — Z950 Presence of cardiac pacemaker: Secondary | ICD-10-CM | POA: Diagnosis not present

## 2015-03-24 DIAGNOSIS — G908 Other disorders of autonomic nervous system: Secondary | ICD-10-CM | POA: Diagnosis not present

## 2015-03-24 DIAGNOSIS — I4891 Unspecified atrial fibrillation: Secondary | ICD-10-CM | POA: Diagnosis not present

## 2015-03-24 DIAGNOSIS — E785 Hyperlipidemia, unspecified: Secondary | ICD-10-CM | POA: Diagnosis not present

## 2015-03-24 DIAGNOSIS — N400 Enlarged prostate without lower urinary tract symptoms: Secondary | ICD-10-CM | POA: Diagnosis not present

## 2015-03-24 DIAGNOSIS — I10 Essential (primary) hypertension: Secondary | ICD-10-CM | POA: Diagnosis not present

## 2015-03-24 DIAGNOSIS — E559 Vitamin D deficiency, unspecified: Secondary | ICD-10-CM | POA: Diagnosis not present

## 2015-03-24 DIAGNOSIS — E039 Hypothyroidism, unspecified: Secondary | ICD-10-CM | POA: Diagnosis not present

## 2015-03-24 DIAGNOSIS — G309 Alzheimer's disease, unspecified: Secondary | ICD-10-CM | POA: Diagnosis not present

## 2015-03-25 DIAGNOSIS — N400 Enlarged prostate without lower urinary tract symptoms: Secondary | ICD-10-CM | POA: Diagnosis not present

## 2015-03-25 DIAGNOSIS — Z950 Presence of cardiac pacemaker: Secondary | ICD-10-CM | POA: Diagnosis not present

## 2015-03-25 DIAGNOSIS — G309 Alzheimer's disease, unspecified: Secondary | ICD-10-CM | POA: Diagnosis not present

## 2015-03-25 DIAGNOSIS — E785 Hyperlipidemia, unspecified: Secondary | ICD-10-CM | POA: Diagnosis not present

## 2015-03-25 DIAGNOSIS — I10 Essential (primary) hypertension: Secondary | ICD-10-CM | POA: Diagnosis not present

## 2015-03-25 DIAGNOSIS — E039 Hypothyroidism, unspecified: Secondary | ICD-10-CM | POA: Diagnosis not present

## 2015-03-25 DIAGNOSIS — E559 Vitamin D deficiency, unspecified: Secondary | ICD-10-CM | POA: Diagnosis not present

## 2015-03-25 DIAGNOSIS — G908 Other disorders of autonomic nervous system: Secondary | ICD-10-CM | POA: Diagnosis not present

## 2015-03-25 DIAGNOSIS — I4891 Unspecified atrial fibrillation: Secondary | ICD-10-CM | POA: Diagnosis not present

## 2015-03-26 DIAGNOSIS — I10 Essential (primary) hypertension: Secondary | ICD-10-CM | POA: Diagnosis not present

## 2015-03-26 DIAGNOSIS — N400 Enlarged prostate without lower urinary tract symptoms: Secondary | ICD-10-CM | POA: Diagnosis not present

## 2015-03-26 DIAGNOSIS — E785 Hyperlipidemia, unspecified: Secondary | ICD-10-CM | POA: Diagnosis not present

## 2015-03-26 DIAGNOSIS — E559 Vitamin D deficiency, unspecified: Secondary | ICD-10-CM | POA: Diagnosis not present

## 2015-03-26 DIAGNOSIS — Z950 Presence of cardiac pacemaker: Secondary | ICD-10-CM | POA: Diagnosis not present

## 2015-03-26 DIAGNOSIS — E039 Hypothyroidism, unspecified: Secondary | ICD-10-CM | POA: Diagnosis not present

## 2015-03-26 DIAGNOSIS — G309 Alzheimer's disease, unspecified: Secondary | ICD-10-CM | POA: Diagnosis not present

## 2015-03-26 DIAGNOSIS — I4891 Unspecified atrial fibrillation: Secondary | ICD-10-CM | POA: Diagnosis not present

## 2015-03-26 DIAGNOSIS — G908 Other disorders of autonomic nervous system: Secondary | ICD-10-CM | POA: Diagnosis not present

## 2015-03-28 DIAGNOSIS — N400 Enlarged prostate without lower urinary tract symptoms: Secondary | ICD-10-CM | POA: Diagnosis not present

## 2015-03-28 DIAGNOSIS — E039 Hypothyroidism, unspecified: Secondary | ICD-10-CM | POA: Diagnosis not present

## 2015-03-28 DIAGNOSIS — I4891 Unspecified atrial fibrillation: Secondary | ICD-10-CM | POA: Diagnosis not present

## 2015-03-28 DIAGNOSIS — E785 Hyperlipidemia, unspecified: Secondary | ICD-10-CM | POA: Diagnosis not present

## 2015-03-28 DIAGNOSIS — G309 Alzheimer's disease, unspecified: Secondary | ICD-10-CM | POA: Diagnosis not present

## 2015-03-28 DIAGNOSIS — Z950 Presence of cardiac pacemaker: Secondary | ICD-10-CM | POA: Diagnosis not present

## 2015-03-28 DIAGNOSIS — E559 Vitamin D deficiency, unspecified: Secondary | ICD-10-CM | POA: Diagnosis not present

## 2015-03-28 DIAGNOSIS — G908 Other disorders of autonomic nervous system: Secondary | ICD-10-CM | POA: Diagnosis not present

## 2015-03-28 DIAGNOSIS — I10 Essential (primary) hypertension: Secondary | ICD-10-CM | POA: Diagnosis not present

## 2015-03-31 ENCOUNTER — Other Ambulatory Visit: Payer: Self-pay | Admitting: Cardiovascular Disease

## 2015-03-31 DIAGNOSIS — Z6824 Body mass index (BMI) 24.0-24.9, adult: Secondary | ICD-10-CM | POA: Diagnosis not present

## 2015-03-31 DIAGNOSIS — I10 Essential (primary) hypertension: Secondary | ICD-10-CM | POA: Diagnosis not present

## 2015-03-31 DIAGNOSIS — N3001 Acute cystitis with hematuria: Secondary | ICD-10-CM | POA: Diagnosis not present

## 2015-03-31 LAB — CUP PACEART INCLINIC DEVICE CHECK
Battery Remaining Longevity: 79 mo
Brady Statistic AP VS Percent: 55.9 %
Brady Statistic AS VS Percent: 0.28 %
Brady Statistic RV Percent Paced: 43.81 %
Date Time Interrogation Session: 20170228155422
Implantable Lead Implant Date: 20010315
Implantable Lead Location: 753859
Lead Channel Impedance Value: 532 Ohm
Lead Channel Pacing Threshold Pulse Width: 0.4 ms
Lead Channel Sensing Intrinsic Amplitude: 2.75 mV
Lead Channel Setting Pacing Amplitude: 2 V
Lead Channel Setting Pacing Pulse Width: 0.4 ms
Lead Channel Setting Sensing Sensitivity: 2 mV
MDC IDC LEAD IMPLANT DT: 20010315
MDC IDC LEAD LOCATION: 753860
MDC IDC LEAD MODEL: 4592
MDC IDC MSMT BATTERY VOLTAGE: 3 V
MDC IDC MSMT LEADCHNL RA IMPEDANCE VALUE: 266 Ohm
MDC IDC MSMT LEADCHNL RA IMPEDANCE VALUE: 323 Ohm
MDC IDC MSMT LEADCHNL RA PACING THRESHOLD AMPLITUDE: 0.5 V
MDC IDC MSMT LEADCHNL RA SENSING INTR AMPL: 2 mV
MDC IDC MSMT LEADCHNL RA SENSING INTR AMPL: 2 mV
MDC IDC MSMT LEADCHNL RV IMPEDANCE VALUE: 494 Ohm
MDC IDC MSMT LEADCHNL RV PACING THRESHOLD AMPLITUDE: 0.75 V
MDC IDC MSMT LEADCHNL RV PACING THRESHOLD PULSEWIDTH: 0.4 ms
MDC IDC MSMT LEADCHNL RV SENSING INTR AMPL: 2.75 mV
MDC IDC SET LEADCHNL RA PACING AMPLITUDE: 2 V
MDC IDC STAT BRADY AP VP PERCENT: 42.69 %
MDC IDC STAT BRADY AS VP PERCENT: 1.12 %
MDC IDC STAT BRADY RA PERCENT PACED: 98.59 %

## 2015-04-12 DIAGNOSIS — R269 Unspecified abnormalities of gait and mobility: Secondary | ICD-10-CM | POA: Diagnosis not present

## 2015-04-15 ENCOUNTER — Ambulatory Visit (INDEPENDENT_AMBULATORY_CARE_PROVIDER_SITE_OTHER): Payer: Medicare Other | Admitting: Urology

## 2015-04-15 DIAGNOSIS — R351 Nocturia: Secondary | ICD-10-CM

## 2015-04-15 DIAGNOSIS — N3281 Overactive bladder: Secondary | ICD-10-CM | POA: Diagnosis not present

## 2015-04-15 DIAGNOSIS — N390 Urinary tract infection, site not specified: Secondary | ICD-10-CM | POA: Diagnosis not present

## 2015-04-15 DIAGNOSIS — R3915 Urgency of urination: Secondary | ICD-10-CM | POA: Diagnosis not present

## 2015-04-23 DIAGNOSIS — M4120 Other idiopathic scoliosis, site unspecified: Secondary | ICD-10-CM | POA: Diagnosis not present

## 2015-04-25 ENCOUNTER — Other Ambulatory Visit: Payer: Self-pay | Admitting: Cardiovascular Disease

## 2015-04-25 NOTE — Telephone Encounter (Signed)
REFILL 

## 2015-05-12 DIAGNOSIS — R269 Unspecified abnormalities of gait and mobility: Secondary | ICD-10-CM | POA: Diagnosis not present

## 2015-05-22 DIAGNOSIS — M545 Low back pain: Secondary | ICD-10-CM | POA: Diagnosis not present

## 2015-05-22 DIAGNOSIS — M4120 Other idiopathic scoliosis, site unspecified: Secondary | ICD-10-CM | POA: Diagnosis not present

## 2015-06-03 ENCOUNTER — Telehealth: Payer: Self-pay | Admitting: Cardiology

## 2015-06-03 ENCOUNTER — Ambulatory Visit (INDEPENDENT_AMBULATORY_CARE_PROVIDER_SITE_OTHER): Payer: Medicare Other | Admitting: *Deleted

## 2015-06-03 DIAGNOSIS — I495 Sick sinus syndrome: Secondary | ICD-10-CM | POA: Diagnosis not present

## 2015-06-03 DIAGNOSIS — Z950 Presence of cardiac pacemaker: Secondary | ICD-10-CM

## 2015-06-03 NOTE — Telephone Encounter (Signed)
Confirmed remote transmission w/ pt daughter.   

## 2015-06-04 NOTE — Progress Notes (Signed)
Remote pacemaker transmission.   

## 2015-06-12 DIAGNOSIS — R269 Unspecified abnormalities of gait and mobility: Secondary | ICD-10-CM | POA: Diagnosis not present

## 2015-06-17 LAB — CUP PACEART REMOTE DEVICE CHECK
Battery Remaining Longevity: 79 mo
Battery Voltage: 3.01 V
Brady Statistic RA Percent Paced: 97.75 %
Brady Statistic RV Percent Paced: 40.01 %
Date Time Interrogation Session: 20170530221957
Implantable Lead Implant Date: 20010315
Implantable Lead Location: 753859
Implantable Lead Model: 4592
Implantable Lead Model: 5092
Lead Channel Impedance Value: 513 Ohm
Lead Channel Pacing Threshold Amplitude: 0.5 V
Lead Channel Pacing Threshold Pulse Width: 0.4 ms
Lead Channel Sensing Intrinsic Amplitude: 2.125 mV
Lead Channel Setting Sensing Sensitivity: 2 mV
MDC IDC LEAD IMPLANT DT: 20010315
MDC IDC LEAD LOCATION: 753860
MDC IDC MSMT LEADCHNL RA IMPEDANCE VALUE: 247 Ohm
MDC IDC MSMT LEADCHNL RA IMPEDANCE VALUE: 304 Ohm
MDC IDC MSMT LEADCHNL RA SENSING INTR AMPL: 2.125 mV
MDC IDC MSMT LEADCHNL RV IMPEDANCE VALUE: 475 Ohm
MDC IDC MSMT LEADCHNL RV PACING THRESHOLD AMPLITUDE: 0.875 V
MDC IDC MSMT LEADCHNL RV PACING THRESHOLD PULSEWIDTH: 0.4 ms
MDC IDC MSMT LEADCHNL RV SENSING INTR AMPL: 3.25 mV
MDC IDC MSMT LEADCHNL RV SENSING INTR AMPL: 3.25 mV
MDC IDC SET LEADCHNL RA PACING AMPLITUDE: 2 V
MDC IDC SET LEADCHNL RV PACING AMPLITUDE: 2 V
MDC IDC SET LEADCHNL RV PACING PULSEWIDTH: 0.4 ms
MDC IDC STAT BRADY AP VP PERCENT: 37.89 %
MDC IDC STAT BRADY AP VS PERCENT: 59.86 %
MDC IDC STAT BRADY AS VP PERCENT: 2.12 %
MDC IDC STAT BRADY AS VS PERCENT: 0.13 %

## 2015-06-24 ENCOUNTER — Encounter: Payer: Self-pay | Admitting: Cardiology

## 2015-07-01 DIAGNOSIS — I10 Essential (primary) hypertension: Secondary | ICD-10-CM | POA: Diagnosis not present

## 2015-07-01 DIAGNOSIS — I482 Chronic atrial fibrillation: Secondary | ICD-10-CM | POA: Diagnosis not present

## 2015-07-01 DIAGNOSIS — E038 Other specified hypothyroidism: Secondary | ICD-10-CM | POA: Diagnosis not present

## 2015-07-01 DIAGNOSIS — E784 Other hyperlipidemia: Secondary | ICD-10-CM | POA: Diagnosis not present

## 2015-07-02 DIAGNOSIS — H9191 Unspecified hearing loss, right ear: Secondary | ICD-10-CM | POA: Diagnosis not present

## 2015-07-02 DIAGNOSIS — E038 Other specified hypothyroidism: Secondary | ICD-10-CM | POA: Diagnosis not present

## 2015-07-02 DIAGNOSIS — H93293 Other abnormal auditory perceptions, bilateral: Secondary | ICD-10-CM | POA: Diagnosis not present

## 2015-07-02 DIAGNOSIS — Z01118 Encounter for examination of ears and hearing with other abnormal findings: Secondary | ICD-10-CM | POA: Diagnosis not present

## 2015-07-02 DIAGNOSIS — E784 Other hyperlipidemia: Secondary | ICD-10-CM | POA: Diagnosis not present

## 2015-07-02 DIAGNOSIS — I10 Essential (primary) hypertension: Secondary | ICD-10-CM | POA: Diagnosis not present

## 2015-07-02 DIAGNOSIS — H90A32 Mixed conductive and sensorineural hearing loss, unilateral, left ear with restricted hearing on the contralateral side: Secondary | ICD-10-CM | POA: Diagnosis not present

## 2015-07-03 DIAGNOSIS — M4120 Other idiopathic scoliosis, site unspecified: Secondary | ICD-10-CM | POA: Diagnosis not present

## 2015-07-03 DIAGNOSIS — M25552 Pain in left hip: Secondary | ICD-10-CM | POA: Diagnosis not present

## 2015-07-23 ENCOUNTER — Ambulatory Visit: Payer: Medicare Other | Admitting: Urology

## 2015-07-25 ENCOUNTER — Other Ambulatory Visit: Payer: Self-pay | Admitting: Cardiovascular Disease

## 2015-07-25 NOTE — Telephone Encounter (Signed)
REFILL 

## 2015-08-11 DIAGNOSIS — H348312 Tributary (branch) retinal vein occlusion, right eye, stable: Secondary | ICD-10-CM | POA: Diagnosis not present

## 2015-08-11 DIAGNOSIS — H353131 Nonexudative age-related macular degeneration, bilateral, early dry stage: Secondary | ICD-10-CM | POA: Diagnosis not present

## 2015-08-11 DIAGNOSIS — H4050X0 Glaucoma secondary to other eye disorders, unspecified eye, stage unspecified: Secondary | ICD-10-CM | POA: Diagnosis not present

## 2015-08-11 DIAGNOSIS — H35363 Drusen (degenerative) of macula, bilateral: Secondary | ICD-10-CM | POA: Diagnosis not present

## 2015-08-14 DIAGNOSIS — M545 Low back pain: Secondary | ICD-10-CM | POA: Diagnosis not present

## 2015-08-14 DIAGNOSIS — M4120 Other idiopathic scoliosis, site unspecified: Secondary | ICD-10-CM | POA: Diagnosis not present

## 2015-09-02 ENCOUNTER — Telehealth: Payer: Self-pay | Admitting: Cardiology

## 2015-09-02 ENCOUNTER — Encounter: Payer: Medicare Other | Admitting: *Deleted

## 2015-09-02 NOTE — Telephone Encounter (Signed)
LMOVM reminding pt to send remote transmission.   

## 2015-09-05 ENCOUNTER — Encounter: Payer: Self-pay | Admitting: Cardiology

## 2015-09-17 DIAGNOSIS — M4120 Other idiopathic scoliosis, site unspecified: Secondary | ICD-10-CM | POA: Diagnosis not present

## 2015-09-17 DIAGNOSIS — M25552 Pain in left hip: Secondary | ICD-10-CM | POA: Diagnosis not present

## 2015-09-27 ENCOUNTER — Other Ambulatory Visit: Payer: Self-pay | Admitting: Cardiovascular Disease

## 2015-10-02 DIAGNOSIS — M4186 Other forms of scoliosis, lumbar region: Secondary | ICD-10-CM | POA: Diagnosis not present

## 2015-10-02 DIAGNOSIS — N182 Chronic kidney disease, stage 2 (mild): Secondary | ICD-10-CM | POA: Diagnosis not present

## 2015-10-02 DIAGNOSIS — E784 Other hyperlipidemia: Secondary | ICD-10-CM | POA: Diagnosis not present

## 2015-10-02 DIAGNOSIS — I10 Essential (primary) hypertension: Secondary | ICD-10-CM | POA: Diagnosis not present

## 2015-10-09 ENCOUNTER — Telehealth: Payer: Self-pay | Admitting: Cardiology

## 2015-10-09 NOTE — Telephone Encounter (Signed)
LMOVM informing pt that his remote transmission had been rescheduled to Thursday 10-16-15.

## 2015-10-09 NOTE — Telephone Encounter (Signed)
New Message:    Pt needs an appointment for remote check. Just leave it on her voicemail if she does not answer.

## 2015-10-16 ENCOUNTER — Ambulatory Visit (INDEPENDENT_AMBULATORY_CARE_PROVIDER_SITE_OTHER): Payer: Medicare Other | Admitting: *Deleted

## 2015-10-16 ENCOUNTER — Telehealth: Payer: Self-pay | Admitting: Cardiology

## 2015-10-16 DIAGNOSIS — I495 Sick sinus syndrome: Secondary | ICD-10-CM

## 2015-10-16 NOTE — Telephone Encounter (Signed)
LMOVM reminding pt to send remote transmission.   

## 2015-10-17 ENCOUNTER — Encounter: Payer: Self-pay | Admitting: Cardiology

## 2015-10-17 NOTE — Progress Notes (Signed)
Remote pacemaker transmission.   

## 2015-11-13 DIAGNOSIS — M4120 Other idiopathic scoliosis, site unspecified: Secondary | ICD-10-CM | POA: Diagnosis not present

## 2015-11-13 DIAGNOSIS — M48061 Spinal stenosis, lumbar region without neurogenic claudication: Secondary | ICD-10-CM | POA: Diagnosis not present

## 2015-11-17 LAB — CUP PACEART REMOTE DEVICE CHECK
Battery Remaining Longevity: 68 mo
Battery Voltage: 3 V
Brady Statistic AP VS Percent: 57.2 %
Brady Statistic AS VS Percent: 0.25 %
Brady Statistic RV Percent Paced: 42.55 %
Date Time Interrogation Session: 20171012221423
Implantable Lead Implant Date: 20010315
Implantable Lead Implant Date: 20010315
Implantable Lead Model: 4592
Implantable Pulse Generator Implant Date: 20150721
Lead Channel Impedance Value: 247 Ohm
Lead Channel Impedance Value: 513 Ohm
Lead Channel Pacing Threshold Amplitude: 0.5 V
Lead Channel Pacing Threshold Amplitude: 1 V
Lead Channel Pacing Threshold Pulse Width: 0.4 ms
Lead Channel Sensing Intrinsic Amplitude: 2 mV
Lead Channel Sensing Intrinsic Amplitude: 2 mV
Lead Channel Sensing Intrinsic Amplitude: 3.375 mV
Lead Channel Setting Sensing Sensitivity: 2 mV
MDC IDC LEAD LOCATION: 753859
MDC IDC LEAD LOCATION: 753860
MDC IDC MSMT LEADCHNL RA IMPEDANCE VALUE: 323 Ohm
MDC IDC MSMT LEADCHNL RA PACING THRESHOLD PULSEWIDTH: 0.4 ms
MDC IDC MSMT LEADCHNL RV IMPEDANCE VALUE: 494 Ohm
MDC IDC MSMT LEADCHNL RV SENSING INTR AMPL: 3.375 mV
MDC IDC SET LEADCHNL RA PACING AMPLITUDE: 2 V
MDC IDC SET LEADCHNL RV PACING AMPLITUDE: 2 V
MDC IDC SET LEADCHNL RV PACING PULSEWIDTH: 0.4 ms
MDC IDC STAT BRADY AP VP PERCENT: 36.3 %
MDC IDC STAT BRADY AS VP PERCENT: 6.25 %
MDC IDC STAT BRADY RA PERCENT PACED: 93.5 %

## 2015-11-17 NOTE — Progress Notes (Signed)
Normal remote reviewed. 5.1% AF, atrial ATP 53% successful, V rates controlled, +amiodarone.  Not a candidate for [facility] per prior notes (falls/SDH).  Next Carelink 01/15/16

## 2015-11-18 DIAGNOSIS — H353131 Nonexudative age-related macular degeneration, bilateral, early dry stage: Secondary | ICD-10-CM | POA: Diagnosis not present

## 2015-11-18 DIAGNOSIS — H35363 Drusen (degenerative) of macula, bilateral: Secondary | ICD-10-CM | POA: Diagnosis not present

## 2015-11-18 DIAGNOSIS — H348312 Tributary (branch) retinal vein occlusion, right eye, stable: Secondary | ICD-10-CM | POA: Diagnosis not present

## 2015-11-30 DIAGNOSIS — K5909 Other constipation: Secondary | ICD-10-CM | POA: Diagnosis not present

## 2015-11-30 DIAGNOSIS — N390 Urinary tract infection, site not specified: Secondary | ICD-10-CM | POA: Diagnosis not present

## 2015-11-30 DIAGNOSIS — G908 Other disorders of autonomic nervous system: Secondary | ICD-10-CM | POA: Diagnosis not present

## 2015-11-30 DIAGNOSIS — R7989 Other specified abnormal findings of blood chemistry: Secondary | ICD-10-CM | POA: Diagnosis not present

## 2015-11-30 DIAGNOSIS — Z950 Presence of cardiac pacemaker: Secondary | ICD-10-CM | POA: Diagnosis not present

## 2015-11-30 DIAGNOSIS — Z803 Family history of malignant neoplasm of breast: Secondary | ICD-10-CM | POA: Diagnosis not present

## 2015-11-30 DIAGNOSIS — N179 Acute kidney failure, unspecified: Secondary | ICD-10-CM | POA: Diagnosis not present

## 2015-11-30 DIAGNOSIS — N189 Chronic kidney disease, unspecified: Secondary | ICD-10-CM | POA: Diagnosis not present

## 2015-11-30 DIAGNOSIS — E46 Unspecified protein-calorie malnutrition: Secondary | ICD-10-CM | POA: Diagnosis not present

## 2015-11-30 DIAGNOSIS — R319 Hematuria, unspecified: Secondary | ICD-10-CM | POA: Diagnosis not present

## 2015-11-30 DIAGNOSIS — E559 Vitamin D deficiency, unspecified: Secondary | ICD-10-CM | POA: Diagnosis not present

## 2015-11-30 DIAGNOSIS — N400 Enlarged prostate without lower urinary tract symptoms: Secondary | ICD-10-CM | POA: Diagnosis not present

## 2015-11-30 DIAGNOSIS — I129 Hypertensive chronic kidney disease with stage 1 through stage 4 chronic kidney disease, or unspecified chronic kidney disease: Secondary | ICD-10-CM | POA: Diagnosis not present

## 2015-11-30 DIAGNOSIS — R410 Disorientation, unspecified: Secondary | ICD-10-CM | POA: Diagnosis not present

## 2015-11-30 DIAGNOSIS — H409 Unspecified glaucoma: Secondary | ICD-10-CM | POA: Diagnosis not present

## 2015-11-30 DIAGNOSIS — I4891 Unspecified atrial fibrillation: Secondary | ICD-10-CM | POA: Diagnosis not present

## 2015-11-30 DIAGNOSIS — Z79899 Other long term (current) drug therapy: Secondary | ICD-10-CM | POA: Diagnosis not present

## 2015-11-30 DIAGNOSIS — R0789 Other chest pain: Secondary | ICD-10-CM | POA: Diagnosis not present

## 2015-11-30 DIAGNOSIS — Z8249 Family history of ischemic heart disease and other diseases of the circulatory system: Secondary | ICD-10-CM | POA: Diagnosis not present

## 2015-11-30 DIAGNOSIS — E785 Hyperlipidemia, unspecified: Secondary | ICD-10-CM | POA: Diagnosis not present

## 2015-11-30 DIAGNOSIS — R079 Chest pain, unspecified: Secondary | ICD-10-CM | POA: Diagnosis not present

## 2015-11-30 DIAGNOSIS — E039 Hypothyroidism, unspecified: Secondary | ICD-10-CM | POA: Diagnosis not present

## 2015-12-01 DIAGNOSIS — N178 Other acute kidney failure: Secondary | ICD-10-CM | POA: Diagnosis not present

## 2015-12-01 DIAGNOSIS — N184 Chronic kidney disease, stage 4 (severe): Secondary | ICD-10-CM | POA: Diagnosis not present

## 2015-12-01 DIAGNOSIS — N390 Urinary tract infection, site not specified: Secondary | ICD-10-CM | POA: Diagnosis not present

## 2015-12-01 DIAGNOSIS — R0789 Other chest pain: Secondary | ICD-10-CM | POA: Diagnosis not present

## 2015-12-02 DIAGNOSIS — N184 Chronic kidney disease, stage 4 (severe): Secondary | ICD-10-CM | POA: Diagnosis not present

## 2015-12-02 DIAGNOSIS — N390 Urinary tract infection, site not specified: Secondary | ICD-10-CM | POA: Diagnosis not present

## 2015-12-02 DIAGNOSIS — N178 Other acute kidney failure: Secondary | ICD-10-CM | POA: Diagnosis not present

## 2015-12-02 DIAGNOSIS — R0789 Other chest pain: Secondary | ICD-10-CM | POA: Diagnosis not present

## 2015-12-03 DIAGNOSIS — N178 Other acute kidney failure: Secondary | ICD-10-CM | POA: Diagnosis not present

## 2015-12-03 DIAGNOSIS — R0789 Other chest pain: Secondary | ICD-10-CM | POA: Diagnosis not present

## 2015-12-03 DIAGNOSIS — N184 Chronic kidney disease, stage 4 (severe): Secondary | ICD-10-CM | POA: Diagnosis not present

## 2015-12-03 DIAGNOSIS — N390 Urinary tract infection, site not specified: Secondary | ICD-10-CM | POA: Diagnosis not present

## 2015-12-11 DIAGNOSIS — M4120 Other idiopathic scoliosis, site unspecified: Secondary | ICD-10-CM | POA: Diagnosis not present

## 2015-12-11 DIAGNOSIS — I10 Essential (primary) hypertension: Secondary | ICD-10-CM | POA: Diagnosis not present

## 2015-12-11 DIAGNOSIS — M48061 Spinal stenosis, lumbar region without neurogenic claudication: Secondary | ICD-10-CM | POA: Diagnosis not present

## 2015-12-11 DIAGNOSIS — M4807 Spinal stenosis, lumbosacral region: Secondary | ICD-10-CM | POA: Diagnosis not present

## 2015-12-16 DIAGNOSIS — R5383 Other fatigue: Secondary | ICD-10-CM | POA: Diagnosis not present

## 2015-12-16 DIAGNOSIS — N3080 Other cystitis without hematuria: Secondary | ICD-10-CM | POA: Diagnosis not present

## 2015-12-16 DIAGNOSIS — Z0000 Encounter for general adult medical examination without abnormal findings: Secondary | ICD-10-CM | POA: Diagnosis not present

## 2015-12-16 DIAGNOSIS — Z1389 Encounter for screening for other disorder: Secondary | ICD-10-CM | POA: Diagnosis not present

## 2015-12-16 DIAGNOSIS — I482 Chronic atrial fibrillation: Secondary | ICD-10-CM | POA: Diagnosis not present

## 2015-12-16 DIAGNOSIS — E038 Other specified hypothyroidism: Secondary | ICD-10-CM | POA: Diagnosis not present

## 2015-12-16 DIAGNOSIS — N182 Chronic kidney disease, stage 2 (mild): Secondary | ICD-10-CM | POA: Diagnosis not present

## 2015-12-16 DIAGNOSIS — I10 Essential (primary) hypertension: Secondary | ICD-10-CM | POA: Diagnosis not present

## 2016-01-15 ENCOUNTER — Ambulatory Visit (INDEPENDENT_AMBULATORY_CARE_PROVIDER_SITE_OTHER): Payer: Medicare Other | Admitting: *Deleted

## 2016-01-15 ENCOUNTER — Telehealth: Payer: Self-pay | Admitting: Cardiology

## 2016-01-15 DIAGNOSIS — I495 Sick sinus syndrome: Secondary | ICD-10-CM

## 2016-01-15 NOTE — Telephone Encounter (Signed)
LMOVM reminding pt to send remote transmission.   

## 2016-01-16 ENCOUNTER — Encounter: Payer: Self-pay | Admitting: Cardiology

## 2016-01-16 NOTE — Progress Notes (Signed)
Remote pacemaker transmission.   

## 2016-01-26 LAB — CUP PACEART REMOTE DEVICE CHECK
Battery Remaining Longevity: 46 mo
Battery Voltage: 2.98 V
Brady Statistic RA Percent Paced: 61.46 %
Brady Statistic RV Percent Paced: 75.77 %
Date Time Interrogation Session: 20180112003500
Implantable Lead Implant Date: 20010315
Implantable Lead Location: 753859
Implantable Lead Location: 753860
Implantable Lead Model: 4592
Implantable Pulse Generator Implant Date: 20150721
Lead Channel Impedance Value: 494 Ohm
Lead Channel Pacing Threshold Amplitude: 0.5 V
Lead Channel Pacing Threshold Pulse Width: 0.4 ms
Lead Channel Sensing Intrinsic Amplitude: 1.5 mV
Lead Channel Sensing Intrinsic Amplitude: 3.125 mV
Lead Channel Setting Pacing Amplitude: 2 V
Lead Channel Setting Sensing Sensitivity: 2 mV
MDC IDC LEAD IMPLANT DT: 20010315
MDC IDC MSMT LEADCHNL RA IMPEDANCE VALUE: 247 Ohm
MDC IDC MSMT LEADCHNL RA IMPEDANCE VALUE: 323 Ohm
MDC IDC MSMT LEADCHNL RA SENSING INTR AMPL: 1.5 mV
MDC IDC MSMT LEADCHNL RV IMPEDANCE VALUE: 456 Ohm
MDC IDC MSMT LEADCHNL RV PACING THRESHOLD AMPLITUDE: 1 V
MDC IDC MSMT LEADCHNL RV PACING THRESHOLD PULSEWIDTH: 0.4 ms
MDC IDC MSMT LEADCHNL RV SENSING INTR AMPL: 3.125 mV
MDC IDC SET LEADCHNL RA PACING AMPLITUDE: 2 V
MDC IDC SET LEADCHNL RV PACING PULSEWIDTH: 0.4 ms
MDC IDC STAT BRADY AP VP PERCENT: 43.25 %
MDC IDC STAT BRADY AP VS PERCENT: 23.55 %
MDC IDC STAT BRADY AS VP PERCENT: 32.56 %
MDC IDC STAT BRADY AS VS PERCENT: 0.64 %

## 2016-01-27 DIAGNOSIS — M1611 Unilateral primary osteoarthritis, right hip: Secondary | ICD-10-CM | POA: Diagnosis not present

## 2016-01-27 DIAGNOSIS — M25551 Pain in right hip: Secondary | ICD-10-CM | POA: Diagnosis not present

## 2016-01-27 DIAGNOSIS — Z9181 History of falling: Secondary | ICD-10-CM | POA: Diagnosis not present

## 2016-02-05 DIAGNOSIS — R531 Weakness: Secondary | ICD-10-CM | POA: Diagnosis not present

## 2016-02-05 DIAGNOSIS — N179 Acute kidney failure, unspecified: Secondary | ICD-10-CM | POA: Diagnosis not present

## 2016-02-05 DIAGNOSIS — R05 Cough: Secondary | ICD-10-CM | POA: Diagnosis not present

## 2016-02-05 DIAGNOSIS — J0100 Acute maxillary sinusitis, unspecified: Secondary | ICD-10-CM | POA: Diagnosis not present

## 2016-02-05 DIAGNOSIS — E860 Dehydration: Secondary | ICD-10-CM | POA: Diagnosis not present

## 2016-02-05 DIAGNOSIS — R319 Hematuria, unspecified: Secondary | ICD-10-CM | POA: Diagnosis not present

## 2016-02-05 DIAGNOSIS — N390 Urinary tract infection, site not specified: Secondary | ICD-10-CM | POA: Diagnosis not present

## 2016-02-06 DIAGNOSIS — R319 Hematuria, unspecified: Secondary | ICD-10-CM | POA: Diagnosis not present

## 2016-02-06 DIAGNOSIS — H4089 Other specified glaucoma: Secondary | ICD-10-CM | POA: Diagnosis not present

## 2016-02-06 DIAGNOSIS — E039 Hypothyroidism, unspecified: Secondary | ICD-10-CM | POA: Diagnosis not present

## 2016-02-06 DIAGNOSIS — N179 Acute kidney failure, unspecified: Secondary | ICD-10-CM | POA: Diagnosis not present

## 2016-02-06 DIAGNOSIS — I482 Chronic atrial fibrillation: Secondary | ICD-10-CM | POA: Diagnosis not present

## 2016-02-06 DIAGNOSIS — N390 Urinary tract infection, site not specified: Secondary | ICD-10-CM | POA: Diagnosis not present

## 2016-02-06 DIAGNOSIS — K5900 Constipation, unspecified: Secondary | ICD-10-CM | POA: Diagnosis not present

## 2016-02-06 DIAGNOSIS — R05 Cough: Secondary | ICD-10-CM | POA: Diagnosis not present

## 2016-02-06 DIAGNOSIS — G308 Other Alzheimer's disease: Secondary | ICD-10-CM | POA: Diagnosis not present

## 2016-02-06 DIAGNOSIS — E538 Deficiency of other specified B group vitamins: Secondary | ICD-10-CM | POA: Diagnosis not present

## 2016-02-06 DIAGNOSIS — R279 Unspecified lack of coordination: Secondary | ICD-10-CM | POA: Diagnosis not present

## 2016-02-06 DIAGNOSIS — R531 Weakness: Secondary | ICD-10-CM | POA: Diagnosis not present

## 2016-02-06 DIAGNOSIS — Z7401 Bed confinement status: Secondary | ICD-10-CM | POA: Diagnosis not present

## 2016-02-06 DIAGNOSIS — N178 Other acute kidney failure: Secondary | ICD-10-CM | POA: Diagnosis not present

## 2016-02-06 DIAGNOSIS — G309 Alzheimer's disease, unspecified: Secondary | ICD-10-CM | POA: Diagnosis not present

## 2016-02-06 DIAGNOSIS — J0100 Acute maxillary sinusitis, unspecified: Secondary | ICD-10-CM | POA: Diagnosis not present

## 2016-02-06 DIAGNOSIS — E785 Hyperlipidemia, unspecified: Secondary | ICD-10-CM | POA: Diagnosis not present

## 2016-02-06 DIAGNOSIS — E860 Dehydration: Secondary | ICD-10-CM | POA: Diagnosis not present

## 2016-02-06 DIAGNOSIS — E870 Hyperosmolality and hypernatremia: Secondary | ICD-10-CM | POA: Diagnosis not present

## 2016-02-10 ENCOUNTER — Telehealth: Payer: Self-pay | Admitting: Cardiology

## 2016-02-10 NOTE — Telephone Encounter (Signed)
Pt daughter called and wanted to know if we turned off pacemakers b/c hospice wants to have pt PPM turned off and she does not want it turned off. I informed pt daughter that we do not turn PPM off and if hospice has any further questions hospice could give [place] a call. Pt daughter verbalized understanding.

## 2016-02-24 DIAGNOSIS — G308 Other Alzheimer's disease: Secondary | ICD-10-CM | POA: Diagnosis not present

## 2016-02-24 DIAGNOSIS — E784 Other hyperlipidemia: Secondary | ICD-10-CM | POA: Diagnosis not present

## 2016-02-24 DIAGNOSIS — M6281 Muscle weakness (generalized): Secondary | ICD-10-CM | POA: Diagnosis not present

## 2016-02-24 DIAGNOSIS — R1312 Dysphagia, oropharyngeal phase: Secondary | ICD-10-CM | POA: Diagnosis not present

## 2016-02-24 DIAGNOSIS — I482 Chronic atrial fibrillation: Secondary | ICD-10-CM | POA: Diagnosis not present

## 2016-02-24 DIAGNOSIS — R2681 Unsteadiness on feet: Secondary | ICD-10-CM | POA: Diagnosis not present

## 2016-02-24 DIAGNOSIS — R52 Pain, unspecified: Secondary | ICD-10-CM | POA: Diagnosis not present

## 2016-02-24 DIAGNOSIS — H4089 Other specified glaucoma: Secondary | ICD-10-CM | POA: Diagnosis not present

## 2016-02-24 DIAGNOSIS — G309 Alzheimer's disease, unspecified: Secondary | ICD-10-CM | POA: Diagnosis not present

## 2016-02-24 DIAGNOSIS — L89312 Pressure ulcer of right buttock, stage 2: Secondary | ICD-10-CM | POA: Diagnosis not present

## 2016-02-24 DIAGNOSIS — R627 Adult failure to thrive: Secondary | ICD-10-CM | POA: Diagnosis not present

## 2016-02-24 DIAGNOSIS — I4891 Unspecified atrial fibrillation: Secondary | ICD-10-CM | POA: Diagnosis not present

## 2016-02-24 DIAGNOSIS — E785 Hyperlipidemia, unspecified: Secondary | ICD-10-CM | POA: Diagnosis not present

## 2016-02-24 DIAGNOSIS — E039 Hypothyroidism, unspecified: Secondary | ICD-10-CM | POA: Diagnosis not present

## 2016-02-24 DIAGNOSIS — R451 Restlessness and agitation: Secondary | ICD-10-CM | POA: Diagnosis not present

## 2016-02-24 DIAGNOSIS — H409 Unspecified glaucoma: Secondary | ICD-10-CM | POA: Diagnosis not present

## 2016-03-03 ENCOUNTER — Encounter: Payer: Medicare Other | Admitting: Cardiovascular Disease

## 2016-03-03 ENCOUNTER — Encounter: Payer: Self-pay | Admitting: *Deleted

## 2016-04-15 DIAGNOSIS — N182 Chronic kidney disease, stage 2 (mild): Secondary | ICD-10-CM | POA: Diagnosis not present

## 2016-04-15 DIAGNOSIS — L89153 Pressure ulcer of sacral region, stage 3: Secondary | ICD-10-CM | POA: Diagnosis not present

## 2016-04-15 DIAGNOSIS — I482 Chronic atrial fibrillation: Secondary | ICD-10-CM | POA: Diagnosis not present

## 2016-04-15 DIAGNOSIS — K5909 Other constipation: Secondary | ICD-10-CM | POA: Diagnosis not present

## 2016-04-15 DIAGNOSIS — Z950 Presence of cardiac pacemaker: Secondary | ICD-10-CM | POA: Diagnosis not present

## 2016-04-15 DIAGNOSIS — R627 Adult failure to thrive: Secondary | ICD-10-CM | POA: Diagnosis not present

## 2016-04-15 DIAGNOSIS — E785 Hyperlipidemia, unspecified: Secondary | ICD-10-CM | POA: Diagnosis not present

## 2016-04-15 DIAGNOSIS — E440 Moderate protein-calorie malnutrition: Secondary | ICD-10-CM | POA: Diagnosis not present

## 2016-04-15 DIAGNOSIS — Z4800 Encounter for change or removal of nonsurgical wound dressing: Secondary | ICD-10-CM | POA: Diagnosis not present

## 2016-04-15 DIAGNOSIS — I10 Essential (primary) hypertension: Secondary | ICD-10-CM | POA: Diagnosis not present

## 2016-04-15 DIAGNOSIS — G909 Disorder of the autonomic nervous system, unspecified: Secondary | ICD-10-CM | POA: Diagnosis not present

## 2016-04-15 DIAGNOSIS — L89614 Pressure ulcer of right heel, stage 4: Secondary | ICD-10-CM | POA: Diagnosis not present

## 2016-04-15 DIAGNOSIS — E039 Hypothyroidism, unspecified: Secondary | ICD-10-CM | POA: Diagnosis not present

## 2016-04-15 DIAGNOSIS — G309 Alzheimer's disease, unspecified: Secondary | ICD-10-CM | POA: Diagnosis not present

## 2016-04-15 DIAGNOSIS — E038 Other specified hypothyroidism: Secondary | ICD-10-CM | POA: Diagnosis not present

## 2016-04-15 DIAGNOSIS — H409 Unspecified glaucoma: Secondary | ICD-10-CM | POA: Diagnosis not present

## 2016-04-19 DIAGNOSIS — G909 Disorder of the autonomic nervous system, unspecified: Secondary | ICD-10-CM | POA: Diagnosis not present

## 2016-04-19 DIAGNOSIS — I482 Chronic atrial fibrillation: Secondary | ICD-10-CM | POA: Diagnosis not present

## 2016-04-19 DIAGNOSIS — Z4800 Encounter for change or removal of nonsurgical wound dressing: Secondary | ICD-10-CM | POA: Diagnosis not present

## 2016-04-19 DIAGNOSIS — H409 Unspecified glaucoma: Secondary | ICD-10-CM | POA: Diagnosis not present

## 2016-04-19 DIAGNOSIS — Z950 Presence of cardiac pacemaker: Secondary | ICD-10-CM | POA: Diagnosis not present

## 2016-04-19 DIAGNOSIS — E785 Hyperlipidemia, unspecified: Secondary | ICD-10-CM | POA: Diagnosis not present

## 2016-04-19 DIAGNOSIS — K5909 Other constipation: Secondary | ICD-10-CM | POA: Diagnosis not present

## 2016-04-19 DIAGNOSIS — G309 Alzheimer's disease, unspecified: Secondary | ICD-10-CM | POA: Diagnosis not present

## 2016-04-19 DIAGNOSIS — R627 Adult failure to thrive: Secondary | ICD-10-CM | POA: Diagnosis not present

## 2016-04-19 DIAGNOSIS — E440 Moderate protein-calorie malnutrition: Secondary | ICD-10-CM | POA: Diagnosis not present

## 2016-04-19 DIAGNOSIS — E039 Hypothyroidism, unspecified: Secondary | ICD-10-CM | POA: Diagnosis not present

## 2016-04-19 DIAGNOSIS — L89614 Pressure ulcer of right heel, stage 4: Secondary | ICD-10-CM | POA: Diagnosis not present

## 2016-04-20 DIAGNOSIS — I2699 Other pulmonary embolism without acute cor pulmonale: Secondary | ICD-10-CM | POA: Diagnosis not present

## 2016-04-20 DIAGNOSIS — Z79899 Other long term (current) drug therapy: Secondary | ICD-10-CM | POA: Diagnosis not present

## 2016-04-20 DIAGNOSIS — R0789 Other chest pain: Secondary | ICD-10-CM | POA: Diagnosis not present

## 2016-04-20 DIAGNOSIS — R269 Unspecified abnormalities of gait and mobility: Secondary | ICD-10-CM | POA: Diagnosis not present

## 2016-04-20 DIAGNOSIS — R079 Chest pain, unspecified: Secondary | ICD-10-CM | POA: Diagnosis not present

## 2016-04-20 DIAGNOSIS — Z87891 Personal history of nicotine dependence: Secondary | ICD-10-CM | POA: Diagnosis not present

## 2016-04-20 DIAGNOSIS — I4891 Unspecified atrial fibrillation: Secondary | ICD-10-CM | POA: Diagnosis not present

## 2016-04-20 DIAGNOSIS — E039 Hypothyroidism, unspecified: Secondary | ICD-10-CM | POA: Diagnosis not present

## 2016-04-20 DIAGNOSIS — Z66 Do not resuscitate: Secondary | ICD-10-CM | POA: Diagnosis not present

## 2016-04-20 DIAGNOSIS — G20 Parkinson's disease: Secondary | ICD-10-CM | POA: Diagnosis not present

## 2016-04-20 DIAGNOSIS — Z950 Presence of cardiac pacemaker: Secondary | ICD-10-CM | POA: Diagnosis not present

## 2016-04-21 DIAGNOSIS — R627 Adult failure to thrive: Secondary | ICD-10-CM | POA: Diagnosis not present

## 2016-04-21 DIAGNOSIS — Z950 Presence of cardiac pacemaker: Secondary | ICD-10-CM | POA: Diagnosis not present

## 2016-04-21 DIAGNOSIS — E039 Hypothyroidism, unspecified: Secondary | ICD-10-CM | POA: Diagnosis not present

## 2016-04-21 DIAGNOSIS — L89614 Pressure ulcer of right heel, stage 4: Secondary | ICD-10-CM | POA: Diagnosis not present

## 2016-04-21 DIAGNOSIS — H409 Unspecified glaucoma: Secondary | ICD-10-CM | POA: Diagnosis not present

## 2016-04-21 DIAGNOSIS — E440 Moderate protein-calorie malnutrition: Secondary | ICD-10-CM | POA: Diagnosis not present

## 2016-04-21 DIAGNOSIS — G309 Alzheimer's disease, unspecified: Secondary | ICD-10-CM | POA: Diagnosis not present

## 2016-04-21 DIAGNOSIS — I482 Chronic atrial fibrillation: Secondary | ICD-10-CM | POA: Diagnosis not present

## 2016-04-21 DIAGNOSIS — Z4800 Encounter for change or removal of nonsurgical wound dressing: Secondary | ICD-10-CM | POA: Diagnosis not present

## 2016-04-21 DIAGNOSIS — L89610 Pressure ulcer of right heel, unstageable: Secondary | ICD-10-CM | POA: Diagnosis not present

## 2016-04-21 DIAGNOSIS — G909 Disorder of the autonomic nervous system, unspecified: Secondary | ICD-10-CM | POA: Diagnosis not present

## 2016-04-21 DIAGNOSIS — K5909 Other constipation: Secondary | ICD-10-CM | POA: Diagnosis not present

## 2016-04-21 DIAGNOSIS — E785 Hyperlipidemia, unspecified: Secondary | ICD-10-CM | POA: Diagnosis not present

## 2016-04-22 DIAGNOSIS — L89613 Pressure ulcer of right heel, stage 3: Secondary | ICD-10-CM | POA: Diagnosis not present

## 2016-04-22 DIAGNOSIS — I4891 Unspecified atrial fibrillation: Secondary | ICD-10-CM | POA: Diagnosis not present

## 2016-04-22 DIAGNOSIS — N182 Chronic kidney disease, stage 2 (mild): Secondary | ICD-10-CM | POA: Diagnosis not present

## 2016-04-22 DIAGNOSIS — L89610 Pressure ulcer of right heel, unstageable: Secondary | ICD-10-CM | POA: Diagnosis not present

## 2016-04-22 DIAGNOSIS — Z803 Family history of malignant neoplasm of breast: Secondary | ICD-10-CM | POA: Diagnosis not present

## 2016-04-22 DIAGNOSIS — I129 Hypertensive chronic kidney disease with stage 1 through stage 4 chronic kidney disease, or unspecified chronic kidney disease: Secondary | ICD-10-CM | POA: Diagnosis not present

## 2016-04-22 DIAGNOSIS — Z87440 Personal history of urinary (tract) infections: Secondary | ICD-10-CM | POA: Diagnosis not present

## 2016-04-22 DIAGNOSIS — E039 Hypothyroidism, unspecified: Secondary | ICD-10-CM | POA: Diagnosis not present

## 2016-04-22 DIAGNOSIS — Z950 Presence of cardiac pacemaker: Secondary | ICD-10-CM | POA: Diagnosis not present

## 2016-04-22 DIAGNOSIS — Z8249 Family history of ischemic heart disease and other diseases of the circulatory system: Secondary | ICD-10-CM | POA: Diagnosis not present

## 2016-04-22 DIAGNOSIS — Z79899 Other long term (current) drug therapy: Secondary | ICD-10-CM | POA: Diagnosis not present

## 2016-04-23 DIAGNOSIS — Z4800 Encounter for change or removal of nonsurgical wound dressing: Secondary | ICD-10-CM | POA: Diagnosis not present

## 2016-04-23 DIAGNOSIS — Z950 Presence of cardiac pacemaker: Secondary | ICD-10-CM | POA: Diagnosis not present

## 2016-04-23 DIAGNOSIS — R627 Adult failure to thrive: Secondary | ICD-10-CM | POA: Diagnosis not present

## 2016-04-23 DIAGNOSIS — I482 Chronic atrial fibrillation: Secondary | ICD-10-CM | POA: Diagnosis not present

## 2016-04-23 DIAGNOSIS — H409 Unspecified glaucoma: Secondary | ICD-10-CM | POA: Diagnosis not present

## 2016-04-23 DIAGNOSIS — L89614 Pressure ulcer of right heel, stage 4: Secondary | ICD-10-CM | POA: Diagnosis not present

## 2016-04-23 DIAGNOSIS — E440 Moderate protein-calorie malnutrition: Secondary | ICD-10-CM | POA: Diagnosis not present

## 2016-04-23 DIAGNOSIS — E039 Hypothyroidism, unspecified: Secondary | ICD-10-CM | POA: Diagnosis not present

## 2016-04-23 DIAGNOSIS — K5909 Other constipation: Secondary | ICD-10-CM | POA: Diagnosis not present

## 2016-04-23 DIAGNOSIS — G309 Alzheimer's disease, unspecified: Secondary | ICD-10-CM | POA: Diagnosis not present

## 2016-04-23 DIAGNOSIS — G909 Disorder of the autonomic nervous system, unspecified: Secondary | ICD-10-CM | POA: Diagnosis not present

## 2016-04-23 DIAGNOSIS — E785 Hyperlipidemia, unspecified: Secondary | ICD-10-CM | POA: Diagnosis not present

## 2016-04-26 DIAGNOSIS — Z950 Presence of cardiac pacemaker: Secondary | ICD-10-CM | POA: Diagnosis not present

## 2016-04-26 DIAGNOSIS — R627 Adult failure to thrive: Secondary | ICD-10-CM | POA: Diagnosis not present

## 2016-04-26 DIAGNOSIS — H409 Unspecified glaucoma: Secondary | ICD-10-CM | POA: Diagnosis not present

## 2016-04-26 DIAGNOSIS — L89614 Pressure ulcer of right heel, stage 4: Secondary | ICD-10-CM | POA: Diagnosis not present

## 2016-04-26 DIAGNOSIS — Z4800 Encounter for change or removal of nonsurgical wound dressing: Secondary | ICD-10-CM | POA: Diagnosis not present

## 2016-04-26 DIAGNOSIS — E039 Hypothyroidism, unspecified: Secondary | ICD-10-CM | POA: Diagnosis not present

## 2016-04-26 DIAGNOSIS — K5909 Other constipation: Secondary | ICD-10-CM | POA: Diagnosis not present

## 2016-04-26 DIAGNOSIS — G909 Disorder of the autonomic nervous system, unspecified: Secondary | ICD-10-CM | POA: Diagnosis not present

## 2016-04-26 DIAGNOSIS — I482 Chronic atrial fibrillation: Secondary | ICD-10-CM | POA: Diagnosis not present

## 2016-04-26 DIAGNOSIS — G309 Alzheimer's disease, unspecified: Secondary | ICD-10-CM | POA: Diagnosis not present

## 2016-04-26 DIAGNOSIS — E785 Hyperlipidemia, unspecified: Secondary | ICD-10-CM | POA: Diagnosis not present

## 2016-04-26 DIAGNOSIS — E440 Moderate protein-calorie malnutrition: Secondary | ICD-10-CM | POA: Diagnosis not present

## 2016-04-27 ENCOUNTER — Telehealth: Payer: Self-pay | Admitting: Cardiovascular Disease

## 2016-04-27 ENCOUNTER — Other Ambulatory Visit: Payer: Self-pay | Admitting: Cardiovascular Disease

## 2016-04-27 DIAGNOSIS — L89614 Pressure ulcer of right heel, stage 4: Secondary | ICD-10-CM | POA: Diagnosis not present

## 2016-04-27 DIAGNOSIS — E440 Moderate protein-calorie malnutrition: Secondary | ICD-10-CM | POA: Diagnosis not present

## 2016-04-27 DIAGNOSIS — H409 Unspecified glaucoma: Secondary | ICD-10-CM | POA: Diagnosis not present

## 2016-04-27 DIAGNOSIS — Z4800 Encounter for change or removal of nonsurgical wound dressing: Secondary | ICD-10-CM | POA: Diagnosis not present

## 2016-04-27 DIAGNOSIS — Z950 Presence of cardiac pacemaker: Secondary | ICD-10-CM | POA: Diagnosis not present

## 2016-04-27 DIAGNOSIS — E785 Hyperlipidemia, unspecified: Secondary | ICD-10-CM | POA: Diagnosis not present

## 2016-04-27 DIAGNOSIS — I482 Chronic atrial fibrillation: Secondary | ICD-10-CM | POA: Diagnosis not present

## 2016-04-27 DIAGNOSIS — E039 Hypothyroidism, unspecified: Secondary | ICD-10-CM | POA: Diagnosis not present

## 2016-04-27 DIAGNOSIS — R627 Adult failure to thrive: Secondary | ICD-10-CM | POA: Diagnosis not present

## 2016-04-27 DIAGNOSIS — G309 Alzheimer's disease, unspecified: Secondary | ICD-10-CM | POA: Diagnosis not present

## 2016-04-27 DIAGNOSIS — K5909 Other constipation: Secondary | ICD-10-CM | POA: Diagnosis not present

## 2016-04-27 DIAGNOSIS — G909 Disorder of the autonomic nervous system, unspecified: Secondary | ICD-10-CM | POA: Diagnosis not present

## 2016-04-27 MED ORDER — AMIODARONE HCL 200 MG PO TABS: 200.0000 mg | ORAL_TABLET | Freq: Every day | ORAL | 0 refills | Status: DC

## 2016-04-27 NOTE — Telephone Encounter (Signed)
Rx for amiodarone sent to [facility]--30 day supply with no refills. Pt needs appt for refills. Last seen February 2017.

## 2016-04-27 NOTE — Telephone Encounter (Signed)
Rx request sent to pharmacy.  

## 2016-04-28 DIAGNOSIS — H409 Unspecified glaucoma: Secondary | ICD-10-CM | POA: Diagnosis not present

## 2016-04-28 DIAGNOSIS — K5909 Other constipation: Secondary | ICD-10-CM | POA: Diagnosis not present

## 2016-04-28 DIAGNOSIS — E039 Hypothyroidism, unspecified: Secondary | ICD-10-CM | POA: Diagnosis not present

## 2016-04-28 DIAGNOSIS — G309 Alzheimer's disease, unspecified: Secondary | ICD-10-CM | POA: Diagnosis not present

## 2016-04-28 DIAGNOSIS — L89614 Pressure ulcer of right heel, stage 4: Secondary | ICD-10-CM | POA: Diagnosis not present

## 2016-04-28 DIAGNOSIS — Z4800 Encounter for change or removal of nonsurgical wound dressing: Secondary | ICD-10-CM | POA: Diagnosis not present

## 2016-04-28 DIAGNOSIS — E440 Moderate protein-calorie malnutrition: Secondary | ICD-10-CM | POA: Diagnosis not present

## 2016-04-28 DIAGNOSIS — R627 Adult failure to thrive: Secondary | ICD-10-CM | POA: Diagnosis not present

## 2016-04-28 DIAGNOSIS — I482 Chronic atrial fibrillation: Secondary | ICD-10-CM | POA: Diagnosis not present

## 2016-04-28 DIAGNOSIS — G909 Disorder of the autonomic nervous system, unspecified: Secondary | ICD-10-CM | POA: Diagnosis not present

## 2016-04-28 DIAGNOSIS — E785 Hyperlipidemia, unspecified: Secondary | ICD-10-CM | POA: Diagnosis not present

## 2016-04-28 DIAGNOSIS — Z950 Presence of cardiac pacemaker: Secondary | ICD-10-CM | POA: Diagnosis not present

## 2016-04-29 DIAGNOSIS — E785 Hyperlipidemia, unspecified: Secondary | ICD-10-CM | POA: Diagnosis not present

## 2016-04-29 DIAGNOSIS — H409 Unspecified glaucoma: Secondary | ICD-10-CM | POA: Diagnosis not present

## 2016-04-29 DIAGNOSIS — K5909 Other constipation: Secondary | ICD-10-CM | POA: Diagnosis not present

## 2016-04-29 DIAGNOSIS — E039 Hypothyroidism, unspecified: Secondary | ICD-10-CM | POA: Diagnosis not present

## 2016-04-29 DIAGNOSIS — G309 Alzheimer's disease, unspecified: Secondary | ICD-10-CM | POA: Diagnosis not present

## 2016-04-29 DIAGNOSIS — E440 Moderate protein-calorie malnutrition: Secondary | ICD-10-CM | POA: Diagnosis not present

## 2016-04-29 DIAGNOSIS — R627 Adult failure to thrive: Secondary | ICD-10-CM | POA: Diagnosis not present

## 2016-04-29 DIAGNOSIS — L89614 Pressure ulcer of right heel, stage 4: Secondary | ICD-10-CM | POA: Diagnosis not present

## 2016-04-29 DIAGNOSIS — G909 Disorder of the autonomic nervous system, unspecified: Secondary | ICD-10-CM | POA: Diagnosis not present

## 2016-04-29 DIAGNOSIS — Z950 Presence of cardiac pacemaker: Secondary | ICD-10-CM | POA: Diagnosis not present

## 2016-04-29 DIAGNOSIS — Z4800 Encounter for change or removal of nonsurgical wound dressing: Secondary | ICD-10-CM | POA: Diagnosis not present

## 2016-04-29 DIAGNOSIS — I482 Chronic atrial fibrillation: Secondary | ICD-10-CM | POA: Diagnosis not present

## 2016-04-30 DIAGNOSIS — G909 Disorder of the autonomic nervous system, unspecified: Secondary | ICD-10-CM | POA: Diagnosis not present

## 2016-04-30 DIAGNOSIS — E039 Hypothyroidism, unspecified: Secondary | ICD-10-CM | POA: Diagnosis not present

## 2016-04-30 DIAGNOSIS — Z4800 Encounter for change or removal of nonsurgical wound dressing: Secondary | ICD-10-CM | POA: Diagnosis not present

## 2016-04-30 DIAGNOSIS — H409 Unspecified glaucoma: Secondary | ICD-10-CM | POA: Diagnosis not present

## 2016-04-30 DIAGNOSIS — I482 Chronic atrial fibrillation: Secondary | ICD-10-CM | POA: Diagnosis not present

## 2016-04-30 DIAGNOSIS — E785 Hyperlipidemia, unspecified: Secondary | ICD-10-CM | POA: Diagnosis not present

## 2016-04-30 DIAGNOSIS — R627 Adult failure to thrive: Secondary | ICD-10-CM | POA: Diagnosis not present

## 2016-04-30 DIAGNOSIS — E440 Moderate protein-calorie malnutrition: Secondary | ICD-10-CM | POA: Diagnosis not present

## 2016-04-30 DIAGNOSIS — K5909 Other constipation: Secondary | ICD-10-CM | POA: Diagnosis not present

## 2016-04-30 DIAGNOSIS — G309 Alzheimer's disease, unspecified: Secondary | ICD-10-CM | POA: Diagnosis not present

## 2016-04-30 DIAGNOSIS — L89614 Pressure ulcer of right heel, stage 4: Secondary | ICD-10-CM | POA: Diagnosis not present

## 2016-04-30 DIAGNOSIS — Z950 Presence of cardiac pacemaker: Secondary | ICD-10-CM | POA: Diagnosis not present

## 2016-05-03 DIAGNOSIS — G309 Alzheimer's disease, unspecified: Secondary | ICD-10-CM | POA: Diagnosis not present

## 2016-05-03 DIAGNOSIS — L89614 Pressure ulcer of right heel, stage 4: Secondary | ICD-10-CM | POA: Diagnosis not present

## 2016-05-03 DIAGNOSIS — R627 Adult failure to thrive: Secondary | ICD-10-CM | POA: Diagnosis not present

## 2016-05-03 DIAGNOSIS — G909 Disorder of the autonomic nervous system, unspecified: Secondary | ICD-10-CM | POA: Diagnosis not present

## 2016-05-03 DIAGNOSIS — E440 Moderate protein-calorie malnutrition: Secondary | ICD-10-CM | POA: Diagnosis not present

## 2016-05-03 DIAGNOSIS — Z4800 Encounter for change or removal of nonsurgical wound dressing: Secondary | ICD-10-CM | POA: Diagnosis not present

## 2016-05-03 DIAGNOSIS — E785 Hyperlipidemia, unspecified: Secondary | ICD-10-CM | POA: Diagnosis not present

## 2016-05-03 DIAGNOSIS — Z950 Presence of cardiac pacemaker: Secondary | ICD-10-CM | POA: Diagnosis not present

## 2016-05-03 DIAGNOSIS — E039 Hypothyroidism, unspecified: Secondary | ICD-10-CM | POA: Diagnosis not present

## 2016-05-03 DIAGNOSIS — I482 Chronic atrial fibrillation: Secondary | ICD-10-CM | POA: Diagnosis not present

## 2016-05-03 DIAGNOSIS — K5909 Other constipation: Secondary | ICD-10-CM | POA: Diagnosis not present

## 2016-05-03 DIAGNOSIS — H409 Unspecified glaucoma: Secondary | ICD-10-CM | POA: Diagnosis not present

## 2016-05-05 DIAGNOSIS — Z950 Presence of cardiac pacemaker: Secondary | ICD-10-CM | POA: Diagnosis not present

## 2016-05-05 DIAGNOSIS — E440 Moderate protein-calorie malnutrition: Secondary | ICD-10-CM | POA: Diagnosis not present

## 2016-05-05 DIAGNOSIS — G909 Disorder of the autonomic nervous system, unspecified: Secondary | ICD-10-CM | POA: Diagnosis not present

## 2016-05-05 DIAGNOSIS — E785 Hyperlipidemia, unspecified: Secondary | ICD-10-CM | POA: Diagnosis not present

## 2016-05-05 DIAGNOSIS — R627 Adult failure to thrive: Secondary | ICD-10-CM | POA: Diagnosis not present

## 2016-05-05 DIAGNOSIS — L89614 Pressure ulcer of right heel, stage 4: Secondary | ICD-10-CM | POA: Diagnosis not present

## 2016-05-05 DIAGNOSIS — Z4800 Encounter for change or removal of nonsurgical wound dressing: Secondary | ICD-10-CM | POA: Diagnosis not present

## 2016-05-05 DIAGNOSIS — H409 Unspecified glaucoma: Secondary | ICD-10-CM | POA: Diagnosis not present

## 2016-05-05 DIAGNOSIS — I482 Chronic atrial fibrillation: Secondary | ICD-10-CM | POA: Diagnosis not present

## 2016-05-05 DIAGNOSIS — K5909 Other constipation: Secondary | ICD-10-CM | POA: Diagnosis not present

## 2016-05-05 DIAGNOSIS — E039 Hypothyroidism, unspecified: Secondary | ICD-10-CM | POA: Diagnosis not present

## 2016-05-05 DIAGNOSIS — G309 Alzheimer's disease, unspecified: Secondary | ICD-10-CM | POA: Diagnosis not present

## 2016-05-07 DIAGNOSIS — G309 Alzheimer's disease, unspecified: Secondary | ICD-10-CM | POA: Diagnosis not present

## 2016-05-07 DIAGNOSIS — K5909 Other constipation: Secondary | ICD-10-CM | POA: Diagnosis not present

## 2016-05-07 DIAGNOSIS — Z950 Presence of cardiac pacemaker: Secondary | ICD-10-CM | POA: Diagnosis not present

## 2016-05-07 DIAGNOSIS — G909 Disorder of the autonomic nervous system, unspecified: Secondary | ICD-10-CM | POA: Diagnosis not present

## 2016-05-07 DIAGNOSIS — E440 Moderate protein-calorie malnutrition: Secondary | ICD-10-CM | POA: Diagnosis not present

## 2016-05-07 DIAGNOSIS — I482 Chronic atrial fibrillation: Secondary | ICD-10-CM | POA: Diagnosis not present

## 2016-05-07 DIAGNOSIS — H409 Unspecified glaucoma: Secondary | ICD-10-CM | POA: Diagnosis not present

## 2016-05-07 DIAGNOSIS — Z4800 Encounter for change or removal of nonsurgical wound dressing: Secondary | ICD-10-CM | POA: Diagnosis not present

## 2016-05-07 DIAGNOSIS — R627 Adult failure to thrive: Secondary | ICD-10-CM | POA: Diagnosis not present

## 2016-05-07 DIAGNOSIS — E039 Hypothyroidism, unspecified: Secondary | ICD-10-CM | POA: Diagnosis not present

## 2016-05-07 DIAGNOSIS — L89614 Pressure ulcer of right heel, stage 4: Secondary | ICD-10-CM | POA: Diagnosis not present

## 2016-05-07 DIAGNOSIS — E785 Hyperlipidemia, unspecified: Secondary | ICD-10-CM | POA: Diagnosis not present

## 2016-05-10 DIAGNOSIS — Z950 Presence of cardiac pacemaker: Secondary | ICD-10-CM | POA: Diagnosis not present

## 2016-05-10 DIAGNOSIS — H409 Unspecified glaucoma: Secondary | ICD-10-CM | POA: Diagnosis not present

## 2016-05-10 DIAGNOSIS — G909 Disorder of the autonomic nervous system, unspecified: Secondary | ICD-10-CM | POA: Diagnosis not present

## 2016-05-10 DIAGNOSIS — I482 Chronic atrial fibrillation: Secondary | ICD-10-CM | POA: Diagnosis not present

## 2016-05-10 DIAGNOSIS — E440 Moderate protein-calorie malnutrition: Secondary | ICD-10-CM | POA: Diagnosis not present

## 2016-05-10 DIAGNOSIS — E039 Hypothyroidism, unspecified: Secondary | ICD-10-CM | POA: Diagnosis not present

## 2016-05-10 DIAGNOSIS — Z4800 Encounter for change or removal of nonsurgical wound dressing: Secondary | ICD-10-CM | POA: Diagnosis not present

## 2016-05-10 DIAGNOSIS — K5909 Other constipation: Secondary | ICD-10-CM | POA: Diagnosis not present

## 2016-05-10 DIAGNOSIS — R627 Adult failure to thrive: Secondary | ICD-10-CM | POA: Diagnosis not present

## 2016-05-10 DIAGNOSIS — L89614 Pressure ulcer of right heel, stage 4: Secondary | ICD-10-CM | POA: Diagnosis not present

## 2016-05-10 DIAGNOSIS — E785 Hyperlipidemia, unspecified: Secondary | ICD-10-CM | POA: Diagnosis not present

## 2016-05-10 DIAGNOSIS — G309 Alzheimer's disease, unspecified: Secondary | ICD-10-CM | POA: Diagnosis not present

## 2016-05-13 DIAGNOSIS — E440 Moderate protein-calorie malnutrition: Secondary | ICD-10-CM | POA: Diagnosis not present

## 2016-05-13 DIAGNOSIS — Z4800 Encounter for change or removal of nonsurgical wound dressing: Secondary | ICD-10-CM | POA: Diagnosis not present

## 2016-05-13 DIAGNOSIS — E785 Hyperlipidemia, unspecified: Secondary | ICD-10-CM | POA: Diagnosis not present

## 2016-05-13 DIAGNOSIS — K5909 Other constipation: Secondary | ICD-10-CM | POA: Diagnosis not present

## 2016-05-13 DIAGNOSIS — G309 Alzheimer's disease, unspecified: Secondary | ICD-10-CM | POA: Diagnosis not present

## 2016-05-13 DIAGNOSIS — L89614 Pressure ulcer of right heel, stage 4: Secondary | ICD-10-CM | POA: Diagnosis not present

## 2016-05-13 DIAGNOSIS — I482 Chronic atrial fibrillation: Secondary | ICD-10-CM | POA: Diagnosis not present

## 2016-05-13 DIAGNOSIS — H409 Unspecified glaucoma: Secondary | ICD-10-CM | POA: Diagnosis not present

## 2016-05-13 DIAGNOSIS — E039 Hypothyroidism, unspecified: Secondary | ICD-10-CM | POA: Diagnosis not present

## 2016-05-13 DIAGNOSIS — Z950 Presence of cardiac pacemaker: Secondary | ICD-10-CM | POA: Diagnosis not present

## 2016-05-13 DIAGNOSIS — R627 Adult failure to thrive: Secondary | ICD-10-CM | POA: Diagnosis not present

## 2016-05-13 DIAGNOSIS — G909 Disorder of the autonomic nervous system, unspecified: Secondary | ICD-10-CM | POA: Diagnosis not present

## 2016-05-14 DIAGNOSIS — I482 Chronic atrial fibrillation: Secondary | ICD-10-CM | POA: Diagnosis not present

## 2016-05-14 DIAGNOSIS — Z4800 Encounter for change or removal of nonsurgical wound dressing: Secondary | ICD-10-CM | POA: Diagnosis not present

## 2016-05-14 DIAGNOSIS — G309 Alzheimer's disease, unspecified: Secondary | ICD-10-CM | POA: Diagnosis not present

## 2016-05-14 DIAGNOSIS — L89614 Pressure ulcer of right heel, stage 4: Secondary | ICD-10-CM | POA: Diagnosis not present

## 2016-05-14 DIAGNOSIS — E785 Hyperlipidemia, unspecified: Secondary | ICD-10-CM | POA: Diagnosis not present

## 2016-05-14 DIAGNOSIS — G909 Disorder of the autonomic nervous system, unspecified: Secondary | ICD-10-CM | POA: Diagnosis not present

## 2016-05-14 DIAGNOSIS — E440 Moderate protein-calorie malnutrition: Secondary | ICD-10-CM | POA: Diagnosis not present

## 2016-05-14 DIAGNOSIS — Z950 Presence of cardiac pacemaker: Secondary | ICD-10-CM | POA: Diagnosis not present

## 2016-05-14 DIAGNOSIS — R627 Adult failure to thrive: Secondary | ICD-10-CM | POA: Diagnosis not present

## 2016-05-14 DIAGNOSIS — H409 Unspecified glaucoma: Secondary | ICD-10-CM | POA: Diagnosis not present

## 2016-05-14 DIAGNOSIS — K5909 Other constipation: Secondary | ICD-10-CM | POA: Diagnosis not present

## 2016-05-14 DIAGNOSIS — E039 Hypothyroidism, unspecified: Secondary | ICD-10-CM | POA: Diagnosis not present

## 2016-05-15 DIAGNOSIS — G909 Disorder of the autonomic nervous system, unspecified: Secondary | ICD-10-CM | POA: Diagnosis not present

## 2016-05-15 DIAGNOSIS — I10 Essential (primary) hypertension: Secondary | ICD-10-CM | POA: Diagnosis not present

## 2016-05-17 DIAGNOSIS — E440 Moderate protein-calorie malnutrition: Secondary | ICD-10-CM | POA: Diagnosis not present

## 2016-05-17 DIAGNOSIS — R627 Adult failure to thrive: Secondary | ICD-10-CM | POA: Diagnosis not present

## 2016-05-17 DIAGNOSIS — Z950 Presence of cardiac pacemaker: Secondary | ICD-10-CM | POA: Diagnosis not present

## 2016-05-17 DIAGNOSIS — G909 Disorder of the autonomic nervous system, unspecified: Secondary | ICD-10-CM | POA: Diagnosis not present

## 2016-05-17 DIAGNOSIS — K5909 Other constipation: Secondary | ICD-10-CM | POA: Diagnosis not present

## 2016-05-17 DIAGNOSIS — E039 Hypothyroidism, unspecified: Secondary | ICD-10-CM | POA: Diagnosis not present

## 2016-05-17 DIAGNOSIS — H409 Unspecified glaucoma: Secondary | ICD-10-CM | POA: Diagnosis not present

## 2016-05-17 DIAGNOSIS — L89614 Pressure ulcer of right heel, stage 4: Secondary | ICD-10-CM | POA: Diagnosis not present

## 2016-05-17 DIAGNOSIS — I482 Chronic atrial fibrillation: Secondary | ICD-10-CM | POA: Diagnosis not present

## 2016-05-17 DIAGNOSIS — E785 Hyperlipidemia, unspecified: Secondary | ICD-10-CM | POA: Diagnosis not present

## 2016-05-17 DIAGNOSIS — Z4800 Encounter for change or removal of nonsurgical wound dressing: Secondary | ICD-10-CM | POA: Diagnosis not present

## 2016-05-17 DIAGNOSIS — G309 Alzheimer's disease, unspecified: Secondary | ICD-10-CM | POA: Diagnosis not present

## 2016-05-19 DIAGNOSIS — G309 Alzheimer's disease, unspecified: Secondary | ICD-10-CM | POA: Diagnosis not present

## 2016-05-19 DIAGNOSIS — K5909 Other constipation: Secondary | ICD-10-CM | POA: Diagnosis not present

## 2016-05-19 DIAGNOSIS — E039 Hypothyroidism, unspecified: Secondary | ICD-10-CM | POA: Diagnosis not present

## 2016-05-19 DIAGNOSIS — L89614 Pressure ulcer of right heel, stage 4: Secondary | ICD-10-CM | POA: Diagnosis not present

## 2016-05-19 DIAGNOSIS — G909 Disorder of the autonomic nervous system, unspecified: Secondary | ICD-10-CM | POA: Diagnosis not present

## 2016-05-19 DIAGNOSIS — E785 Hyperlipidemia, unspecified: Secondary | ICD-10-CM | POA: Diagnosis not present

## 2016-05-19 DIAGNOSIS — E440 Moderate protein-calorie malnutrition: Secondary | ICD-10-CM | POA: Diagnosis not present

## 2016-05-19 DIAGNOSIS — H409 Unspecified glaucoma: Secondary | ICD-10-CM | POA: Diagnosis not present

## 2016-05-19 DIAGNOSIS — R627 Adult failure to thrive: Secondary | ICD-10-CM | POA: Diagnosis not present

## 2016-05-19 DIAGNOSIS — Z950 Presence of cardiac pacemaker: Secondary | ICD-10-CM | POA: Diagnosis not present

## 2016-05-19 DIAGNOSIS — I482 Chronic atrial fibrillation: Secondary | ICD-10-CM | POA: Diagnosis not present

## 2016-05-19 DIAGNOSIS — Z4800 Encounter for change or removal of nonsurgical wound dressing: Secondary | ICD-10-CM | POA: Diagnosis not present

## 2016-05-20 DIAGNOSIS — L89613 Pressure ulcer of right heel, stage 3: Secondary | ICD-10-CM | POA: Diagnosis not present

## 2016-05-24 DIAGNOSIS — Z950 Presence of cardiac pacemaker: Secondary | ICD-10-CM | POA: Diagnosis not present

## 2016-05-24 DIAGNOSIS — G309 Alzheimer's disease, unspecified: Secondary | ICD-10-CM | POA: Diagnosis not present

## 2016-05-24 DIAGNOSIS — I482 Chronic atrial fibrillation: Secondary | ICD-10-CM | POA: Diagnosis not present

## 2016-05-24 DIAGNOSIS — H409 Unspecified glaucoma: Secondary | ICD-10-CM | POA: Diagnosis not present

## 2016-05-24 DIAGNOSIS — E440 Moderate protein-calorie malnutrition: Secondary | ICD-10-CM | POA: Diagnosis not present

## 2016-05-24 DIAGNOSIS — E039 Hypothyroidism, unspecified: Secondary | ICD-10-CM | POA: Diagnosis not present

## 2016-05-24 DIAGNOSIS — K5909 Other constipation: Secondary | ICD-10-CM | POA: Diagnosis not present

## 2016-05-24 DIAGNOSIS — Z4800 Encounter for change or removal of nonsurgical wound dressing: Secondary | ICD-10-CM | POA: Diagnosis not present

## 2016-05-24 DIAGNOSIS — L89614 Pressure ulcer of right heel, stage 4: Secondary | ICD-10-CM | POA: Diagnosis not present

## 2016-05-24 DIAGNOSIS — G909 Disorder of the autonomic nervous system, unspecified: Secondary | ICD-10-CM | POA: Diagnosis not present

## 2016-05-24 DIAGNOSIS — R627 Adult failure to thrive: Secondary | ICD-10-CM | POA: Diagnosis not present

## 2016-05-24 DIAGNOSIS — E785 Hyperlipidemia, unspecified: Secondary | ICD-10-CM | POA: Diagnosis not present

## 2016-05-26 DIAGNOSIS — L89614 Pressure ulcer of right heel, stage 4: Secondary | ICD-10-CM | POA: Diagnosis not present

## 2016-05-26 DIAGNOSIS — H409 Unspecified glaucoma: Secondary | ICD-10-CM | POA: Diagnosis not present

## 2016-05-26 DIAGNOSIS — Z950 Presence of cardiac pacemaker: Secondary | ICD-10-CM | POA: Diagnosis not present

## 2016-05-26 DIAGNOSIS — R627 Adult failure to thrive: Secondary | ICD-10-CM | POA: Diagnosis not present

## 2016-05-26 DIAGNOSIS — E785 Hyperlipidemia, unspecified: Secondary | ICD-10-CM | POA: Diagnosis not present

## 2016-05-26 DIAGNOSIS — I482 Chronic atrial fibrillation: Secondary | ICD-10-CM | POA: Diagnosis not present

## 2016-05-26 DIAGNOSIS — G309 Alzheimer's disease, unspecified: Secondary | ICD-10-CM | POA: Diagnosis not present

## 2016-05-26 DIAGNOSIS — Z4800 Encounter for change or removal of nonsurgical wound dressing: Secondary | ICD-10-CM | POA: Diagnosis not present

## 2016-05-26 DIAGNOSIS — E440 Moderate protein-calorie malnutrition: Secondary | ICD-10-CM | POA: Diagnosis not present

## 2016-05-26 DIAGNOSIS — E039 Hypothyroidism, unspecified: Secondary | ICD-10-CM | POA: Diagnosis not present

## 2016-05-26 DIAGNOSIS — K5909 Other constipation: Secondary | ICD-10-CM | POA: Diagnosis not present

## 2016-05-26 DIAGNOSIS — G909 Disorder of the autonomic nervous system, unspecified: Secondary | ICD-10-CM | POA: Diagnosis not present

## 2016-05-28 ENCOUNTER — Other Ambulatory Visit: Payer: Self-pay | Admitting: Cardiovascular Disease

## 2016-05-28 DIAGNOSIS — L89614 Pressure ulcer of right heel, stage 4: Secondary | ICD-10-CM | POA: Diagnosis not present

## 2016-05-28 DIAGNOSIS — E039 Hypothyroidism, unspecified: Secondary | ICD-10-CM | POA: Diagnosis not present

## 2016-05-28 DIAGNOSIS — R627 Adult failure to thrive: Secondary | ICD-10-CM | POA: Diagnosis not present

## 2016-05-28 DIAGNOSIS — K5909 Other constipation: Secondary | ICD-10-CM | POA: Diagnosis not present

## 2016-05-28 DIAGNOSIS — I482 Chronic atrial fibrillation: Secondary | ICD-10-CM | POA: Diagnosis not present

## 2016-05-28 DIAGNOSIS — H409 Unspecified glaucoma: Secondary | ICD-10-CM | POA: Diagnosis not present

## 2016-05-28 DIAGNOSIS — Z4800 Encounter for change or removal of nonsurgical wound dressing: Secondary | ICD-10-CM | POA: Diagnosis not present

## 2016-05-28 DIAGNOSIS — G909 Disorder of the autonomic nervous system, unspecified: Secondary | ICD-10-CM | POA: Diagnosis not present

## 2016-05-28 DIAGNOSIS — Z950 Presence of cardiac pacemaker: Secondary | ICD-10-CM | POA: Diagnosis not present

## 2016-05-28 DIAGNOSIS — E785 Hyperlipidemia, unspecified: Secondary | ICD-10-CM | POA: Diagnosis not present

## 2016-05-28 DIAGNOSIS — G309 Alzheimer's disease, unspecified: Secondary | ICD-10-CM | POA: Diagnosis not present

## 2016-05-28 DIAGNOSIS — E440 Moderate protein-calorie malnutrition: Secondary | ICD-10-CM | POA: Diagnosis not present

## 2016-05-28 NOTE — Telephone Encounter (Signed)
Rx has been sent to the pharmacy electronically. ° °

## 2016-05-31 DIAGNOSIS — H409 Unspecified glaucoma: Secondary | ICD-10-CM | POA: Diagnosis not present

## 2016-05-31 DIAGNOSIS — K5909 Other constipation: Secondary | ICD-10-CM | POA: Diagnosis not present

## 2016-05-31 DIAGNOSIS — E785 Hyperlipidemia, unspecified: Secondary | ICD-10-CM | POA: Diagnosis not present

## 2016-05-31 DIAGNOSIS — I482 Chronic atrial fibrillation: Secondary | ICD-10-CM | POA: Diagnosis not present

## 2016-05-31 DIAGNOSIS — R627 Adult failure to thrive: Secondary | ICD-10-CM | POA: Diagnosis not present

## 2016-05-31 DIAGNOSIS — E440 Moderate protein-calorie malnutrition: Secondary | ICD-10-CM | POA: Diagnosis not present

## 2016-05-31 DIAGNOSIS — E039 Hypothyroidism, unspecified: Secondary | ICD-10-CM | POA: Diagnosis not present

## 2016-05-31 DIAGNOSIS — G309 Alzheimer's disease, unspecified: Secondary | ICD-10-CM | POA: Diagnosis not present

## 2016-05-31 DIAGNOSIS — Z4800 Encounter for change or removal of nonsurgical wound dressing: Secondary | ICD-10-CM | POA: Diagnosis not present

## 2016-05-31 DIAGNOSIS — G909 Disorder of the autonomic nervous system, unspecified: Secondary | ICD-10-CM | POA: Diagnosis not present

## 2016-05-31 DIAGNOSIS — L89614 Pressure ulcer of right heel, stage 4: Secondary | ICD-10-CM | POA: Diagnosis not present

## 2016-05-31 DIAGNOSIS — Z950 Presence of cardiac pacemaker: Secondary | ICD-10-CM | POA: Diagnosis not present

## 2016-06-02 DIAGNOSIS — Z950 Presence of cardiac pacemaker: Secondary | ICD-10-CM | POA: Diagnosis not present

## 2016-06-02 DIAGNOSIS — I482 Chronic atrial fibrillation: Secondary | ICD-10-CM | POA: Diagnosis not present

## 2016-06-02 DIAGNOSIS — R627 Adult failure to thrive: Secondary | ICD-10-CM | POA: Diagnosis not present

## 2016-06-02 DIAGNOSIS — K5909 Other constipation: Secondary | ICD-10-CM | POA: Diagnosis not present

## 2016-06-02 DIAGNOSIS — G909 Disorder of the autonomic nervous system, unspecified: Secondary | ICD-10-CM | POA: Diagnosis not present

## 2016-06-02 DIAGNOSIS — Z4800 Encounter for change or removal of nonsurgical wound dressing: Secondary | ICD-10-CM | POA: Diagnosis not present

## 2016-06-02 DIAGNOSIS — H409 Unspecified glaucoma: Secondary | ICD-10-CM | POA: Diagnosis not present

## 2016-06-02 DIAGNOSIS — E785 Hyperlipidemia, unspecified: Secondary | ICD-10-CM | POA: Diagnosis not present

## 2016-06-02 DIAGNOSIS — E440 Moderate protein-calorie malnutrition: Secondary | ICD-10-CM | POA: Diagnosis not present

## 2016-06-02 DIAGNOSIS — L89614 Pressure ulcer of right heel, stage 4: Secondary | ICD-10-CM | POA: Diagnosis not present

## 2016-06-02 DIAGNOSIS — E039 Hypothyroidism, unspecified: Secondary | ICD-10-CM | POA: Diagnosis not present

## 2016-06-02 DIAGNOSIS — G309 Alzheimer's disease, unspecified: Secondary | ICD-10-CM | POA: Diagnosis not present

## 2016-06-04 DIAGNOSIS — H409 Unspecified glaucoma: Secondary | ICD-10-CM | POA: Diagnosis not present

## 2016-06-04 DIAGNOSIS — G309 Alzheimer's disease, unspecified: Secondary | ICD-10-CM | POA: Diagnosis not present

## 2016-06-04 DIAGNOSIS — E785 Hyperlipidemia, unspecified: Secondary | ICD-10-CM | POA: Diagnosis not present

## 2016-06-04 DIAGNOSIS — Z950 Presence of cardiac pacemaker: Secondary | ICD-10-CM | POA: Diagnosis not present

## 2016-06-04 DIAGNOSIS — E039 Hypothyroidism, unspecified: Secondary | ICD-10-CM | POA: Diagnosis not present

## 2016-06-04 DIAGNOSIS — L89614 Pressure ulcer of right heel, stage 4: Secondary | ICD-10-CM | POA: Diagnosis not present

## 2016-06-04 DIAGNOSIS — R627 Adult failure to thrive: Secondary | ICD-10-CM | POA: Diagnosis not present

## 2016-06-04 DIAGNOSIS — E440 Moderate protein-calorie malnutrition: Secondary | ICD-10-CM | POA: Diagnosis not present

## 2016-06-04 DIAGNOSIS — G909 Disorder of the autonomic nervous system, unspecified: Secondary | ICD-10-CM | POA: Diagnosis not present

## 2016-06-04 DIAGNOSIS — I482 Chronic atrial fibrillation: Secondary | ICD-10-CM | POA: Diagnosis not present

## 2016-06-04 DIAGNOSIS — K5909 Other constipation: Secondary | ICD-10-CM | POA: Diagnosis not present

## 2016-06-04 DIAGNOSIS — Z4800 Encounter for change or removal of nonsurgical wound dressing: Secondary | ICD-10-CM | POA: Diagnosis not present

## 2016-06-07 DIAGNOSIS — L89614 Pressure ulcer of right heel, stage 4: Secondary | ICD-10-CM | POA: Diagnosis not present

## 2016-06-07 DIAGNOSIS — H409 Unspecified glaucoma: Secondary | ICD-10-CM | POA: Diagnosis not present

## 2016-06-07 DIAGNOSIS — I482 Chronic atrial fibrillation: Secondary | ICD-10-CM | POA: Diagnosis not present

## 2016-06-07 DIAGNOSIS — E785 Hyperlipidemia, unspecified: Secondary | ICD-10-CM | POA: Diagnosis not present

## 2016-06-07 DIAGNOSIS — E039 Hypothyroidism, unspecified: Secondary | ICD-10-CM | POA: Diagnosis not present

## 2016-06-07 DIAGNOSIS — Z4800 Encounter for change or removal of nonsurgical wound dressing: Secondary | ICD-10-CM | POA: Diagnosis not present

## 2016-06-07 DIAGNOSIS — K5909 Other constipation: Secondary | ICD-10-CM | POA: Diagnosis not present

## 2016-06-07 DIAGNOSIS — Z950 Presence of cardiac pacemaker: Secondary | ICD-10-CM | POA: Diagnosis not present

## 2016-06-07 DIAGNOSIS — E440 Moderate protein-calorie malnutrition: Secondary | ICD-10-CM | POA: Diagnosis not present

## 2016-06-07 DIAGNOSIS — G909 Disorder of the autonomic nervous system, unspecified: Secondary | ICD-10-CM | POA: Diagnosis not present

## 2016-06-07 DIAGNOSIS — R627 Adult failure to thrive: Secondary | ICD-10-CM | POA: Diagnosis not present

## 2016-06-07 DIAGNOSIS — G309 Alzheimer's disease, unspecified: Secondary | ICD-10-CM | POA: Diagnosis not present

## 2016-06-09 DIAGNOSIS — Z4800 Encounter for change or removal of nonsurgical wound dressing: Secondary | ICD-10-CM | POA: Diagnosis not present

## 2016-06-09 DIAGNOSIS — L89614 Pressure ulcer of right heel, stage 4: Secondary | ICD-10-CM | POA: Diagnosis not present

## 2016-06-09 DIAGNOSIS — E785 Hyperlipidemia, unspecified: Secondary | ICD-10-CM | POA: Diagnosis not present

## 2016-06-09 DIAGNOSIS — K5909 Other constipation: Secondary | ICD-10-CM | POA: Diagnosis not present

## 2016-06-09 DIAGNOSIS — G309 Alzheimer's disease, unspecified: Secondary | ICD-10-CM | POA: Diagnosis not present

## 2016-06-09 DIAGNOSIS — H409 Unspecified glaucoma: Secondary | ICD-10-CM | POA: Diagnosis not present

## 2016-06-09 DIAGNOSIS — E440 Moderate protein-calorie malnutrition: Secondary | ICD-10-CM | POA: Diagnosis not present

## 2016-06-09 DIAGNOSIS — R627 Adult failure to thrive: Secondary | ICD-10-CM | POA: Diagnosis not present

## 2016-06-09 DIAGNOSIS — E039 Hypothyroidism, unspecified: Secondary | ICD-10-CM | POA: Diagnosis not present

## 2016-06-09 DIAGNOSIS — L89613 Pressure ulcer of right heel, stage 3: Secondary | ICD-10-CM | POA: Diagnosis not present

## 2016-06-09 DIAGNOSIS — G909 Disorder of the autonomic nervous system, unspecified: Secondary | ICD-10-CM | POA: Diagnosis not present

## 2016-06-09 DIAGNOSIS — Z950 Presence of cardiac pacemaker: Secondary | ICD-10-CM | POA: Diagnosis not present

## 2016-06-09 DIAGNOSIS — I482 Chronic atrial fibrillation: Secondary | ICD-10-CM | POA: Diagnosis not present

## 2016-06-11 DIAGNOSIS — G909 Disorder of the autonomic nervous system, unspecified: Secondary | ICD-10-CM | POA: Diagnosis not present

## 2016-06-11 DIAGNOSIS — Z4800 Encounter for change or removal of nonsurgical wound dressing: Secondary | ICD-10-CM | POA: Diagnosis not present

## 2016-06-11 DIAGNOSIS — L89614 Pressure ulcer of right heel, stage 4: Secondary | ICD-10-CM | POA: Diagnosis not present

## 2016-06-11 DIAGNOSIS — R627 Adult failure to thrive: Secondary | ICD-10-CM | POA: Diagnosis not present

## 2016-06-11 DIAGNOSIS — E785 Hyperlipidemia, unspecified: Secondary | ICD-10-CM | POA: Diagnosis not present

## 2016-06-11 DIAGNOSIS — E039 Hypothyroidism, unspecified: Secondary | ICD-10-CM | POA: Diagnosis not present

## 2016-06-11 DIAGNOSIS — Z950 Presence of cardiac pacemaker: Secondary | ICD-10-CM | POA: Diagnosis not present

## 2016-06-11 DIAGNOSIS — G309 Alzheimer's disease, unspecified: Secondary | ICD-10-CM | POA: Diagnosis not present

## 2016-06-11 DIAGNOSIS — H409 Unspecified glaucoma: Secondary | ICD-10-CM | POA: Diagnosis not present

## 2016-06-11 DIAGNOSIS — E440 Moderate protein-calorie malnutrition: Secondary | ICD-10-CM | POA: Diagnosis not present

## 2016-06-11 DIAGNOSIS — K5909 Other constipation: Secondary | ICD-10-CM | POA: Diagnosis not present

## 2016-06-11 DIAGNOSIS — I482 Chronic atrial fibrillation: Secondary | ICD-10-CM | POA: Diagnosis not present

## 2016-06-14 DIAGNOSIS — L89614 Pressure ulcer of right heel, stage 4: Secondary | ICD-10-CM | POA: Diagnosis not present

## 2016-06-14 DIAGNOSIS — G309 Alzheimer's disease, unspecified: Secondary | ICD-10-CM | POA: Diagnosis not present

## 2016-06-14 DIAGNOSIS — E440 Moderate protein-calorie malnutrition: Secondary | ICD-10-CM | POA: Diagnosis not present

## 2016-06-14 DIAGNOSIS — E039 Hypothyroidism, unspecified: Secondary | ICD-10-CM | POA: Diagnosis not present

## 2016-06-14 DIAGNOSIS — H409 Unspecified glaucoma: Secondary | ICD-10-CM | POA: Diagnosis not present

## 2016-06-14 DIAGNOSIS — G909 Disorder of the autonomic nervous system, unspecified: Secondary | ICD-10-CM | POA: Diagnosis not present

## 2016-06-14 DIAGNOSIS — Z950 Presence of cardiac pacemaker: Secondary | ICD-10-CM | POA: Diagnosis not present

## 2016-06-14 DIAGNOSIS — Z4800 Encounter for change or removal of nonsurgical wound dressing: Secondary | ICD-10-CM | POA: Diagnosis not present

## 2016-06-14 DIAGNOSIS — I482 Chronic atrial fibrillation: Secondary | ICD-10-CM | POA: Diagnosis not present

## 2016-06-14 DIAGNOSIS — E785 Hyperlipidemia, unspecified: Secondary | ICD-10-CM | POA: Diagnosis not present

## 2016-06-14 DIAGNOSIS — K5909 Other constipation: Secondary | ICD-10-CM | POA: Diagnosis not present

## 2016-06-14 DIAGNOSIS — R627 Adult failure to thrive: Secondary | ICD-10-CM | POA: Diagnosis not present

## 2016-06-15 DIAGNOSIS — I10 Essential (primary) hypertension: Secondary | ICD-10-CM | POA: Diagnosis not present

## 2016-06-15 DIAGNOSIS — G909 Disorder of the autonomic nervous system, unspecified: Secondary | ICD-10-CM | POA: Diagnosis not present

## 2016-06-16 DIAGNOSIS — I482 Chronic atrial fibrillation: Secondary | ICD-10-CM | POA: Diagnosis not present

## 2016-06-16 DIAGNOSIS — E785 Hyperlipidemia, unspecified: Secondary | ICD-10-CM | POA: Diagnosis not present

## 2016-06-16 DIAGNOSIS — L89614 Pressure ulcer of right heel, stage 4: Secondary | ICD-10-CM | POA: Diagnosis not present

## 2016-06-16 DIAGNOSIS — G909 Disorder of the autonomic nervous system, unspecified: Secondary | ICD-10-CM | POA: Diagnosis not present

## 2016-06-16 DIAGNOSIS — E440 Moderate protein-calorie malnutrition: Secondary | ICD-10-CM | POA: Diagnosis not present

## 2016-06-16 DIAGNOSIS — E039 Hypothyroidism, unspecified: Secondary | ICD-10-CM | POA: Diagnosis not present

## 2016-06-16 DIAGNOSIS — Z950 Presence of cardiac pacemaker: Secondary | ICD-10-CM | POA: Diagnosis not present

## 2016-06-16 DIAGNOSIS — H409 Unspecified glaucoma: Secondary | ICD-10-CM | POA: Diagnosis not present

## 2016-06-16 DIAGNOSIS — Z4800 Encounter for change or removal of nonsurgical wound dressing: Secondary | ICD-10-CM | POA: Diagnosis not present

## 2016-06-16 DIAGNOSIS — R627 Adult failure to thrive: Secondary | ICD-10-CM | POA: Diagnosis not present

## 2016-06-16 DIAGNOSIS — G309 Alzheimer's disease, unspecified: Secondary | ICD-10-CM | POA: Diagnosis not present

## 2016-06-16 DIAGNOSIS — K5909 Other constipation: Secondary | ICD-10-CM | POA: Diagnosis not present

## 2016-06-18 DIAGNOSIS — E039 Hypothyroidism, unspecified: Secondary | ICD-10-CM | POA: Diagnosis not present

## 2016-06-18 DIAGNOSIS — Z4800 Encounter for change or removal of nonsurgical wound dressing: Secondary | ICD-10-CM | POA: Diagnosis not present

## 2016-06-18 DIAGNOSIS — I482 Chronic atrial fibrillation: Secondary | ICD-10-CM | POA: Diagnosis not present

## 2016-06-18 DIAGNOSIS — L89614 Pressure ulcer of right heel, stage 4: Secondary | ICD-10-CM | POA: Diagnosis not present

## 2016-06-18 DIAGNOSIS — E440 Moderate protein-calorie malnutrition: Secondary | ICD-10-CM | POA: Diagnosis not present

## 2016-06-18 DIAGNOSIS — R627 Adult failure to thrive: Secondary | ICD-10-CM | POA: Diagnosis not present

## 2016-06-18 DIAGNOSIS — Z950 Presence of cardiac pacemaker: Secondary | ICD-10-CM | POA: Diagnosis not present

## 2016-06-18 DIAGNOSIS — E785 Hyperlipidemia, unspecified: Secondary | ICD-10-CM | POA: Diagnosis not present

## 2016-06-18 DIAGNOSIS — K5909 Other constipation: Secondary | ICD-10-CM | POA: Diagnosis not present

## 2016-06-18 DIAGNOSIS — H409 Unspecified glaucoma: Secondary | ICD-10-CM | POA: Diagnosis not present

## 2016-06-18 DIAGNOSIS — G909 Disorder of the autonomic nervous system, unspecified: Secondary | ICD-10-CM | POA: Diagnosis not present

## 2016-06-18 DIAGNOSIS — G309 Alzheimer's disease, unspecified: Secondary | ICD-10-CM | POA: Diagnosis not present

## 2016-06-21 DIAGNOSIS — I482 Chronic atrial fibrillation: Secondary | ICD-10-CM | POA: Diagnosis not present

## 2016-06-21 DIAGNOSIS — H409 Unspecified glaucoma: Secondary | ICD-10-CM | POA: Diagnosis not present

## 2016-06-21 DIAGNOSIS — G309 Alzheimer's disease, unspecified: Secondary | ICD-10-CM | POA: Diagnosis not present

## 2016-06-21 DIAGNOSIS — G909 Disorder of the autonomic nervous system, unspecified: Secondary | ICD-10-CM | POA: Diagnosis not present

## 2016-06-21 DIAGNOSIS — Z950 Presence of cardiac pacemaker: Secondary | ICD-10-CM | POA: Diagnosis not present

## 2016-06-21 DIAGNOSIS — E039 Hypothyroidism, unspecified: Secondary | ICD-10-CM | POA: Diagnosis not present

## 2016-06-21 DIAGNOSIS — K5909 Other constipation: Secondary | ICD-10-CM | POA: Diagnosis not present

## 2016-06-21 DIAGNOSIS — L89614 Pressure ulcer of right heel, stage 4: Secondary | ICD-10-CM | POA: Diagnosis not present

## 2016-06-21 DIAGNOSIS — E785 Hyperlipidemia, unspecified: Secondary | ICD-10-CM | POA: Diagnosis not present

## 2016-06-21 DIAGNOSIS — E440 Moderate protein-calorie malnutrition: Secondary | ICD-10-CM | POA: Diagnosis not present

## 2016-06-21 DIAGNOSIS — Z4800 Encounter for change or removal of nonsurgical wound dressing: Secondary | ICD-10-CM | POA: Diagnosis not present

## 2016-06-21 DIAGNOSIS — R627 Adult failure to thrive: Secondary | ICD-10-CM | POA: Diagnosis not present

## 2016-06-23 DIAGNOSIS — G909 Disorder of the autonomic nervous system, unspecified: Secondary | ICD-10-CM | POA: Diagnosis not present

## 2016-06-23 DIAGNOSIS — E785 Hyperlipidemia, unspecified: Secondary | ICD-10-CM | POA: Diagnosis not present

## 2016-06-23 DIAGNOSIS — E440 Moderate protein-calorie malnutrition: Secondary | ICD-10-CM | POA: Diagnosis not present

## 2016-06-23 DIAGNOSIS — H409 Unspecified glaucoma: Secondary | ICD-10-CM | POA: Diagnosis not present

## 2016-06-23 DIAGNOSIS — Z950 Presence of cardiac pacemaker: Secondary | ICD-10-CM | POA: Diagnosis not present

## 2016-06-23 DIAGNOSIS — I482 Chronic atrial fibrillation: Secondary | ICD-10-CM | POA: Diagnosis not present

## 2016-06-23 DIAGNOSIS — E039 Hypothyroidism, unspecified: Secondary | ICD-10-CM | POA: Diagnosis not present

## 2016-06-23 DIAGNOSIS — K5909 Other constipation: Secondary | ICD-10-CM | POA: Diagnosis not present

## 2016-06-23 DIAGNOSIS — R627 Adult failure to thrive: Secondary | ICD-10-CM | POA: Diagnosis not present

## 2016-06-23 DIAGNOSIS — G309 Alzheimer's disease, unspecified: Secondary | ICD-10-CM | POA: Diagnosis not present

## 2016-06-23 DIAGNOSIS — L89614 Pressure ulcer of right heel, stage 4: Secondary | ICD-10-CM | POA: Diagnosis not present

## 2016-06-23 DIAGNOSIS — Z4800 Encounter for change or removal of nonsurgical wound dressing: Secondary | ICD-10-CM | POA: Diagnosis not present

## 2016-06-25 DIAGNOSIS — E039 Hypothyroidism, unspecified: Secondary | ICD-10-CM | POA: Diagnosis not present

## 2016-06-25 DIAGNOSIS — E785 Hyperlipidemia, unspecified: Secondary | ICD-10-CM | POA: Diagnosis not present

## 2016-06-25 DIAGNOSIS — Z4800 Encounter for change or removal of nonsurgical wound dressing: Secondary | ICD-10-CM | POA: Diagnosis not present

## 2016-06-25 DIAGNOSIS — G309 Alzheimer's disease, unspecified: Secondary | ICD-10-CM | POA: Diagnosis not present

## 2016-06-25 DIAGNOSIS — H409 Unspecified glaucoma: Secondary | ICD-10-CM | POA: Diagnosis not present

## 2016-06-25 DIAGNOSIS — L89614 Pressure ulcer of right heel, stage 4: Secondary | ICD-10-CM | POA: Diagnosis not present

## 2016-06-25 DIAGNOSIS — Z950 Presence of cardiac pacemaker: Secondary | ICD-10-CM | POA: Diagnosis not present

## 2016-06-25 DIAGNOSIS — G909 Disorder of the autonomic nervous system, unspecified: Secondary | ICD-10-CM | POA: Diagnosis not present

## 2016-06-25 DIAGNOSIS — I482 Chronic atrial fibrillation: Secondary | ICD-10-CM | POA: Diagnosis not present

## 2016-06-25 DIAGNOSIS — R627 Adult failure to thrive: Secondary | ICD-10-CM | POA: Diagnosis not present

## 2016-06-25 DIAGNOSIS — K5909 Other constipation: Secondary | ICD-10-CM | POA: Diagnosis not present

## 2016-06-25 DIAGNOSIS — E440 Moderate protein-calorie malnutrition: Secondary | ICD-10-CM | POA: Diagnosis not present

## 2016-06-28 DIAGNOSIS — K5909 Other constipation: Secondary | ICD-10-CM | POA: Diagnosis not present

## 2016-06-28 DIAGNOSIS — H409 Unspecified glaucoma: Secondary | ICD-10-CM | POA: Diagnosis not present

## 2016-06-28 DIAGNOSIS — G909 Disorder of the autonomic nervous system, unspecified: Secondary | ICD-10-CM | POA: Diagnosis not present

## 2016-06-28 DIAGNOSIS — Z950 Presence of cardiac pacemaker: Secondary | ICD-10-CM | POA: Diagnosis not present

## 2016-06-28 DIAGNOSIS — E440 Moderate protein-calorie malnutrition: Secondary | ICD-10-CM | POA: Diagnosis not present

## 2016-06-28 DIAGNOSIS — G309 Alzheimer's disease, unspecified: Secondary | ICD-10-CM | POA: Diagnosis not present

## 2016-06-28 DIAGNOSIS — I482 Chronic atrial fibrillation: Secondary | ICD-10-CM | POA: Diagnosis not present

## 2016-06-28 DIAGNOSIS — Z4800 Encounter for change or removal of nonsurgical wound dressing: Secondary | ICD-10-CM | POA: Diagnosis not present

## 2016-06-28 DIAGNOSIS — R627 Adult failure to thrive: Secondary | ICD-10-CM | POA: Diagnosis not present

## 2016-06-28 DIAGNOSIS — E785 Hyperlipidemia, unspecified: Secondary | ICD-10-CM | POA: Diagnosis not present

## 2016-06-28 DIAGNOSIS — L89614 Pressure ulcer of right heel, stage 4: Secondary | ICD-10-CM | POA: Diagnosis not present

## 2016-06-28 DIAGNOSIS — E039 Hypothyroidism, unspecified: Secondary | ICD-10-CM | POA: Diagnosis not present

## 2016-06-30 DIAGNOSIS — Z4800 Encounter for change or removal of nonsurgical wound dressing: Secondary | ICD-10-CM | POA: Diagnosis not present

## 2016-06-30 DIAGNOSIS — E440 Moderate protein-calorie malnutrition: Secondary | ICD-10-CM | POA: Diagnosis not present

## 2016-06-30 DIAGNOSIS — E785 Hyperlipidemia, unspecified: Secondary | ICD-10-CM | POA: Diagnosis not present

## 2016-06-30 DIAGNOSIS — H409 Unspecified glaucoma: Secondary | ICD-10-CM | POA: Diagnosis not present

## 2016-06-30 DIAGNOSIS — Z950 Presence of cardiac pacemaker: Secondary | ICD-10-CM | POA: Diagnosis not present

## 2016-06-30 DIAGNOSIS — I482 Chronic atrial fibrillation: Secondary | ICD-10-CM | POA: Diagnosis not present

## 2016-06-30 DIAGNOSIS — K5909 Other constipation: Secondary | ICD-10-CM | POA: Diagnosis not present

## 2016-06-30 DIAGNOSIS — E039 Hypothyroidism, unspecified: Secondary | ICD-10-CM | POA: Diagnosis not present

## 2016-06-30 DIAGNOSIS — G909 Disorder of the autonomic nervous system, unspecified: Secondary | ICD-10-CM | POA: Diagnosis not present

## 2016-06-30 DIAGNOSIS — L89614 Pressure ulcer of right heel, stage 4: Secondary | ICD-10-CM | POA: Diagnosis not present

## 2016-06-30 DIAGNOSIS — G309 Alzheimer's disease, unspecified: Secondary | ICD-10-CM | POA: Diagnosis not present

## 2016-06-30 DIAGNOSIS — R627 Adult failure to thrive: Secondary | ICD-10-CM | POA: Diagnosis not present

## 2016-07-01 ENCOUNTER — Other Ambulatory Visit: Payer: Self-pay | Admitting: Cardiovascular Disease

## 2016-07-02 DIAGNOSIS — G909 Disorder of the autonomic nervous system, unspecified: Secondary | ICD-10-CM | POA: Diagnosis not present

## 2016-07-02 DIAGNOSIS — E039 Hypothyroidism, unspecified: Secondary | ICD-10-CM | POA: Diagnosis not present

## 2016-07-02 DIAGNOSIS — G309 Alzheimer's disease, unspecified: Secondary | ICD-10-CM | POA: Diagnosis not present

## 2016-07-02 DIAGNOSIS — H409 Unspecified glaucoma: Secondary | ICD-10-CM | POA: Diagnosis not present

## 2016-07-02 DIAGNOSIS — L89614 Pressure ulcer of right heel, stage 4: Secondary | ICD-10-CM | POA: Diagnosis not present

## 2016-07-02 DIAGNOSIS — I482 Chronic atrial fibrillation: Secondary | ICD-10-CM | POA: Diagnosis not present

## 2016-07-02 DIAGNOSIS — R627 Adult failure to thrive: Secondary | ICD-10-CM | POA: Diagnosis not present

## 2016-07-02 DIAGNOSIS — Z4800 Encounter for change or removal of nonsurgical wound dressing: Secondary | ICD-10-CM | POA: Diagnosis not present

## 2016-07-02 DIAGNOSIS — E785 Hyperlipidemia, unspecified: Secondary | ICD-10-CM | POA: Diagnosis not present

## 2016-07-02 DIAGNOSIS — K5909 Other constipation: Secondary | ICD-10-CM | POA: Diagnosis not present

## 2016-07-02 DIAGNOSIS — Z950 Presence of cardiac pacemaker: Secondary | ICD-10-CM | POA: Diagnosis not present

## 2016-07-02 DIAGNOSIS — E440 Moderate protein-calorie malnutrition: Secondary | ICD-10-CM | POA: Diagnosis not present

## 2016-07-05 DIAGNOSIS — R627 Adult failure to thrive: Secondary | ICD-10-CM | POA: Diagnosis not present

## 2016-07-05 DIAGNOSIS — G909 Disorder of the autonomic nervous system, unspecified: Secondary | ICD-10-CM | POA: Diagnosis not present

## 2016-07-05 DIAGNOSIS — H409 Unspecified glaucoma: Secondary | ICD-10-CM | POA: Diagnosis not present

## 2016-07-05 DIAGNOSIS — E039 Hypothyroidism, unspecified: Secondary | ICD-10-CM | POA: Diagnosis not present

## 2016-07-05 DIAGNOSIS — L89614 Pressure ulcer of right heel, stage 4: Secondary | ICD-10-CM | POA: Diagnosis not present

## 2016-07-05 DIAGNOSIS — E440 Moderate protein-calorie malnutrition: Secondary | ICD-10-CM | POA: Diagnosis not present

## 2016-07-05 DIAGNOSIS — E785 Hyperlipidemia, unspecified: Secondary | ICD-10-CM | POA: Diagnosis not present

## 2016-07-05 DIAGNOSIS — I482 Chronic atrial fibrillation: Secondary | ICD-10-CM | POA: Diagnosis not present

## 2016-07-05 DIAGNOSIS — Z4800 Encounter for change or removal of nonsurgical wound dressing: Secondary | ICD-10-CM | POA: Diagnosis not present

## 2016-07-05 DIAGNOSIS — K5909 Other constipation: Secondary | ICD-10-CM | POA: Diagnosis not present

## 2016-07-05 DIAGNOSIS — G309 Alzheimer's disease, unspecified: Secondary | ICD-10-CM | POA: Diagnosis not present

## 2016-07-05 DIAGNOSIS — Z950 Presence of cardiac pacemaker: Secondary | ICD-10-CM | POA: Diagnosis not present

## 2016-07-06 DIAGNOSIS — L89613 Pressure ulcer of right heel, stage 3: Secondary | ICD-10-CM | POA: Diagnosis not present

## 2016-07-09 DIAGNOSIS — E039 Hypothyroidism, unspecified: Secondary | ICD-10-CM | POA: Diagnosis not present

## 2016-07-09 DIAGNOSIS — K5909 Other constipation: Secondary | ICD-10-CM | POA: Diagnosis not present

## 2016-07-09 DIAGNOSIS — G909 Disorder of the autonomic nervous system, unspecified: Secondary | ICD-10-CM | POA: Diagnosis not present

## 2016-07-09 DIAGNOSIS — L89614 Pressure ulcer of right heel, stage 4: Secondary | ICD-10-CM | POA: Diagnosis not present

## 2016-07-09 DIAGNOSIS — Z950 Presence of cardiac pacemaker: Secondary | ICD-10-CM | POA: Diagnosis not present

## 2016-07-09 DIAGNOSIS — Z4800 Encounter for change or removal of nonsurgical wound dressing: Secondary | ICD-10-CM | POA: Diagnosis not present

## 2016-07-09 DIAGNOSIS — H409 Unspecified glaucoma: Secondary | ICD-10-CM | POA: Diagnosis not present

## 2016-07-09 DIAGNOSIS — G309 Alzheimer's disease, unspecified: Secondary | ICD-10-CM | POA: Diagnosis not present

## 2016-07-09 DIAGNOSIS — E785 Hyperlipidemia, unspecified: Secondary | ICD-10-CM | POA: Diagnosis not present

## 2016-07-09 DIAGNOSIS — I482 Chronic atrial fibrillation: Secondary | ICD-10-CM | POA: Diagnosis not present

## 2016-07-09 DIAGNOSIS — R627 Adult failure to thrive: Secondary | ICD-10-CM | POA: Diagnosis not present

## 2016-07-09 DIAGNOSIS — E440 Moderate protein-calorie malnutrition: Secondary | ICD-10-CM | POA: Diagnosis not present

## 2016-07-12 DIAGNOSIS — E440 Moderate protein-calorie malnutrition: Secondary | ICD-10-CM | POA: Diagnosis not present

## 2016-07-12 DIAGNOSIS — H409 Unspecified glaucoma: Secondary | ICD-10-CM | POA: Diagnosis not present

## 2016-07-12 DIAGNOSIS — I482 Chronic atrial fibrillation: Secondary | ICD-10-CM | POA: Diagnosis not present

## 2016-07-12 DIAGNOSIS — E039 Hypothyroidism, unspecified: Secondary | ICD-10-CM | POA: Diagnosis not present

## 2016-07-12 DIAGNOSIS — K5909 Other constipation: Secondary | ICD-10-CM | POA: Diagnosis not present

## 2016-07-12 DIAGNOSIS — G909 Disorder of the autonomic nervous system, unspecified: Secondary | ICD-10-CM | POA: Diagnosis not present

## 2016-07-12 DIAGNOSIS — G309 Alzheimer's disease, unspecified: Secondary | ICD-10-CM | POA: Diagnosis not present

## 2016-07-12 DIAGNOSIS — Z4800 Encounter for change or removal of nonsurgical wound dressing: Secondary | ICD-10-CM | POA: Diagnosis not present

## 2016-07-12 DIAGNOSIS — L89614 Pressure ulcer of right heel, stage 4: Secondary | ICD-10-CM | POA: Diagnosis not present

## 2016-07-12 DIAGNOSIS — Z950 Presence of cardiac pacemaker: Secondary | ICD-10-CM | POA: Diagnosis not present

## 2016-07-12 DIAGNOSIS — E785 Hyperlipidemia, unspecified: Secondary | ICD-10-CM | POA: Diagnosis not present

## 2016-07-12 DIAGNOSIS — R627 Adult failure to thrive: Secondary | ICD-10-CM | POA: Diagnosis not present

## 2016-07-14 DIAGNOSIS — Z4800 Encounter for change or removal of nonsurgical wound dressing: Secondary | ICD-10-CM | POA: Diagnosis not present

## 2016-07-14 DIAGNOSIS — E785 Hyperlipidemia, unspecified: Secondary | ICD-10-CM | POA: Diagnosis not present

## 2016-07-14 DIAGNOSIS — I482 Chronic atrial fibrillation: Secondary | ICD-10-CM | POA: Diagnosis not present

## 2016-07-14 DIAGNOSIS — Z950 Presence of cardiac pacemaker: Secondary | ICD-10-CM | POA: Diagnosis not present

## 2016-07-14 DIAGNOSIS — E440 Moderate protein-calorie malnutrition: Secondary | ICD-10-CM | POA: Diagnosis not present

## 2016-07-14 DIAGNOSIS — K5909 Other constipation: Secondary | ICD-10-CM | POA: Diagnosis not present

## 2016-07-14 DIAGNOSIS — G309 Alzheimer's disease, unspecified: Secondary | ICD-10-CM | POA: Diagnosis not present

## 2016-07-14 DIAGNOSIS — H409 Unspecified glaucoma: Secondary | ICD-10-CM | POA: Diagnosis not present

## 2016-07-14 DIAGNOSIS — R627 Adult failure to thrive: Secondary | ICD-10-CM | POA: Diagnosis not present

## 2016-07-14 DIAGNOSIS — L89614 Pressure ulcer of right heel, stage 4: Secondary | ICD-10-CM | POA: Diagnosis not present

## 2016-07-14 DIAGNOSIS — E039 Hypothyroidism, unspecified: Secondary | ICD-10-CM | POA: Diagnosis not present

## 2016-07-14 DIAGNOSIS — G909 Disorder of the autonomic nervous system, unspecified: Secondary | ICD-10-CM | POA: Diagnosis not present

## 2016-07-15 DIAGNOSIS — G909 Disorder of the autonomic nervous system, unspecified: Secondary | ICD-10-CM | POA: Diagnosis not present

## 2016-07-15 DIAGNOSIS — I10 Essential (primary) hypertension: Secondary | ICD-10-CM | POA: Diagnosis not present

## 2016-07-16 DIAGNOSIS — E039 Hypothyroidism, unspecified: Secondary | ICD-10-CM | POA: Diagnosis not present

## 2016-07-16 DIAGNOSIS — Z4800 Encounter for change or removal of nonsurgical wound dressing: Secondary | ICD-10-CM | POA: Diagnosis not present

## 2016-07-16 DIAGNOSIS — K5909 Other constipation: Secondary | ICD-10-CM | POA: Diagnosis not present

## 2016-07-16 DIAGNOSIS — E785 Hyperlipidemia, unspecified: Secondary | ICD-10-CM | POA: Diagnosis not present

## 2016-07-16 DIAGNOSIS — L89614 Pressure ulcer of right heel, stage 4: Secondary | ICD-10-CM | POA: Diagnosis not present

## 2016-07-16 DIAGNOSIS — E440 Moderate protein-calorie malnutrition: Secondary | ICD-10-CM | POA: Diagnosis not present

## 2016-07-16 DIAGNOSIS — I482 Chronic atrial fibrillation: Secondary | ICD-10-CM | POA: Diagnosis not present

## 2016-07-16 DIAGNOSIS — G909 Disorder of the autonomic nervous system, unspecified: Secondary | ICD-10-CM | POA: Diagnosis not present

## 2016-07-16 DIAGNOSIS — R627 Adult failure to thrive: Secondary | ICD-10-CM | POA: Diagnosis not present

## 2016-07-16 DIAGNOSIS — H409 Unspecified glaucoma: Secondary | ICD-10-CM | POA: Diagnosis not present

## 2016-07-16 DIAGNOSIS — G309 Alzheimer's disease, unspecified: Secondary | ICD-10-CM | POA: Diagnosis not present

## 2016-07-16 DIAGNOSIS — Z950 Presence of cardiac pacemaker: Secondary | ICD-10-CM | POA: Diagnosis not present

## 2016-07-19 DIAGNOSIS — Z950 Presence of cardiac pacemaker: Secondary | ICD-10-CM | POA: Diagnosis not present

## 2016-07-19 DIAGNOSIS — K5909 Other constipation: Secondary | ICD-10-CM | POA: Diagnosis not present

## 2016-07-19 DIAGNOSIS — L89614 Pressure ulcer of right heel, stage 4: Secondary | ICD-10-CM | POA: Diagnosis not present

## 2016-07-19 DIAGNOSIS — R627 Adult failure to thrive: Secondary | ICD-10-CM | POA: Diagnosis not present

## 2016-07-19 DIAGNOSIS — E785 Hyperlipidemia, unspecified: Secondary | ICD-10-CM | POA: Diagnosis not present

## 2016-07-19 DIAGNOSIS — E039 Hypothyroidism, unspecified: Secondary | ICD-10-CM | POA: Diagnosis not present

## 2016-07-19 DIAGNOSIS — Z4800 Encounter for change or removal of nonsurgical wound dressing: Secondary | ICD-10-CM | POA: Diagnosis not present

## 2016-07-19 DIAGNOSIS — E440 Moderate protein-calorie malnutrition: Secondary | ICD-10-CM | POA: Diagnosis not present

## 2016-07-19 DIAGNOSIS — G909 Disorder of the autonomic nervous system, unspecified: Secondary | ICD-10-CM | POA: Diagnosis not present

## 2016-07-19 DIAGNOSIS — H409 Unspecified glaucoma: Secondary | ICD-10-CM | POA: Diagnosis not present

## 2016-07-19 DIAGNOSIS — G309 Alzheimer's disease, unspecified: Secondary | ICD-10-CM | POA: Diagnosis not present

## 2016-07-19 DIAGNOSIS — I482 Chronic atrial fibrillation: Secondary | ICD-10-CM | POA: Diagnosis not present

## 2016-07-20 DIAGNOSIS — I482 Chronic atrial fibrillation: Secondary | ICD-10-CM | POA: Diagnosis not present

## 2016-07-20 DIAGNOSIS — I10 Essential (primary) hypertension: Secondary | ICD-10-CM | POA: Diagnosis not present

## 2016-07-20 DIAGNOSIS — N182 Chronic kidney disease, stage 2 (mild): Secondary | ICD-10-CM | POA: Diagnosis not present

## 2016-07-20 DIAGNOSIS — E038 Other specified hypothyroidism: Secondary | ICD-10-CM | POA: Diagnosis not present

## 2016-07-20 DIAGNOSIS — L89153 Pressure ulcer of sacral region, stage 3: Secondary | ICD-10-CM | POA: Diagnosis not present

## 2016-07-21 DIAGNOSIS — Z4800 Encounter for change or removal of nonsurgical wound dressing: Secondary | ICD-10-CM | POA: Diagnosis not present

## 2016-07-21 DIAGNOSIS — E784 Other hyperlipidemia: Secondary | ICD-10-CM | POA: Diagnosis not present

## 2016-07-21 DIAGNOSIS — E039 Hypothyroidism, unspecified: Secondary | ICD-10-CM | POA: Diagnosis not present

## 2016-07-21 DIAGNOSIS — K5909 Other constipation: Secondary | ICD-10-CM | POA: Diagnosis not present

## 2016-07-21 DIAGNOSIS — R627 Adult failure to thrive: Secondary | ICD-10-CM | POA: Diagnosis not present

## 2016-07-21 DIAGNOSIS — L89614 Pressure ulcer of right heel, stage 4: Secondary | ICD-10-CM | POA: Diagnosis not present

## 2016-07-21 DIAGNOSIS — Z950 Presence of cardiac pacemaker: Secondary | ICD-10-CM | POA: Diagnosis not present

## 2016-07-21 DIAGNOSIS — G309 Alzheimer's disease, unspecified: Secondary | ICD-10-CM | POA: Diagnosis not present

## 2016-07-21 DIAGNOSIS — I10 Essential (primary) hypertension: Secondary | ICD-10-CM | POA: Diagnosis not present

## 2016-07-21 DIAGNOSIS — G909 Disorder of the autonomic nervous system, unspecified: Secondary | ICD-10-CM | POA: Diagnosis not present

## 2016-07-21 DIAGNOSIS — E038 Other specified hypothyroidism: Secondary | ICD-10-CM | POA: Diagnosis not present

## 2016-07-21 DIAGNOSIS — E785 Hyperlipidemia, unspecified: Secondary | ICD-10-CM | POA: Diagnosis not present

## 2016-07-21 DIAGNOSIS — H409 Unspecified glaucoma: Secondary | ICD-10-CM | POA: Diagnosis not present

## 2016-07-21 DIAGNOSIS — E440 Moderate protein-calorie malnutrition: Secondary | ICD-10-CM | POA: Diagnosis not present

## 2016-07-21 DIAGNOSIS — I482 Chronic atrial fibrillation: Secondary | ICD-10-CM | POA: Diagnosis not present

## 2016-07-23 DIAGNOSIS — Z950 Presence of cardiac pacemaker: Secondary | ICD-10-CM | POA: Diagnosis not present

## 2016-07-23 DIAGNOSIS — E785 Hyperlipidemia, unspecified: Secondary | ICD-10-CM | POA: Diagnosis not present

## 2016-07-23 DIAGNOSIS — Z4800 Encounter for change or removal of nonsurgical wound dressing: Secondary | ICD-10-CM | POA: Diagnosis not present

## 2016-07-23 DIAGNOSIS — E039 Hypothyroidism, unspecified: Secondary | ICD-10-CM | POA: Diagnosis not present

## 2016-07-23 DIAGNOSIS — G909 Disorder of the autonomic nervous system, unspecified: Secondary | ICD-10-CM | POA: Diagnosis not present

## 2016-07-23 DIAGNOSIS — L89614 Pressure ulcer of right heel, stage 4: Secondary | ICD-10-CM | POA: Diagnosis not present

## 2016-07-23 DIAGNOSIS — E440 Moderate protein-calorie malnutrition: Secondary | ICD-10-CM | POA: Diagnosis not present

## 2016-07-23 DIAGNOSIS — G309 Alzheimer's disease, unspecified: Secondary | ICD-10-CM | POA: Diagnosis not present

## 2016-07-23 DIAGNOSIS — H409 Unspecified glaucoma: Secondary | ICD-10-CM | POA: Diagnosis not present

## 2016-07-23 DIAGNOSIS — I482 Chronic atrial fibrillation: Secondary | ICD-10-CM | POA: Diagnosis not present

## 2016-07-23 DIAGNOSIS — K5909 Other constipation: Secondary | ICD-10-CM | POA: Diagnosis not present

## 2016-07-23 DIAGNOSIS — R627 Adult failure to thrive: Secondary | ICD-10-CM | POA: Diagnosis not present

## 2016-07-26 DIAGNOSIS — K5909 Other constipation: Secondary | ICD-10-CM | POA: Diagnosis not present

## 2016-07-26 DIAGNOSIS — Z4800 Encounter for change or removal of nonsurgical wound dressing: Secondary | ICD-10-CM | POA: Diagnosis not present

## 2016-07-26 DIAGNOSIS — H409 Unspecified glaucoma: Secondary | ICD-10-CM | POA: Diagnosis not present

## 2016-07-26 DIAGNOSIS — E785 Hyperlipidemia, unspecified: Secondary | ICD-10-CM | POA: Diagnosis not present

## 2016-07-26 DIAGNOSIS — G909 Disorder of the autonomic nervous system, unspecified: Secondary | ICD-10-CM | POA: Diagnosis not present

## 2016-07-26 DIAGNOSIS — E440 Moderate protein-calorie malnutrition: Secondary | ICD-10-CM | POA: Diagnosis not present

## 2016-07-26 DIAGNOSIS — I482 Chronic atrial fibrillation: Secondary | ICD-10-CM | POA: Diagnosis not present

## 2016-07-26 DIAGNOSIS — Z950 Presence of cardiac pacemaker: Secondary | ICD-10-CM | POA: Diagnosis not present

## 2016-07-26 DIAGNOSIS — G309 Alzheimer's disease, unspecified: Secondary | ICD-10-CM | POA: Diagnosis not present

## 2016-07-26 DIAGNOSIS — R627 Adult failure to thrive: Secondary | ICD-10-CM | POA: Diagnosis not present

## 2016-07-26 DIAGNOSIS — E039 Hypothyroidism, unspecified: Secondary | ICD-10-CM | POA: Diagnosis not present

## 2016-07-26 DIAGNOSIS — L89614 Pressure ulcer of right heel, stage 4: Secondary | ICD-10-CM | POA: Diagnosis not present

## 2016-07-28 DIAGNOSIS — L89614 Pressure ulcer of right heel, stage 4: Secondary | ICD-10-CM | POA: Diagnosis not present

## 2016-07-28 DIAGNOSIS — G909 Disorder of the autonomic nervous system, unspecified: Secondary | ICD-10-CM | POA: Diagnosis not present

## 2016-07-28 DIAGNOSIS — E785 Hyperlipidemia, unspecified: Secondary | ICD-10-CM | POA: Diagnosis not present

## 2016-07-28 DIAGNOSIS — E440 Moderate protein-calorie malnutrition: Secondary | ICD-10-CM | POA: Diagnosis not present

## 2016-07-28 DIAGNOSIS — K5909 Other constipation: Secondary | ICD-10-CM | POA: Diagnosis not present

## 2016-07-28 DIAGNOSIS — G309 Alzheimer's disease, unspecified: Secondary | ICD-10-CM | POA: Diagnosis not present

## 2016-07-28 DIAGNOSIS — I482 Chronic atrial fibrillation: Secondary | ICD-10-CM | POA: Diagnosis not present

## 2016-07-28 DIAGNOSIS — R627 Adult failure to thrive: Secondary | ICD-10-CM | POA: Diagnosis not present

## 2016-07-28 DIAGNOSIS — H409 Unspecified glaucoma: Secondary | ICD-10-CM | POA: Diagnosis not present

## 2016-07-28 DIAGNOSIS — E039 Hypothyroidism, unspecified: Secondary | ICD-10-CM | POA: Diagnosis not present

## 2016-07-28 DIAGNOSIS — Z4800 Encounter for change or removal of nonsurgical wound dressing: Secondary | ICD-10-CM | POA: Diagnosis not present

## 2016-07-28 DIAGNOSIS — Z950 Presence of cardiac pacemaker: Secondary | ICD-10-CM | POA: Diagnosis not present

## 2016-07-29 DIAGNOSIS — L89613 Pressure ulcer of right heel, stage 3: Secondary | ICD-10-CM | POA: Diagnosis not present

## 2016-07-30 DIAGNOSIS — G309 Alzheimer's disease, unspecified: Secondary | ICD-10-CM | POA: Diagnosis not present

## 2016-07-30 DIAGNOSIS — K5909 Other constipation: Secondary | ICD-10-CM | POA: Diagnosis not present

## 2016-07-30 DIAGNOSIS — Z4800 Encounter for change or removal of nonsurgical wound dressing: Secondary | ICD-10-CM | POA: Diagnosis not present

## 2016-07-30 DIAGNOSIS — H409 Unspecified glaucoma: Secondary | ICD-10-CM | POA: Diagnosis not present

## 2016-07-30 DIAGNOSIS — Z950 Presence of cardiac pacemaker: Secondary | ICD-10-CM | POA: Diagnosis not present

## 2016-07-30 DIAGNOSIS — G909 Disorder of the autonomic nervous system, unspecified: Secondary | ICD-10-CM | POA: Diagnosis not present

## 2016-07-30 DIAGNOSIS — E039 Hypothyroidism, unspecified: Secondary | ICD-10-CM | POA: Diagnosis not present

## 2016-07-30 DIAGNOSIS — E440 Moderate protein-calorie malnutrition: Secondary | ICD-10-CM | POA: Diagnosis not present

## 2016-07-30 DIAGNOSIS — E785 Hyperlipidemia, unspecified: Secondary | ICD-10-CM | POA: Diagnosis not present

## 2016-07-30 DIAGNOSIS — I482 Chronic atrial fibrillation: Secondary | ICD-10-CM | POA: Diagnosis not present

## 2016-07-30 DIAGNOSIS — L89614 Pressure ulcer of right heel, stage 4: Secondary | ICD-10-CM | POA: Diagnosis not present

## 2016-07-30 DIAGNOSIS — R627 Adult failure to thrive: Secondary | ICD-10-CM | POA: Diagnosis not present

## 2016-08-02 DIAGNOSIS — E785 Hyperlipidemia, unspecified: Secondary | ICD-10-CM | POA: Diagnosis not present

## 2016-08-02 DIAGNOSIS — Z950 Presence of cardiac pacemaker: Secondary | ICD-10-CM | POA: Diagnosis not present

## 2016-08-02 DIAGNOSIS — E039 Hypothyroidism, unspecified: Secondary | ICD-10-CM | POA: Diagnosis not present

## 2016-08-02 DIAGNOSIS — H409 Unspecified glaucoma: Secondary | ICD-10-CM | POA: Diagnosis not present

## 2016-08-02 DIAGNOSIS — E440 Moderate protein-calorie malnutrition: Secondary | ICD-10-CM | POA: Diagnosis not present

## 2016-08-02 DIAGNOSIS — Z4800 Encounter for change or removal of nonsurgical wound dressing: Secondary | ICD-10-CM | POA: Diagnosis not present

## 2016-08-02 DIAGNOSIS — L89614 Pressure ulcer of right heel, stage 4: Secondary | ICD-10-CM | POA: Diagnosis not present

## 2016-08-02 DIAGNOSIS — I482 Chronic atrial fibrillation: Secondary | ICD-10-CM | POA: Diagnosis not present

## 2016-08-02 DIAGNOSIS — G309 Alzheimer's disease, unspecified: Secondary | ICD-10-CM | POA: Diagnosis not present

## 2016-08-02 DIAGNOSIS — R627 Adult failure to thrive: Secondary | ICD-10-CM | POA: Diagnosis not present

## 2016-08-02 DIAGNOSIS — K5909 Other constipation: Secondary | ICD-10-CM | POA: Diagnosis not present

## 2016-08-02 DIAGNOSIS — G909 Disorder of the autonomic nervous system, unspecified: Secondary | ICD-10-CM | POA: Diagnosis not present

## 2016-08-04 ENCOUNTER — Other Ambulatory Visit: Payer: Self-pay | Admitting: Cardiovascular Disease

## 2016-08-04 ENCOUNTER — Telehealth: Payer: Self-pay | Admitting: Cardiovascular Disease

## 2016-08-04 DIAGNOSIS — G309 Alzheimer's disease, unspecified: Secondary | ICD-10-CM | POA: Diagnosis not present

## 2016-08-04 DIAGNOSIS — R627 Adult failure to thrive: Secondary | ICD-10-CM | POA: Diagnosis not present

## 2016-08-04 DIAGNOSIS — I482 Chronic atrial fibrillation: Secondary | ICD-10-CM | POA: Diagnosis not present

## 2016-08-04 DIAGNOSIS — H409 Unspecified glaucoma: Secondary | ICD-10-CM | POA: Diagnosis not present

## 2016-08-04 DIAGNOSIS — K5909 Other constipation: Secondary | ICD-10-CM | POA: Diagnosis not present

## 2016-08-04 DIAGNOSIS — E440 Moderate protein-calorie malnutrition: Secondary | ICD-10-CM | POA: Diagnosis not present

## 2016-08-04 DIAGNOSIS — Z4800 Encounter for change or removal of nonsurgical wound dressing: Secondary | ICD-10-CM | POA: Diagnosis not present

## 2016-08-04 DIAGNOSIS — L89614 Pressure ulcer of right heel, stage 4: Secondary | ICD-10-CM | POA: Diagnosis not present

## 2016-08-04 DIAGNOSIS — E785 Hyperlipidemia, unspecified: Secondary | ICD-10-CM | POA: Diagnosis not present

## 2016-08-04 DIAGNOSIS — Z950 Presence of cardiac pacemaker: Secondary | ICD-10-CM | POA: Diagnosis not present

## 2016-08-04 DIAGNOSIS — G909 Disorder of the autonomic nervous system, unspecified: Secondary | ICD-10-CM | POA: Diagnosis not present

## 2016-08-04 DIAGNOSIS — E039 Hypothyroidism, unspecified: Secondary | ICD-10-CM | POA: Diagnosis not present

## 2016-08-04 MED ORDER — AMIODARONE HCL 200 MG PO TABS: ORAL_TABLET | ORAL | 0 refills | Status: DC

## 2016-08-04 NOTE — Telephone Encounter (Signed)
New message     *STAT* If patient is at the pharmacy, call can be transferred to refill team.   1. Which medications need to be refilled? (please list name of each medication and dose if known) amiodarone (PACERONE) 200 MG tablet  2. Which pharmacy/location (including street and city if local pharmacy) is medication to be sent to? [facility] in [place]    3. Do they need a 30 day or 90 day supply? 30 day suppy

## 2016-08-06 DIAGNOSIS — Z4800 Encounter for change or removal of nonsurgical wound dressing: Secondary | ICD-10-CM | POA: Diagnosis not present

## 2016-08-06 DIAGNOSIS — E785 Hyperlipidemia, unspecified: Secondary | ICD-10-CM | POA: Diagnosis not present

## 2016-08-06 DIAGNOSIS — L89614 Pressure ulcer of right heel, stage 4: Secondary | ICD-10-CM | POA: Diagnosis not present

## 2016-08-06 DIAGNOSIS — G909 Disorder of the autonomic nervous system, unspecified: Secondary | ICD-10-CM | POA: Diagnosis not present

## 2016-08-06 DIAGNOSIS — Z950 Presence of cardiac pacemaker: Secondary | ICD-10-CM | POA: Diagnosis not present

## 2016-08-06 DIAGNOSIS — E440 Moderate protein-calorie malnutrition: Secondary | ICD-10-CM | POA: Diagnosis not present

## 2016-08-06 DIAGNOSIS — I482 Chronic atrial fibrillation: Secondary | ICD-10-CM | POA: Diagnosis not present

## 2016-08-06 DIAGNOSIS — G309 Alzheimer's disease, unspecified: Secondary | ICD-10-CM | POA: Diagnosis not present

## 2016-08-06 DIAGNOSIS — R627 Adult failure to thrive: Secondary | ICD-10-CM | POA: Diagnosis not present

## 2016-08-06 DIAGNOSIS — E039 Hypothyroidism, unspecified: Secondary | ICD-10-CM | POA: Diagnosis not present

## 2016-08-06 DIAGNOSIS — H409 Unspecified glaucoma: Secondary | ICD-10-CM | POA: Diagnosis not present

## 2016-08-06 DIAGNOSIS — K5909 Other constipation: Secondary | ICD-10-CM | POA: Diagnosis not present

## 2016-08-15 DIAGNOSIS — I10 Essential (primary) hypertension: Secondary | ICD-10-CM | POA: Diagnosis not present

## 2016-08-15 DIAGNOSIS — G909 Disorder of the autonomic nervous system, unspecified: Secondary | ICD-10-CM | POA: Diagnosis not present

## 2016-08-27 ENCOUNTER — Ambulatory Visit (INDEPENDENT_AMBULATORY_CARE_PROVIDER_SITE_OTHER): Payer: Medicare Other | Admitting: Cardiovascular Disease

## 2016-08-27 ENCOUNTER — Encounter: Payer: Self-pay | Admitting: Cardiovascular Disease

## 2016-08-27 VITALS — BP 97/66 | HR 77 | Wt 127.0 lb

## 2016-08-27 DIAGNOSIS — R001 Bradycardia, unspecified: Secondary | ICD-10-CM | POA: Diagnosis not present

## 2016-08-27 DIAGNOSIS — S065X9A Traumatic subdural hemorrhage with loss of consciousness of unspecified duration, initial encounter: Secondary | ICD-10-CM

## 2016-08-27 DIAGNOSIS — I10 Essential (primary) hypertension: Secondary | ICD-10-CM | POA: Diagnosis not present

## 2016-08-27 DIAGNOSIS — I6200 Nontraumatic subdural hemorrhage, unspecified: Secondary | ICD-10-CM

## 2016-08-27 DIAGNOSIS — I480 Paroxysmal atrial fibrillation: Secondary | ICD-10-CM | POA: Diagnosis not present

## 2016-08-27 DIAGNOSIS — Z950 Presence of cardiac pacemaker: Secondary | ICD-10-CM

## 2016-08-27 DIAGNOSIS — I951 Orthostatic hypotension: Secondary | ICD-10-CM

## 2016-08-27 DIAGNOSIS — S065XAA Traumatic subdural hemorrhage with loss of consciousness status unknown, initial encounter: Secondary | ICD-10-CM

## 2016-08-27 NOTE — Progress Notes (Signed)
Patient ID: Reginald [name], male   DOB: 12/01/1924, 83 y.o.   MRN: [id number]    Cardiology Office Note    Date:  08/29/2016   ID:  Reginald [name], DOB May 31, 1924, MRN [id number]  PCP:  Neale Burly, MD  Cardiologist:   Sanda Klein, MD   Chief Complaint  Patient presents with  . Follow-up    History of Present Illness:  Reginald [name] is a 83 y.o. male who presents for symptomatic sinus bradycardia and pacemaker follow-up , paroxysmal atrial fibrillation , history of recurrent subdural hematoma and dementia.  Reginald [name] has continued a steady cognitive decline. His oral intake is poor and he has lost almost 40 pounds since I last saw him. He eats at every meal, but in tiny amounts. From cardiovascular point of view he has not had problems. He denies chest pain or shortness of breath at rest or with activity, but he spends most of the day in a chair or in bed. He has had a few more falls. He does not recognize me today, but does speak in short sentences occasionally.  Interrogation of his pacemaker shows normal device function, but a lot of ventricular pacing. He is almost always in a paced V paced rhythm, rarely a paced V sensed. To a large degree this is due to her settings. Today the underlying rhythm is normal sinus at 60-65 bpm, with a long PR interval. His lower rate limit is programmed at 75 bpm and atrial preference pacing is on to avoid atrial fibrillation. His AV delays are set rather tight at 180/150 ms. His Medtronic Advisa dual chamber device was implanted in 2015 and still has roughly4 years of generator longevity. He had a lot of problems with recurrent paroxysmal atrial fibrillation over the summer, but things have really settled down recently. Invariably he has good ventricular rate control. Recently, atrial therapies have not been successful at prevention of arrhythmia.  Reginald [name] does not have significant coronary disease and has preserved left ventricular systolic function but  has a long-standing history of recurrent paroxysmal atrial fibrillation. After taking warfarin for years, this was discontinued in February of 2016 for bilateral subacute and chronic hemispheric subdural hematomas. He does not have a history of stroke/TIA or other embolic events. Reginald [name] has evidence of progressive dementia. He is extremely hard of hearing. He takes Midodrine for symptomatic orthostatic hypotension.   Past Medical History:  Diagnosis Date  . Anxiety neurosis   . Arrhythmia    afib  . Atrial fib/flutter, transient   . BPH (benign prostatic hyperplasia)   . Coronary artery disease    MINOR  . Hyperlipidemia   . OBS (organic brain syndrome)   . Orthostatic hypotension   . Syncope and collapse   . Vertigo     Past Surgical History:  Procedure Laterality Date  . APPENDECTOMY    . CARDIAC CATHETERIZATION  06/17/97   FALSE/POSITIVE TREADMILL EXERCISE TEST. MILD CAD WITH NL LV FUNCTION. MILD HYPERTENSION. FAMILY HX OF CAD, REMOTE SMOKER. PAST HX OF PAF.  Marland Kitchen CAROTID DUPLEX Bilateral 03/13/01   SM AMT OF NON HEMODYNAMICALLY SIGN PLAQUE IN THE RIGHT CAROTID BULB, <39% STENOSIS.  Marland Kitchen COLONOSCOPY  10/20/2011   Procedure: COLONOSCOPY;  Surgeon: Rogene Houston, MD;  [facility];  Service: Endoscopy;  Laterality: N/A;  . CRANIOTOMY N/A 02/15/2013   Procedure: CRANIOTOMY HEMATOMA EVACUATION SUBDURAL;  Surgeon: Hosie Spangle, MD;  Location: [facility];  Service: Neurosurgery;  Laterality: N/A;  . DOPPLER ECHOCARDIOGRAPHY N/A 02/01/12   LV CAVITY SIZE IS NORMAL. MILD CONCENTRIC HYPERTROPHY. EF 55-60%. PARAMETERS ARE CONSISTANT WITH ABNORMAL LV RELAXATION.(GRADE 1 DIASTOLIC DYSFUNCTION). MR. LEFT ATRIUM MODERATELY DILATED. RIGHT ATRIUM MILDLY DILATED. ATRIAL SEPTUM NORMAL. ATRIAL PACED RHYTHM WITH INTACT A-V CONDUCTION.  Marland Kitchen enrhythm generator     implant of a medtronic generator  . interrogation     atrial and ventricular  . NUCLEAR STRESS TEST N/A 03/18/09   NORMAL  PATTERN OF PERFUSION IN ALL REGIONS. LEFT VENTRICULAR SIZE IS NORMAL. NO EVID OF INDUCIBLE ISCHEMIA. EF 77%. NORMAL MYOCARDIAL PERFUSION STUDY.  Marland Kitchen PACEMAKER GENERATOR CHANGE N/A 07/24/2013   Procedure: PACEMAKER GENERATOR CHANGE;  Surgeon: Sanda Klein, MD;  Location: [facility] CATH LAB;  Service: Cardiovascular;  Laterality: N/A;  . PULSE GENERATOR IMPLANT      Outpatient Medications Prior to Visit  Medication Sig Dispense Refill  . amiodarone (PACERONE) 200 MG tablet TAKE 1 TABLET BY MOUTH ONCE DAILY.--MAKE APPT FOR FURTHER REFILLS. 30 tablet 0  . donepezil (ARICEPT) 10 MG tablet Take 10 mg by mouth daily.     Marland Kitchen latanoprost (XALATAN) 0.005 % ophthalmic solution Place 1 drop into the right eye at bedtime.    Marland Kitchen levothyroxine (SYNTHROID, LEVOTHROID) 88 MCG tablet Take 1 tablet by mouth daily.    . midodrine (PROAMATINE) 5 MG tablet Take 1 tablet (5 mg total) by mouth 2 (two) times daily with a meal. 60 tablet 3  . nitroGLYCERIN (NITROSTAT) 0.4 MG SL tablet Place 0.4 mg under the tongue every 5 (five) minutes as needed. For chest pain.    Marland Kitchen OLANZapine (ZYPREXA) 5 MG tablet Take 5 mg by mouth daily.    . Vitamin D, Cholecalciferol, 1000 UNITS TABS Take 1,000 Units by mouth daily.    Marland Kitchen amiodarone (PACERONE) 200 MG tablet TAKE 1 TABLET BY MOUTH ONCE DAILY. PATIENT NEEDS TO KEEP APPT AS SCHEDULED (Patient not taking: Reported on 08/27/2016) 30 tablet 0  . aspirin EC 81 MG tablet Take 1 tablet (81 mg total) by mouth daily. (Patient not taking: Reported on 08/27/2016) 90 tablet 3  . Melatonin 10 MG TABS Take 10 mg by mouth at bedtime.    . Multiple Vitamins-Minerals (MULTIVITAMIN WITH MINERALS) tablet Take 1 tablet by mouth daily.     No facility-administered medications prior to visit.      Allergies:   Patient has no known allergies.   Social History   Social History  . Marital status: Married    Spouse name: N/A  . Number of children: N/A  . Years of education: N/A   Social History Main Topics    . Smoking status: Never Smoker  . Smokeless tobacco: Never Used  . Alcohol use No  . Drug use: No  . Sexual activity: Not Asked   Other Topics Concern  . None   Social History Narrative  . None     Family History:  The patient's family history includes Cancer in his mother and sister; Heart disease in his father.   ROS:   Please see the history of present illness.    ROS All other systems reviewed and are negative.   PHYSICAL EXAM:   VS:  BP 97/66 (BP Location: Right Arm, Patient Position: Sitting, Cuff Size: Normal)   Pulse 77   Wt 127 lb (57.6 kg)   BMI 20.50 kg/m     General: Alert, not oriented, no distress Head: no evidence of trauma, PERRL, EOMI, no exophtalmos or lid  lag, no myxedema, no xanthelasma; normal ears, nose and oropharynx Neck: normal jugular venous pulsations and no hepatojugular reflux; brisk carotid pulses without delay and no carotid bruits Chest: clear to auscultation, no signs of consolidation by percussion or palpation, normal fremitus, symmetrical and full respiratory excursions Cardiovascular: normal position and quality of the apical impulse, regular rhythm, normal first and second heart sounds, no murmurs, rubs or gallops. Healthy pacemaker site Abdomen: no tenderness or distention, no masses by palpation, no abnormal pulsatility or arterial bruits, normal bowel sounds, no hepatosplenomegaly Extremities: no clubbing, cyanosis or edema; 2+ radial, ulnar and brachial pulses bilaterally; 2+ right femoral, posterior tibial and dorsalis pedis pulses; 2+ left femoral, posterior tibial and dorsalis pedis pulses; no subclavian or femoral bruits Neurological: grossly nonfocal. Very poor short-term memory Psych: Appears to be in a good mood  Wt Readings from Last 3 Encounters:  08/27/16 127 lb (57.6 kg)  03/04/15 163 lb (73.9 kg)  06/26/14 161 lb (73 kg)      Studies/Labs Reviewed:   EKG:  EKG is ordered today.  The ekg ordered today demonstrates AV  sequential pacing, QTC 514 ms  Recent Labs: 08/27/2016: ALT 15; BUN 21; Creatinine, Ser 1.28; Potassium 4.4; Sodium 145; TSH 0.927   Lipid Panel No results found for: CHOL, TRIG, HDL, CHOLHDL, VLDL, LDLCALC, LDLDIRECT   ASSESSMENT:    1. Paroxysmal atrial fibrillation (HCC)   2. Essential hypertension   3. Symptomatic sinus bradycardia   4. Cardiac pacemaker in situ      PLAN:  In order of problems listed above:  1. Paroxysmal atrial flutter and fibrillation: continue amiodarone for rhythm and rate control (needs LFTs and TSH every 6 mos). He does not tolerate conventional AV blocking agents due to low BP, severe orthostatic hypotension and frequent falls 2. Orthostatic hypotension:  Fair control on Midodrine in current dose. 3. Falls/ subdural hematoma: not a candidate for anticoagulation. 4. PPM:  Normal pacemaker function, remote Carelink Q3 mos, office visit yearly. Atrial therapies did not seem to be working and they were turned off today. Also reduced the lower rate limit from 75-60 bpm and reduced the upper track/sense rate 215 bpm. Lengthening the AV delay (both paced and sensed) to 350 ms. MVP on. We'll try to see if we can minimize the need for pacing. 5. SSS with tachycardia-bradycardia syndrome     Medication Adjustments/Labs and Tests Ordered: Current medicines are reviewed at length with the patient today.  Concerns regarding medicines are outlined above.  Medication changes, Labs and Tests ordered today are listed in the Patient Instructions below. Patient Instructions  Dr Sallyanne Kuster recommends that you continue on your current medications as directed. Please refer to the Current Medication list given to you today.  Your physician recommends that you return for lab work TODAY.  Remote monitoring is used to monitor your Pacemaker or ICD from home. This monitoring reduces the number of office visits required to check your device to one time per year. It allows [place] to keep  an eye on the functioning of your device to ensure it is working properly. You are scheduled for a device check from home on Monday, November 26th, 2018. You may send your transmission at any time that day. If you have a wireless device, the transmission will be sent automatically. After your physician reviews your transmission, you will receive a notification with your next transmission date.  Dr Sallyanne Kuster recommends that you schedule a follow-up appointment in 12 months with a pacemaker  check. You will receive a reminder letter in the mail two months in advance. If you don't receive a letter, please call our office to schedule the follow-up appointment.  If you need a refill on your cardiac medications before your next appointment, please call your pharmacy.      Signed, Sanda Klein, MD  08/29/2016 2:47 PM    [address], [place], [place]  [address] Phone: [phone]; Fax: [phone]

## 2016-08-27 NOTE — Patient Instructions (Addendum)
Dr Sallyanne Kuster recommends that you continue on your current medications as directed. Please refer to the Current Medication list given to you today.  Your physician recommends that you return for lab work TODAY.  Remote monitoring is used to monitor your Pacemaker or ICD from home. This monitoring reduces the number of office visits required to check your device to one time per year. It allows [place] to keep an eye on the functioning of your device to ensure it is working properly. You are scheduled for a device check from home on Monday, November 26th, 2018. You may send your transmission at any time that day. If you have a wireless device, the transmission will be sent automatically. After your physician reviews your transmission, you will receive a notification with your next transmission date.  Dr Sallyanne Kuster recommends that you schedule a follow-up appointment in 12 months with a pacemaker check. You will receive a reminder letter in the mail two months in advance. If you don't receive a letter, please call our office to schedule the follow-up appointment.  If you need a refill on your cardiac medications before your next appointment, please call your pharmacy.

## 2016-08-28 LAB — COMPREHENSIVE METABOLIC PANEL
A/G RATIO: 1.4 (ref 1.2–2.2)
ALBUMIN: 3.7 g/dL (ref 3.2–4.6)
ALT: 15 IU/L (ref 0–44)
AST: 21 IU/L (ref 0–40)
Alkaline Phosphatase: 67 IU/L (ref 39–117)
BUN / CREAT RATIO: 16 (ref 10–24)
BUN: 21 mg/dL (ref 10–36)
Bilirubin Total: 0.7 mg/dL (ref 0.0–1.2)
CALCIUM: 8.9 mg/dL (ref 8.6–10.2)
CO2: 24 mmol/L (ref 20–29)
CREATININE: 1.28 mg/dL — AB (ref 0.76–1.27)
Chloride: 108 mmol/L — ABNORMAL HIGH (ref 96–106)
GFR, EST AFRICAN AMERICAN: 57 mL/min/{1.73_m2} — AB (ref >59)
GFR, EST NON AFRICAN AMERICAN: 49 mL/min/{1.73_m2} — AB (ref >59)
GLOBULIN, TOTAL: 2.7 g/dL (ref 1.5–4.5)
Glucose: 86 mg/dL (ref 65–99)
Potassium: 4.4 mmol/L (ref 3.5–5.2)
SODIUM: 145 mmol/L — AB (ref 134–144)
TOTAL PROTEIN: 6.4 g/dL (ref 6.0–8.5)

## 2016-08-28 LAB — TSH: TSH: 0.927 u[IU]/mL (ref 0.450–4.500)

## 2016-09-01 ENCOUNTER — Telehealth: Payer: Self-pay | Admitting: Cardiovascular Disease

## 2016-09-01 ENCOUNTER — Other Ambulatory Visit: Payer: Self-pay

## 2016-09-01 DIAGNOSIS — Z79899 Other long term (current) drug therapy: Secondary | ICD-10-CM

## 2016-09-01 DIAGNOSIS — I10 Essential (primary) hypertension: Secondary | ICD-10-CM

## 2016-09-01 NOTE — Telephone Encounter (Signed)
See result note.  

## 2016-09-01 NOTE — Telephone Encounter (Signed)
New Message     Pt is returning your call for test results

## 2016-09-15 DIAGNOSIS — I10 Essential (primary) hypertension: Secondary | ICD-10-CM | POA: Diagnosis not present

## 2016-09-15 DIAGNOSIS — G909 Disorder of the autonomic nervous system, unspecified: Secondary | ICD-10-CM | POA: Diagnosis not present

## 2016-10-04 ENCOUNTER — Other Ambulatory Visit: Payer: Self-pay | Admitting: Cardiovascular Disease

## 2016-10-06 DIAGNOSIS — Z79899 Other long term (current) drug therapy: Secondary | ICD-10-CM | POA: Diagnosis not present

## 2016-10-06 DIAGNOSIS — E7800 Pure hypercholesterolemia, unspecified: Secondary | ICD-10-CM | POA: Diagnosis not present

## 2016-10-06 DIAGNOSIS — E039 Hypothyroidism, unspecified: Secondary | ICD-10-CM | POA: Diagnosis not present

## 2016-10-06 DIAGNOSIS — R531 Weakness: Secondary | ICD-10-CM | POA: Diagnosis not present

## 2016-10-06 DIAGNOSIS — Z87891 Personal history of nicotine dependence: Secondary | ICD-10-CM | POA: Diagnosis not present

## 2016-10-06 DIAGNOSIS — R0789 Other chest pain: Secondary | ICD-10-CM | POA: Diagnosis not present

## 2016-10-06 DIAGNOSIS — J069 Acute upper respiratory infection, unspecified: Secondary | ICD-10-CM | POA: Diagnosis not present

## 2016-10-15 DIAGNOSIS — G909 Disorder of the autonomic nervous system, unspecified: Secondary | ICD-10-CM | POA: Diagnosis not present

## 2016-10-15 DIAGNOSIS — I10 Essential (primary) hypertension: Secondary | ICD-10-CM | POA: Diagnosis not present

## 2016-10-15 DIAGNOSIS — N182 Chronic kidney disease, stage 2 (mild): Secondary | ICD-10-CM | POA: Diagnosis not present

## 2016-10-15 DIAGNOSIS — E038 Other specified hypothyroidism: Secondary | ICD-10-CM | POA: Diagnosis not present

## 2016-11-15 DIAGNOSIS — G909 Disorder of the autonomic nervous system, unspecified: Secondary | ICD-10-CM | POA: Diagnosis not present

## 2016-11-15 DIAGNOSIS — I10 Essential (primary) hypertension: Secondary | ICD-10-CM | POA: Diagnosis not present

## 2016-11-29 ENCOUNTER — Encounter: Payer: Medicare Other | Admitting: *Deleted

## 2016-11-29 ENCOUNTER — Telehealth: Payer: Self-pay | Admitting: Cardiology

## 2016-11-29 NOTE — Telephone Encounter (Signed)
LMOVM reminding pt to send remote transmission.   

## 2016-12-02 ENCOUNTER — Encounter: Payer: Self-pay | Admitting: Cardiology

## 2016-12-15 DIAGNOSIS — G909 Disorder of the autonomic nervous system, unspecified: Secondary | ICD-10-CM | POA: Diagnosis not present

## 2016-12-15 DIAGNOSIS — I10 Essential (primary) hypertension: Secondary | ICD-10-CM | POA: Diagnosis not present

## 2016-12-21 LAB — CUP PACEART INCLINIC DEVICE CHECK
Brady Statistic AS VP Percent: 11.9 %
Brady Statistic RA Percent Paced: 84.09 %
Implantable Lead Implant Date: 20010315
Implantable Lead Location: 753859
Implantable Lead Model: 4592
Implantable Lead Model: 5092
Lead Channel Impedance Value: 285 Ohm
Lead Channel Pacing Threshold Amplitude: 0.625 V
Lead Channel Pacing Threshold Pulse Width: 0.4 ms
Lead Channel Pacing Threshold Pulse Width: 0.4 ms
Lead Channel Sensing Intrinsic Amplitude: 1.75 mV
Lead Channel Sensing Intrinsic Amplitude: 2.375 mV
Lead Channel Setting Pacing Pulse Width: 0.4 ms
MDC IDC LEAD IMPLANT DT: 20010315
MDC IDC LEAD LOCATION: 753860
MDC IDC MSMT BATTERY REMAINING LONGEVITY: 52 mo
MDC IDC MSMT BATTERY VOLTAGE: 2.99 V
MDC IDC MSMT LEADCHNL RA IMPEDANCE VALUE: 228 Ohm
MDC IDC MSMT LEADCHNL RA SENSING INTR AMPL: 1.75 mV
MDC IDC MSMT LEADCHNL RV IMPEDANCE VALUE: 456 Ohm
MDC IDC MSMT LEADCHNL RV IMPEDANCE VALUE: 494 Ohm
MDC IDC MSMT LEADCHNL RV PACING THRESHOLD AMPLITUDE: 1 V
MDC IDC MSMT LEADCHNL RV SENSING INTR AMPL: 2.375 mV
MDC IDC PG IMPLANT DT: 20150721
MDC IDC SESS DTM: 20180824185213
MDC IDC SET LEADCHNL RA PACING AMPLITUDE: 2 V
MDC IDC SET LEADCHNL RV PACING AMPLITUDE: 2 V
MDC IDC SET LEADCHNL RV SENSING SENSITIVITY: 2 mV
MDC IDC STAT BRADY AP VP PERCENT: 56.37 %
MDC IDC STAT BRADY AP VS PERCENT: 30.97 %
MDC IDC STAT BRADY AS VS PERCENT: 0.75 %
MDC IDC STAT BRADY RV PERCENT PACED: 68.31 %

## 2016-12-30 ENCOUNTER — Other Ambulatory Visit: Payer: Self-pay

## 2017-01-12 DIAGNOSIS — E038 Other specified hypothyroidism: Secondary | ICD-10-CM | POA: Diagnosis not present

## 2017-01-12 DIAGNOSIS — N182 Chronic kidney disease, stage 2 (mild): Secondary | ICD-10-CM | POA: Diagnosis not present

## 2017-01-12 DIAGNOSIS — Z1389 Encounter for screening for other disorder: Secondary | ICD-10-CM | POA: Diagnosis not present

## 2017-01-12 DIAGNOSIS — I10 Essential (primary) hypertension: Secondary | ICD-10-CM | POA: Diagnosis not present

## 2017-01-12 DIAGNOSIS — Z0000 Encounter for general adult medical examination without abnormal findings: Secondary | ICD-10-CM | POA: Diagnosis not present

## 2017-01-14 DIAGNOSIS — I10 Essential (primary) hypertension: Secondary | ICD-10-CM | POA: Diagnosis not present

## 2017-01-14 DIAGNOSIS — G309 Alzheimer's disease, unspecified: Secondary | ICD-10-CM | POA: Diagnosis not present

## 2017-01-14 DIAGNOSIS — E039 Hypothyroidism, unspecified: Secondary | ICD-10-CM | POA: Diagnosis not present

## 2017-01-14 DIAGNOSIS — Z8249 Family history of ischemic heart disease and other diseases of the circulatory system: Secondary | ICD-10-CM | POA: Diagnosis not present

## 2017-01-14 DIAGNOSIS — E860 Dehydration: Secondary | ICD-10-CM | POA: Diagnosis not present

## 2017-01-14 DIAGNOSIS — E440 Moderate protein-calorie malnutrition: Secondary | ICD-10-CM | POA: Diagnosis not present

## 2017-01-14 DIAGNOSIS — E559 Vitamin D deficiency, unspecified: Secondary | ICD-10-CM | POA: Diagnosis not present

## 2017-01-14 DIAGNOSIS — N178 Other acute kidney failure: Secondary | ICD-10-CM | POA: Diagnosis not present

## 2017-01-14 DIAGNOSIS — Z803 Family history of malignant neoplasm of breast: Secondary | ICD-10-CM | POA: Diagnosis not present

## 2017-01-14 DIAGNOSIS — N390 Urinary tract infection, site not specified: Secondary | ICD-10-CM | POA: Diagnosis not present

## 2017-01-14 DIAGNOSIS — G909 Disorder of the autonomic nervous system, unspecified: Secondary | ICD-10-CM | POA: Diagnosis not present

## 2017-01-14 DIAGNOSIS — E870 Hyperosmolality and hypernatremia: Secondary | ICD-10-CM | POA: Diagnosis not present

## 2017-01-14 DIAGNOSIS — E785 Hyperlipidemia, unspecified: Secondary | ICD-10-CM | POA: Diagnosis not present

## 2017-01-14 DIAGNOSIS — I482 Chronic atrial fibrillation: Secondary | ICD-10-CM | POA: Diagnosis not present

## 2017-01-14 DIAGNOSIS — G908 Other disorders of autonomic nervous system: Secondary | ICD-10-CM | POA: Diagnosis not present

## 2017-01-14 DIAGNOSIS — N179 Acute kidney failure, unspecified: Secondary | ICD-10-CM | POA: Diagnosis not present

## 2017-01-14 DIAGNOSIS — J9811 Atelectasis: Secondary | ICD-10-CM | POA: Diagnosis not present

## 2017-01-14 DIAGNOSIS — Z79899 Other long term (current) drug therapy: Secondary | ICD-10-CM | POA: Diagnosis not present

## 2017-01-26 DIAGNOSIS — E870 Hyperosmolality and hypernatremia: Secondary | ICD-10-CM | POA: Diagnosis not present

## 2017-03-22 DIAGNOSIS — L89812 Pressure ulcer of head, stage 2: Secondary | ICD-10-CM | POA: Diagnosis not present

## 2017-03-26 DIAGNOSIS — Z87891 Personal history of nicotine dependence: Secondary | ICD-10-CM | POA: Diagnosis not present

## 2017-03-26 DIAGNOSIS — L89150 Pressure ulcer of sacral region, unstageable: Secondary | ICD-10-CM | POA: Diagnosis not present

## 2017-03-26 DIAGNOSIS — Z4800 Encounter for change or removal of nonsurgical wound dressing: Secondary | ICD-10-CM | POA: Diagnosis not present

## 2017-03-26 DIAGNOSIS — Z950 Presence of cardiac pacemaker: Secondary | ICD-10-CM | POA: Diagnosis not present

## 2017-03-26 DIAGNOSIS — L89312 Pressure ulcer of right buttock, stage 2: Secondary | ICD-10-CM | POA: Diagnosis not present

## 2017-03-28 ENCOUNTER — Other Ambulatory Visit: Payer: Self-pay | Admitting: Cardiovascular Disease

## 2017-03-29 DIAGNOSIS — L89150 Pressure ulcer of sacral region, unstageable: Secondary | ICD-10-CM | POA: Diagnosis not present

## 2017-03-30 DIAGNOSIS — Z87891 Personal history of nicotine dependence: Secondary | ICD-10-CM | POA: Diagnosis not present

## 2017-03-30 DIAGNOSIS — L89150 Pressure ulcer of sacral region, unstageable: Secondary | ICD-10-CM | POA: Diagnosis not present

## 2017-03-30 DIAGNOSIS — Z950 Presence of cardiac pacemaker: Secondary | ICD-10-CM | POA: Diagnosis not present

## 2017-03-30 DIAGNOSIS — L89312 Pressure ulcer of right buttock, stage 2: Secondary | ICD-10-CM | POA: Diagnosis not present

## 2017-03-30 DIAGNOSIS — Z4800 Encounter for change or removal of nonsurgical wound dressing: Secondary | ICD-10-CM | POA: Diagnosis not present

## 2017-03-31 DIAGNOSIS — R269 Unspecified abnormalities of gait and mobility: Secondary | ICD-10-CM | POA: Diagnosis not present

## 2017-03-31 DIAGNOSIS — G909 Disorder of the autonomic nervous system, unspecified: Secondary | ICD-10-CM | POA: Diagnosis not present

## 2017-03-31 DIAGNOSIS — Z9181 History of falling: Secondary | ICD-10-CM | POA: Diagnosis not present

## 2017-03-31 DIAGNOSIS — I10 Essential (primary) hypertension: Secondary | ICD-10-CM | POA: Diagnosis not present

## 2017-03-31 DIAGNOSIS — M6281 Muscle weakness (generalized): Secondary | ICD-10-CM | POA: Diagnosis not present

## 2017-04-01 DIAGNOSIS — L89312 Pressure ulcer of right buttock, stage 2: Secondary | ICD-10-CM | POA: Diagnosis not present

## 2017-04-01 DIAGNOSIS — L89150 Pressure ulcer of sacral region, unstageable: Secondary | ICD-10-CM | POA: Diagnosis not present

## 2017-04-01 DIAGNOSIS — Z950 Presence of cardiac pacemaker: Secondary | ICD-10-CM | POA: Diagnosis not present

## 2017-04-01 DIAGNOSIS — Z4800 Encounter for change or removal of nonsurgical wound dressing: Secondary | ICD-10-CM | POA: Diagnosis not present

## 2017-04-01 DIAGNOSIS — Z87891 Personal history of nicotine dependence: Secondary | ICD-10-CM | POA: Diagnosis not present

## 2017-04-04 DIAGNOSIS — Z4800 Encounter for change or removal of nonsurgical wound dressing: Secondary | ICD-10-CM | POA: Diagnosis not present

## 2017-04-04 DIAGNOSIS — L89312 Pressure ulcer of right buttock, stage 2: Secondary | ICD-10-CM | POA: Diagnosis not present

## 2017-04-04 DIAGNOSIS — Z950 Presence of cardiac pacemaker: Secondary | ICD-10-CM | POA: Diagnosis not present

## 2017-04-04 DIAGNOSIS — Z87891 Personal history of nicotine dependence: Secondary | ICD-10-CM | POA: Diagnosis not present

## 2017-04-04 DIAGNOSIS — L89150 Pressure ulcer of sacral region, unstageable: Secondary | ICD-10-CM | POA: Diagnosis not present

## 2017-04-07 DIAGNOSIS — Z950 Presence of cardiac pacemaker: Secondary | ICD-10-CM | POA: Diagnosis not present

## 2017-04-07 DIAGNOSIS — L89150 Pressure ulcer of sacral region, unstageable: Secondary | ICD-10-CM | POA: Diagnosis not present

## 2017-04-07 DIAGNOSIS — L89312 Pressure ulcer of right buttock, stage 2: Secondary | ICD-10-CM | POA: Diagnosis not present

## 2017-04-07 DIAGNOSIS — Z87891 Personal history of nicotine dependence: Secondary | ICD-10-CM | POA: Diagnosis not present

## 2017-04-07 DIAGNOSIS — Z4800 Encounter for change or removal of nonsurgical wound dressing: Secondary | ICD-10-CM | POA: Diagnosis not present

## 2017-04-12 DIAGNOSIS — H4050X0 Glaucoma secondary to other eye disorders, unspecified eye, stage unspecified: Secondary | ICD-10-CM | POA: Diagnosis not present

## 2017-04-12 DIAGNOSIS — H35363 Drusen (degenerative) of macula, bilateral: Secondary | ICD-10-CM | POA: Diagnosis not present

## 2017-04-12 DIAGNOSIS — H34831 Tributary (branch) retinal vein occlusion, right eye, with macular edema: Secondary | ICD-10-CM | POA: Diagnosis not present

## 2017-04-12 DIAGNOSIS — H353131 Nonexudative age-related macular degeneration, bilateral, early dry stage: Secondary | ICD-10-CM | POA: Diagnosis not present

## 2017-04-13 ENCOUNTER — Ambulatory Visit (HOSPITAL_COMMUNITY): Payer: Medicare Other | Attending: Internal Medicine

## 2017-04-13 ENCOUNTER — Other Ambulatory Visit: Payer: Self-pay

## 2017-04-13 ENCOUNTER — Encounter (HOSPITAL_COMMUNITY): Payer: Self-pay

## 2017-04-13 DIAGNOSIS — L89154 Pressure ulcer of sacral region, stage 4: Secondary | ICD-10-CM | POA: Insufficient documentation

## 2017-04-13 DIAGNOSIS — L89150 Pressure ulcer of sacral region, unstageable: Secondary | ICD-10-CM | POA: Diagnosis not present

## 2017-04-13 DIAGNOSIS — R2689 Other abnormalities of gait and mobility: Secondary | ICD-10-CM | POA: Diagnosis not present

## 2017-04-13 NOTE — Therapy (Signed)
[name], [place], [address] Phone: [phone]   Fax:  [phone]  Wound Care Evaluation  Patient Details  Name: Reginald [name] MRN: [id number] Date of Birth: 26-Aug-1924 Referring Provider: Neale Burly, MD   Encounter Date: 04/13/2017  PT End of Session - 04/13/17 1446    Visit Number  1    Number of Visits  25    Date for PT Re-Evaluation  06/08/17 mini-reeval on 05/11/17    Authorization Type  [employer]    Authorization Time Period  04/13/17 - 06/08/17    Authorization - Visit Number  1    Authorization - Number of Visits  10    PT Start Time  1118    PT Stop Time  1220    PT Time Calculation (min)  62 min    Activity Tolerance  Patient tolerated treatment well    Behavior During Therapy  [facility] for tasks assessed/performed       Past Medical History:  Diagnosis Date  . Anxiety neurosis   . Arrhythmia    afib  . Atrial fib/flutter, transient   . BPH (benign prostatic hyperplasia)   . Coronary artery disease    MINOR  . Hyperlipidemia   . OBS (organic brain syndrome)   . Orthostatic hypotension   . Syncope and collapse   . Vertigo     Past Surgical History:  Procedure Laterality Date  . APPENDECTOMY    . CARDIAC CATHETERIZATION  06/17/97   FALSE/POSITIVE TREADMILL EXERCISE TEST. MILD CAD WITH NL LV FUNCTION. MILD HYPERTENSION. FAMILY HX OF CAD, REMOTE SMOKER. PAST HX OF PAF.  Marland Kitchen CAROTID DUPLEX Bilateral 03/13/01   SM AMT OF NON HEMODYNAMICALLY SIGN PLAQUE IN THE RIGHT CAROTID BULB, <39% STENOSIS.  Marland Kitchen COLONOSCOPY  10/20/2011   Procedure: COLONOSCOPY;  Surgeon: Reginald Houston, MD;  Location[facility];  Service: Endoscopy;  Laterality: N/A;  . CRANIOTOMY N/A 02/15/2013   Procedure: CRANIOTOMY HEMATOMA EVACUATION SUBDURAL;  Surgeon: Reginald Spangle, MD;  Location: [facility];  Service: Neurosurgery;  Laterality: N/A;  . DOPPLER ECHOCARDIOGRAPHY N/A 02/01/12   LV CAVITY SIZE IS  NORMAL. MILD CONCENTRIC HYPERTROPHY. EF 55-60%. PARAMETERS ARE CONSISTANT WITH ABNORMAL LV RELAXATION.(GRADE 1 DIASTOLIC DYSFUNCTION). MR. LEFT ATRIUM MODERATELY DILATED. RIGHT ATRIUM MILDLY DILATED. ATRIAL SEPTUM NORMAL. ATRIAL PACED RHYTHM WITH INTACT A-V CONDUCTION.  Marland Kitchen enrhythm generator     implant of a medtronic generator  . interrogation     atrial and ventricular  . NUCLEAR STRESS TEST N/A 03/18/09   NORMAL PATTERN OF PERFUSION IN ALL REGIONS. LEFT VENTRICULAR SIZE IS NORMAL. NO EVID OF INDUCIBLE ISCHEMIA. EF 77%. NORMAL MYOCARDIAL PERFUSION STUDY.  Marland Kitchen PACEMAKER GENERATOR CHANGE N/A 07/24/2013   Procedure: PACEMAKER GENERATOR CHANGE;  Surgeon: Reginald Klein, MD;  Location: [facility] CATH LAB;  Service: Cardiovascular;  Laterality: N/A;  . PULSE GENERATOR IMPLANT      There were no vitals filed for this visit.    [facility] PT Assessment - 04/13/17 0001      Assessment   Medical Diagnosis  Sacral Pressure Wound    Referring Provider  Reginald Burly, MD      Home Environment   Additional Comments  Patient lives with his daughter and has a caregiver who provides assistance to him M-R 8am-5pm when patient's daughter is not home. Patient's daughter cares for him on Friday's and on the Weekend.      Prior Function  Level of Independence  Needs assistance with transfers;Other (comment) pt requires max/total assist with transfers/bed mobility      Cognition   Overall Cognitive Status  History of cognitive impairments - at baseline dementia at baseline      Observation/Other Assessments   Observations  Frail elderly man with frail and thin skin, flexed posture in sitting and sideyling    Skin Integrity  severe skin breakdown resulting in full thickness sacral pressure wound; frail thin skin generally; no visible bruises or wounds elsewhere      Wound Therapy - 04/13/17 1515    Subjective  Patient is unreliable historian and speaks however cannot answer simple questions clearly secondary to  dementia. His daughter and caregiver are present to provide subjective history. Family/caregiver report he requires max/total assistance for all mobility and transfers and they perform pressure relief turns but did not specify how frequently. They have been using a pillow as his wheelchair cushion and do not have a wheelchair cushion currently. They report he has a hospital bed at home with an air mattress for pressure relief. Family/caregiver report that he has been eating well with meals, patient is frail in stature. Patient daughter reports that "chris" his caregiver is with him from 8-5 Monday-Thursday and that she cares for him alone on Friday-Sunday. She reports he spends all day in bed on Friday-Sunday but does sit up for meals. Daughter/caregiver state the patient has been living at home and declining in function/mobility since her returned home from a nursing facility ~ 1 year ago. He was ambulatory with a RW at the nursing home but then developed gangrene in his foot and his mobility decreased from that point forward.   Patient and Family Stated Goals  Wound to heal and patient to not be in pain    Prior Treatments  home RN for 2 weeks, ended last week and recommended wound care referral    Pain Scale  Faces    Faces Pain Scale  Hurts a little bit    Pain Type  Acute pain    Pain Location  Sacrum    Pain Orientation  Posterior;Mid    Pain Descriptors / Indicators  Other (Comment) patient did not verbalize    Patients Stated Pain Goal  0    Pain Intervention(s)  Emotional support    Evaluation and Treatment Procedures Explained to Patient/Family  Yes    Evaluation and Treatment Procedures  Patient unable to consent due to mental status family and caregiver consent to treatment    Pressure Injury Properties Date First Assessed: 04/13/17 Time First Assessed: 7322 Location: Sacrum Location Orientation: Posterior;Mid Staging: Stage IV - Full thickness tissue loss with exposed bone, tendon or  muscle. Wound Description (Comments): large open wound with yellow/greenish slough present in openign and on wound bed and adheared to wound margins Present on Admission: Yes   Dressing Type  Abdominal pads;Silver hydrofiber;Gauze (Comment);Adhesive strips saline gauze (1 strip) to pack wound; petrolleum jelly    Dressing  Changed;Old drainage (marked)    Dressing Change Frequency  PRN    State of Healing  Non-healing    Site / Wound Assessment  Granulation tissue;Yellow;Red    % Wound base Red or Granulating  55%    % Wound base Yellow/Fibrinous Exudate  45%    Peri-wound Assessment  Erythema (non-blanchable)    Wound Length (cm)  3 cm    Wound Width (cm)  2.8 cm    Wound Depth (cm)  1 cm  Wound Surface Area (cm^2)  8.4 cm^2    Wound Volume (cm^3)  8.4 cm^3    Undermining (cm)  Undermining is 1.0 cm in depth extending from 1:00 to 4:00; 0.7 cm in depth extending from 4:00 to 6:00; 0.6 cm in depth extending from 6:00 to 8:00; 1.5 cm in depth extending from 8:00 to 10:00; and 2.2 cm in depth extending from 10:00 to 1:00    Margins  Epibole (rolled edges)    Drainage Amount  Copious    Drainage Description  Purulent;Green;Odor    Treatment  Cleansed;Debridement (Selective);Hydrotherapy (Pulse lavage);Packing (Saline gauze)    Pressure Injury Properties Date First Assessed: 04/13/17 Time First Assessed: 1124 Location: Sacrum Location Orientation: Left;Mid Staging: Deep Tissue Injury - Purple or maroon localized area of discolored intact skin or blood-filled blister due to damage of underlying soft tissue from pressure and/or shear. Wound Description (Comments): connected to and located on left/slightly supirior board of central sacral wound, located between 8 and 12 o'clock Present on Admission: Yes   Dressing Type  Abdominal pads;Silver hydrofiber;Gauze (Comment);Adhesive strips petrolleum jelly    Dressing  Changed;Old drainage (marked)    Dressing Change Frequency  PRN    State of Healing   Non-healing    Site / Wound Assessment  Dry;Purple;Friable    % Wound base Red or Granulating  0%    % Wound base Yellow/Fibrinous Exudate  40%    % Wound base Other/Granulation Tissue (Comment)  60%    Peri-wound Assessment  -- purple/maroon coloration of closed pressure sore    Wound Length (cm)  2.6 cm    Wound Width (cm)  1.9 cm    Wound Depth (cm)  0 cm    Wound Surface Area (cm^2)  4.94 cm^2    Wound Volume (cm^3)  0 cm^3    Margins  Attached edges (approximated)    Drainage Amount  None dry friable yellow    Treatment  Cleansed;Debridement (Selective);Tape changed silver hydrofiber, petrolleum jelly    Pressure Injury Properties Date First Assessed: 04/13/17 Time First Assessed: 1124 Location: Sacrum Location Orientation: Right;Distal;Posterior Staging: Unstageable - Full thickness tissue loss in which the base of the ulcer is covered by slough (yellow, tan, gray, green or brown) and/or eschar (tan, brown or black) in the wound bed. Wound Description (Comments): located right and distally to central sacral wound Present on Admission: Yes   Dressing Type  Abdominal pads;Silver hydrofiber;Gauze (Comment);Adhesive strips petrolleum jelly    Dressing  Changed;Old drainage (marked)    Dressing Change Frequency  PRN    State of Healing  Eschar    Site / Wound Assessment  Yellow;Red    % Wound base Red or Granulating  20%    % Wound base Yellow/Fibrinous Exudate  80%    Peri-wound Assessment  Erythema (non-blanchable)    Wound Length (cm)  0.9 cm    Wound Width (cm)  0.4 cm    Wound Depth (cm)  0 cm    Wound Surface Area (cm^2)  0.36 cm^2    Wound Volume (cm^3)  0 cm^3    Drainage Amount  Scant    Drainage Description  Purulent;Odor    Pulsed lavage therapy - wound location  Central sacral wound    Pulsed Lavage with Suction (psi)  300 psi    Pulsed Lavage Tip  Tip with splash shield    Selective Debridement - Location  Central sacral wound base and margins, deep tissue injury to  left and  superior to central wound    Selective Debridement - Tools Used  Forceps;Scissors    Selective Debridement - Tissue Removed  purulent slough and necrotic tissue in wound bed and aroudn margins of wound    Wound Therapy - Clinical Statement  Mr. Osborn present with severe full thickness and deep pressure injuries around sacrum. There are signs of localized infection with redness surrounds wounds beyond 3 cm from wound margins and with purulent odorous slough that is adherent to the wound bed and margins. He will require skilled wound care services to cleanse and debride the wound as well as determine proper dressing to promote optimal healing. Debridement should include with pulsed lavage and selective mechanical debridement, margins should be closely monitored due to significant undermining. He is fully dependent on his caregivers for pressure relief and family/caregiver training should be performed to educate on proper pressure relief turns. He will also benefit from an appropriate wheelchair air cushion to provide pressure relief when sitting up in chair. I have spoken with Dr. Sherrie Sport office and recommended long term antibiotics to treat the infection and they have provided an order to perform a wound culture to order the most appropriate antibiotic.    Wound Therapy - Functional Problem List  decreased mobility, dependent for pressure relief, poor nutritional intake    Factors Delaying/Impairing Wound Healing  Immobility    Hydrotherapy Plan  Debridement;Dressing change;Patient/family education;Pulsatile lavage with suction    Wound Therapy - Frequency  3X / week    Wound Therapy - Current Recommendations  PT    Wound Plan Take wound culture Friday 04/15/17 with written/signed referral from MD to lab at hospital. Inform family that after culture is tested Dr. Sherrie Sport will place an order for the appropriate antibiotic to treat the infection. Measure every Wednesday. Pulsed lavage and pack wound  with saline gauze. Discuss bed mobility and pressure relief schedule for turns (instruct on turns every 1-2 hours max) and educated on wedges to support patient at 45 degrees angle in side lying rather than pillows which flatten over time and do not provide proper support for pressure relief. If referral for [facility] High Profile Single Compartment Cushion has been signed provide it for the patient/family to purchase/order at [place] or [employer]. Educate on proper nutritional supplements and importance of good protein intake to promote optimal wound healing.   Decrease Length/Width/Depth by (cm)  Wound volume/surface area will decrease by 50%  or greater    Decrease Length/Width/Depth - Progress  Goal set today    Improve Drainage Characteristics  Min no signs of local infection with no purulent drainage an no     Improve Drainage Characteristics - Progress  Goal set today    Patient/Family will be able to   Family/caregiver will demonstrate proper inflation and fit technique for wheelchair cushion to provide optimal pressure relief for patient's sacrum while seated in wheelchair.    Patient/Family Instruction Goal - Progress  Goal set today    Additional Wound Therapy Goal  Patient's family/caregiver will verbalize understanding of pressure sensitive areas and reports appropriate safe methods to prevent pressure sores from developing and verbalize understanding of pressure relief recommendations and report pressure relief schedule to improve skin integrity and decrease breakdown.    Additional Wound Therapy Goal - Progress  Goal set today    Goals/treatment plan/discharge plan were made with and agreed upon by patient/family  Yes    Time For Goal Achievement  Other (comment) 2-8 weeks  Wound Therapy - Potential for Goals  Good         Objective measurements completed on examination: See above findings.      PT Education - 04/13/17 1455    Education provided  Yes    Education  Details  Educated on timeline of treatment and importance of frequency of treatment to cleanse and debride wound. Educated on calling the patient's MD to request antibiotics and educated on importance of pressure relief for patient and turning in bed on side. Discussed acquiring an air cushion for pressure relief when patient is in wheelchair.     Person(s) Educated  Patient;Child(ren);Caregiver(s) daughter and caregiver    Methods  Explanation    Comprehension  Verbalized understanding       PT Short Term Goals - 04/13/17 1458      PT SHORT TERM GOAL #1   Title  Patient's family/caregiver will verbalize understanding of pressure sensitive areas and reports appropriate safe methods to prevent pressure sores from developing.     Time  2    Period  Weeks    Status  New    Target Date  05/25/17      PT SHORT TERM GOAL #2   Title  Patient's family/caregiver will verbalize understanding of pressure relief recommendations and report pressure relief schedule to improve skin integrity and decrease breakdown.    Time  2    Period  Weeks    Status  New      PT SHORT TERM GOAL #3   Title  Family/caregiver will demonstrate proper inflation and fit technique for wheelchair cushion to provide optimal pressure relief for patient's sacrum while seated in wheelchair.    Time  4    Period  Weeks    Status  New    Target Date  05/11/17        PT Long Term Goals - 04/13/17 1502      PT LONG TERM GOAL #1   Title  Wound will decreas volume by 50% and there will be no signs of infection surround wound/periwound to show improved tissue health.    Time  8    Period  Weeks    Status  New    Target Date  06/08/17         Plan - 04/13/17 1448    Clinical Impression Statement  see above    Clinical Presentation  Stable    Clinical Decision Making  Moderate    Rehab Potential  Poor    Clinical Impairments Affecting Rehab Potential  decreased mobility.    PT Frequency  3x / week    PT Duration   8 weeks    PT Treatment/Interventions  ADLs/Self Care Home Management;Patient/family education;Manual techniques;Therapeutic activities;Passive range of motion;Other (comment);Taping selective debridement, pulsed lavage, cleansing and dressing wound    PT Next Visit Plan  Take wound culture Friday 04/15/17 with written/signed referral from MD to lab at hospital. Inform family that after culture is tested Dr. Sherrie Sport will place an order for the appropriate antibiotic to treat the infection. Measure every Wednesday. Pulsed lavage and pack wound with saline gauze. Discuss bed mobility and pressure relief schedule for turns (instruct on turns every 1-2 hours max) and educated on wedges to support patient at 45 degrees angle in side lying rather than pillows which flatten over time and do not provide proper support for pressure relief. If referral for [facility] High Profile Single Compartment Cushion has been signed provide it for the  patient/family to purchase/order at [employer] or [employer]. Educate on proper nutritional supplements and importance of good protein intake to promote optimal wound healing.   Consulted and Agree with Plan of Care  Patient       Patient will benefit from skilled therapeutic intervention in order to improve the following deficits and impairments:  Decreased skin integrity, Improper body mechanics, Pain, Decreased mobility  Visit Diagnosis: Pressure injury of sacral region, stage 4 (HCC)  Pressure injury of sacral region, unstageable (HCC)  Other abnormalities of gait and mobility    Problem List Patient Active Problem List   Diagnosis Date Noted  . Symptomatic sinus bradycardia 07/24/2013  . Pacemaker battery depletion 07/17/2013  . Preoperative clearance 02/15/2013  . Subdural hematoma ([place]) 02/13/2013  . Falls frequently 11/15/2012  . Long term (current) use of anticoagulants 04/21/2012  . Hx of adenomatous colonic polyps 02/19/2012  . Constipation  10/07/2011  . UNSPECIFIED HYPOTHYROIDISM 06/18/2009  . Essential hypertension 06/18/2009  . ATRIAL FIBRILLATION 06/18/2009  . ORTHOSTATIC HYPOTENSION 06/18/2009  . SYNCOPE AND COLLAPSE 06/18/2009  . Cardiac pacemaker in situ 06/18/2009     [name], PT, DPT Physical Therapist with [facility]  04/13/2017 6:38 PM     [facility] [address] [place], [place], [address] Phone: [phone]   Fax:  [phone]  Name: Reginald [name] MRN: [id number] Date of Birth: 07/15/1924

## 2017-04-15 ENCOUNTER — Other Ambulatory Visit (HOSPITAL_COMMUNITY)
Admission: RE | Admit: 2017-04-15 | Discharge: 2017-04-15 | Disposition: A | Discharge status: Home / Self Care | Payer: Medicare Other | Source: Other Acute Inpatient Hospital | Attending: Internal Medicine | Admitting: Internal Medicine

## 2017-04-15 ENCOUNTER — Ambulatory Visit (HOSPITAL_COMMUNITY): Payer: Medicare Other

## 2017-04-15 ENCOUNTER — Encounter (HOSPITAL_COMMUNITY): Payer: Self-pay

## 2017-04-15 DIAGNOSIS — R2689 Other abnormalities of gait and mobility: Secondary | ICD-10-CM | POA: Diagnosis not present

## 2017-04-15 DIAGNOSIS — L89154 Pressure ulcer of sacral region, stage 4: Secondary | ICD-10-CM

## 2017-04-15 DIAGNOSIS — L89150 Pressure ulcer of sacral region, unstageable: Secondary | ICD-10-CM | POA: Diagnosis not present

## 2017-04-15 DIAGNOSIS — L89159 Pressure ulcer of sacral region, unspecified stage: Secondary | ICD-10-CM | POA: Diagnosis not present

## 2017-04-15 NOTE — Therapy (Signed)
[place], [place], [address] Phone: [phone]   Fax:  [phone]  Wound Care Therapy  Patient Details  Name: Reginald [name] MRN: [id number] Date of Birth: [birth year] Referring Provider: Neale Burly, MD   Encounter Date: 04/15/2017  PT End of Session - 04/15/17 1359    Visit Number  2    Number of Visits  25    Date for PT Re-Evaluation  06/08/17 minireassess 05/11/17    Authorization Type  [employer]    Authorization Time Period  04/13/17 - 06/08/17    Authorization - Visit Number  2    Authorization - Number of Visits  10    PT Start Time  0817    PT Stop Time  0908    PT Time Calculation (min)  51 min    Activity Tolerance  Patient tolerated treatment well    Behavior During Therapy  [facility] for tasks assessed/performed       Past Medical History:  Diagnosis Date  . Anxiety neurosis   . Arrhythmia    afib  . Atrial fib/flutter, transient   . BPH (benign prostatic hyperplasia)   . Coronary artery disease    MINOR  . Hyperlipidemia   . OBS (organic brain syndrome)   . Orthostatic hypotension   . Syncope and collapse   . Vertigo     Past Surgical History:  Procedure Laterality Date  . APPENDECTOMY    . CARDIAC CATHETERIZATION  06/17/97   FALSE/POSITIVE TREADMILL EXERCISE TEST. MILD CAD WITH NL LV FUNCTION. MILD HYPERTENSION. FAMILY HX OF CAD, REMOTE SMOKER. PAST HX OF PAF.  Marland Kitchen CAROTID DUPLEX Bilateral 03/13/01   SM AMT OF NON HEMODYNAMICALLY SIGN PLAQUE IN THE RIGHT CAROTID BULB, <39% STENOSIS.  Marland Kitchen COLONOSCOPY  10/20/2011   Procedure: COLONOSCOPY;  Surgeon: Rogene Houston, MD;  Location: [facility];  Service: Endoscopy;  Laterality: N/A;  . CRANIOTOMY N/A 02/15/2013   Procedure: CRANIOTOMY HEMATOMA EVACUATION SUBDURAL;  Surgeon: Hosie Spangle, MD;  Location: [place] NEURO ORS;  Service: Neurosurgery;  Laterality: N/A;  . DOPPLER ECHOCARDIOGRAPHY N/A 02/01/12   LV CAVITY SIZE IS  NORMAL. MILD CONCENTRIC HYPERTROPHY. EF 55-60%. PARAMETERS ARE CONSISTANT WITH ABNORMAL LV RELAXATION.(GRADE 1 DIASTOLIC DYSFUNCTION). MR. LEFT ATRIUM MODERATELY DILATED. RIGHT ATRIUM MILDLY DILATED. ATRIAL SEPTUM NORMAL. ATRIAL PACED RHYTHM WITH INTACT A-V CONDUCTION.  Marland Kitchen enrhythm generator     implant of a medtronic generator  . interrogation     atrial and ventricular  . NUCLEAR STRESS TEST N/A 03/18/09   NORMAL PATTERN OF PERFUSION IN ALL REGIONS. LEFT VENTRICULAR SIZE IS NORMAL. NO EVID OF INDUCIBLE ISCHEMIA. EF 77%. NORMAL MYOCARDIAL PERFUSION STUDY.  Marland Kitchen PACEMAKER GENERATOR CHANGE N/A 07/24/2013   Procedure: PACEMAKER GENERATOR CHANGE;  Surgeon: Sanda Klein, MD;  Location: [place] CATH LAB;  Service: Cardiovascular;  Laterality: N/A;  . PULSE GENERATOR IMPLANT      There were no vitals filed for this visit.   Subjective Assessment - 04/15/17 1245    Subjective  Pt nonverbal through session, did make cold sounds "brr" but no reports of pain through session.    Currently in Pain?  No/denies                Wound Therapy - 04/15/17 1247    Subjective  Pt nonverbal through session, did make cold sounds "brr" but no reports of pain through session.    Patient and Family Stated Goals  Wound  to heal and patient to not be in pain    Prior Treatments  home RN for 2 weeks, ended last week and recommended wound care referral    Pain Scale  Faces    Faces Pain Scale  No hurt    Pain Type  Acute pain    Pain Location  Sacrum    Pain Orientation  Posterior    Pain Descriptors / Indicators  -- pt did not verbalize and pain.  Did make "brr" cold sounds    Patients Stated Pain Goal  0    Pain Intervention(s)  Emotional support    Evaluation and Treatment Procedures Explained to Patient/Family  Yes    Evaluation and Treatment Procedures  Patient unable to consent due to mental status    Pressure Injury Properties Date First Assessed: 04/13/17 Time First Assessed: 1443 Location: Sacrum  Location Orientation: Posterior;Mid Staging: Stage IV - Full thickness tissue loss with exposed bone, tendon or muscle. Wound Description (Comments): large open wound with yellow/greenish slough present in openign and on wound bed and adheared to wound margins Present on Admission: Yes   Dressing Type  Abdominal pads;Silver hydrofiber;Gauze (Comment);Adhesive strips saline gauze and vasoline perimeter    Dressing  Changed;Old drainage (marked)    Dressing Change Frequency  PRN    State of Healing  Non-healing    Site / Wound Assessment  Granulation tissue;Yellow;Red    % Wound base Red or Granulating  55%    % Wound base Yellow/Fibrinous Exudate  45%    % Wound base Black/Eschar  0%    Undermining (cm)  -- undermining    Margins  Epibole (rolled edges)    Drainage Amount  Copious    Drainage Description  Purulent;Green;Odor    Treatment  Cleansed;Debridement (Selective);Hydrotherapy (Pulse lavage)    Pressure Injury Properties Date First Assessed: 04/13/17 Time First Assessed: 1124 Location: Sacrum Location Orientation: Left;Mid Staging: Deep Tissue Injury - Purple or maroon localized area of discolored intact skin or blood-filled blister due to damage of underlying soft tissue from pressure and/or shear. Wound Description (Comments): connected to and located on left/slightly supirior board of central sacral wound, located between 8 and 12 o'clock Present on Admission: Yes   Dressing Type  Abdominal pads;Silver hydrofiber;Gauze (Comment);Adhesive strips    Dressing  -- petrolleum jelly    Dressing Change Frequency  PRN    State of Healing  Non-healing    Site / Wound Assessment  Dry;Purple;Friable    % Wound base Red or Granulating  0%    % Wound base Yellow/Fibrinous Exudate  40%    % Wound base Other/Granulation Tissue (Comment)  60%    Peri-wound Assessment  -- purple/maroon coloration of closed pressure sore    Margins  Attached edges (approximated)    Drainage Amount  None dry friable  yellow    Treatment  Cleansed;Debridement (Selective);Tape changed silver hydrofiber, petrolleum jelly and medipore tape    Pressure Injury Properties Date First Assessed: 04/13/17 Time First Assessed: 1124 Location: Sacrum Location Orientation: Right;Distal;Posterior Staging: Unstageable - Full thickness tissue loss in which the base of the ulcer is covered by slough (yellow, tan, gray, green or brown) and/or eschar (tan, brown or black) in the wound bed. Wound Description (Comments): located right and distally to central sacral wound Present on Admission: Yes   Dressing Type  Abdominal pads;Silver hydrofiber;Gauze (Comment);Adhesive strips    Dressing  Changed;Old drainage (marked)    Dressing Change Frequency  PRN  State of Healing  Eschar    Site / Wound Assessment  Yellow;Red    % Wound base Red or Granulating  20%    % Wound base Yellow/Fibrinous Exudate  80%    Drainage Amount  Scant    Drainage Description  Purulent;Odor    Pulsed lavage therapy - wound location  Central sacral wound    Pulsed Lavage with Suction (psi)  300 psi    Pulsed Lavage Tip  Tip with splash shield    Selective Debridement - Location  PLS complete on sacral wound for cleansing and selective debridement for removal of necrotic tissue to promote healing.  Culture complete and delivered to lab.  Pt caregiver educated on pressure relief techniques and importance of turning for pressure relief.  Care giver given referral page for ROJO high prolie single compartment cushion and discussed firm wedges to assist in rolling back.  No reoprts of pain through session, pt did make "brr" sounds indicating he was cold.  Blanket given for comfort.      Selective Debridement - Tools Used  Forceps;Scissors    Selective Debridement - Tissue Removed  purulent slough and necrotic tissue in wound bed and aroudn margins of wound    Wound Therapy - Clinical Statement  Reginald [name] present with severe full thickness and deep pressure injuries  around sacrum. There are signs of localized infection with redness surrounds wounds beyond 3 cm from wound margins and with purulent odorous slough that is adherent to the wound bed and margins. He will require skilled wound care services to cleanse and debride the wound as well as determine proper dressing to promote optimal healing. Debridement should include with pulsed lavage and selective mechanical debridement, margins should be closely monitored due to significant undermining. He is fully dependent on his caregivers for pressure relief and family/caregiver training should be performed to educate on proper pressure relief turns. He will also benefit from an appropriate wheelchair air cushion to provide pressure relief when sitting up in chair. I have spoken with Dr. Sherrie Sport office and recommended long term antibiotics to treat the infection and they have provided an order to perform a wound culture to order the most appropriate antibiotic.    Wound Therapy - Functional Problem List  decreased mobility, dependent for pressure relief, poor nutritional intake    Factors Delaying/Impairing Wound Healing  Immobility    Hydrotherapy Plan  Debridement;Dressing change;Patient/family education;Pulsatile lavage with suction    Wound Therapy - Frequency  3X / week    Wound Therapy - Current Recommendations  PT    Wound Plan  F/U wiht culture findings/antibiotics and ROJO purchase for chair.  Give picture paperwork with wedge and pressure relief for feet.  Inform family that after culture is tested Dr. Sherrie Sport will place an order for the appropriate antibiotic to treat the infection. Measure every Wednesday. Pulsed lavage and pack wound with saline gauze. Discuss bed mobility and pressure relief schedule for turns (instruct on turns every 1-2 hours max) and educated on wedges to support patient at 45 degrees angle in side lying rather than pillows which flatten over time and do not provide proper support for  pressure relief.  proper nutritional supplements and importance of good protein intake to promote optimal wound healing.                PT Short Term Goals - 04/13/17 1458      PT SHORT TERM GOAL #1   Title  Patient's family/caregiver will  verbalize understanding of pressure sensitive areas and reports appropriate safe methods to prevent pressure sores from developing.     Time  2    Period  Weeks    Status  New    Target Date  05/25/17      PT SHORT TERM GOAL #2   Title  Patient's family/caregiver will verbalize understanding of pressure relief recommendations and report pressure relief schedule to improve skin integrity and decrease breakdown.    Time  2    Period  Weeks    Status  New      PT SHORT TERM GOAL #3   Title  Family/caregiver will demonstrate proper inflation and fit technique for wheelchair cushion to provide optimal pressure relief for patient's sacrum while seated in wheelchair.    Time  4    Period  Weeks    Status  New    Target Date  05/11/17        PT Long Term Goals - 04/13/17 1502      PT LONG TERM GOAL #1   Title  Wound will decreas volume by 50% and there will be no signs of infection surround wound/periwound to show improved tissue health.    Time  8    Period  Weeks    Status  New    Target Date  06/08/17              Patient will benefit from skilled therapeutic intervention in order to improve the following deficits and impairments:     Visit Diagnosis: Pressure injury of sacral region, stage 4 (HCC)  Pressure injury of sacral region, unstageable ([place])  Other abnormalities of gait and mobility     Problem List Patient Active Problem List   Diagnosis Date Noted  . Symptomatic sinus bradycardia 07/24/2013  . Pacemaker battery depletion 07/17/2013  . Preoperative clearance 02/15/2013  . Subdural hematoma ([place]) 02/13/2013  . Falls frequently 11/15/2012  . Long term (current) use of anticoagulants 04/21/2012  . Hx of  adenomatous colonic polyps 02/19/2012  . Constipation 10/07/2011  . UNSPECIFIED HYPOTHYROIDISM 06/18/2009  . Essential hypertension 06/18/2009  . ATRIAL FIBRILLATION 06/18/2009  . ORTHOSTATIC HYPOTENSION 06/18/2009  . SYNCOPE AND COLLAPSE 06/18/2009  . Cardiac pacemaker in situ 06/18/2009   Reginald [name], [place]; Vivian  Reginald [name] 04/15/2017, 2:03 PM  [place] [address] [place], [place], [address] Phone: [phone]   Fax:  [phone]  Name: Reginald [name] MRN: [id number] Date of Birth: 15-Jan-1924

## 2017-04-18 ENCOUNTER — Encounter (HOSPITAL_COMMUNITY): Payer: Self-pay

## 2017-04-18 ENCOUNTER — Telehealth (HOSPITAL_COMMUNITY): Payer: Self-pay

## 2017-04-18 ENCOUNTER — Ambulatory Visit (HOSPITAL_COMMUNITY): Payer: Medicare Other

## 2017-04-18 ENCOUNTER — Other Ambulatory Visit: Payer: Self-pay

## 2017-04-18 DIAGNOSIS — L89154 Pressure ulcer of sacral region, stage 4: Secondary | ICD-10-CM | POA: Diagnosis not present

## 2017-04-18 DIAGNOSIS — L89150 Pressure ulcer of sacral region, unstageable: Secondary | ICD-10-CM

## 2017-04-18 DIAGNOSIS — R2689 Other abnormalities of gait and mobility: Secondary | ICD-10-CM | POA: Diagnosis not present

## 2017-04-18 NOTE — Therapy (Signed)
[place], [place], [address] Phone: [phone]   Fax:  [phone]  Wound Care Therapy  Patient Details  Name: Reginald [name] MRN: [id number] Date of Birth: Dec 31, 1924 Referring Provider: Neale Burly, MD   Encounter Date: 04/18/2017  PT End of Session - 04/18/17 1812    Visit Number  3    Number of Visits  25    Date for PT Re-Evaluation  06/08/17 minireassess 05/11/17    Authorization Type  [employer]    Authorization Time Period  04/13/17 - 06/08/17    Authorization - Visit Number  3    Authorization - Number of Visits  10    PT Start Time  1302    PT Stop Time  1403    PT Time Calculation (min)  61 min    Activity Tolerance  Patient tolerated treatment well    Behavior During Therapy  [facility] for tasks assessed/performed       Past Medical History:  Diagnosis Date  . Anxiety neurosis   . Arrhythmia    afib  . Atrial fib/flutter, transient   . BPH (benign prostatic hyperplasia)   . Coronary artery disease    MINOR  . Hyperlipidemia   . OBS (organic brain syndrome)   . Orthostatic hypotension   . Syncope and collapse   . Vertigo     Past Surgical History:  Procedure Laterality Date  . APPENDECTOMY    . CARDIAC CATHETERIZATION  06/17/97   FALSE/POSITIVE TREADMILL EXERCISE TEST. MILD CAD WITH NL LV FUNCTION. MILD HYPERTENSION. FAMILY HX OF CAD, REMOTE SMOKER. PAST HX OF PAF.  Marland Kitchen CAROTID DUPLEX Bilateral 03/13/01   SM AMT OF NON HEMODYNAMICALLY SIGN PLAQUE IN THE RIGHT CAROTID BULB, <39% STENOSIS.  Marland Kitchen COLONOSCOPY  10/20/2011   Procedure: COLONOSCOPY;  Surgeon: Rogene Houston, MD;  Location: [facility];  Service: Endoscopy;  Laterality: N/A;  . CRANIOTOMY N/A 02/15/2013   Procedure: CRANIOTOMY HEMATOMA EVACUATION SUBDURAL;  Surgeon: Hosie Spangle, MD;  Location: [facility]URO ORS;  Service: Neurosurgery;  Laterality: N/A;  . DOPPLER ECHOCARDIOGRAPHY N/A 02/01/12   LV CAVITY SIZE IS  NORMAL. MILD CONCENTRIC HYPERTROPHY. EF 55-60%. PARAMETERS ARE CONSISTANT WITH ABNORMAL LV RELAXATION.(GRADE 1 DIASTOLIC DYSFUNCTION). MR. LEFT ATRIUM MODERATELY DILATED. RIGHT ATRIUM MILDLY DILATED. ATRIAL SEPTUM NORMAL. ATRIAL PACED RHYTHM WITH INTACT A-V CONDUCTION.  Marland Kitchen enrhythm generator     implant of a medtronic generator  . interrogation     atrial and ventricular  . NUCLEAR STRESS TEST N/A 03/18/09   NORMAL PATTERN OF PERFUSION IN ALL REGIONS. LEFT VENTRICULAR SIZE IS NORMAL. NO EVID OF INDUCIBLE ISCHEMIA. EF 77%. NORMAL MYOCARDIAL PERFUSION STUDY.  Marland Kitchen PACEMAKER GENERATOR CHANGE N/A 07/24/2013   Procedure: PACEMAKER GENERATOR CHANGE;  Surgeon: Sanda Klein, MD;  Location: [facility] CATH LAB;  Service: Cardiovascular;  Laterality: N/A;  . PULSE GENERATOR IMPLANT      There were no vitals filed for this visit.   Wound Therapy - 04/18/17 1743    Subjective  Patient primarily nonverbal throughout session, however, did ask "how much longer" about halfway through session. Caregiver "Gerald Stabs" reports there is another caregiver who is with the patient on Friday's however she is not physically strong enough to transfer the patient from his bed to the wheelchair. He reports after 5PM the patient's daughter takes over and he suggests she is not performing as much mobility with him as needed.     Patient and  Family Stated Goals  Wound to heal and patient to not be in pain    Pain Scale  Faces    Faces Pain Scale  No hurt    Pain Type  Acute pain    Pain Location  Sacrum    Pain Orientation  Posterior    Patients Stated Pain Goal  0    Pain Intervention(s)  Emotional support    Evaluation and Treatment Procedures Explained to Patient/Family  Yes    Evaluation and Treatment Procedures  Patient unable to consent due to mental status caregiver consent to treatment    Pressure Injury Properties Date First Assessed: 04/13/17 Time First Assessed: 4854 Location: Sacrum Location Orientation: Posterior;Mid  Staging: Stage IV - Full thickness tissue loss with exposed bone, tendon or muscle. Wound Description (Comments): large open wound with yellow/greenish slough present in openign and on wound bed and adheared to wound margins Present on Admission: Yes   Dressing Type  Abdominal pads;Silver hydrofiber;Gauze (Comment);Adhesive strips gauze (1 strip) to pack wound; petrolleum jelly    Dressing  Changed;Old drainage (marked)    Dressing Change Frequency  PRN    State of Healing  Non-healing    Site / Wound Assessment  Granulation tissue;Yellow;Red    % Wound base Red or Granulating  60%    % Wound base Yellow/Fibrinous Exudate  40%    % Wound base Black/Eschar  0%    Margins  Epibole (rolled edges)    Drainage Amount  Copious    Drainage Description  Purulent;Green;Odor    Treatment  Cleansed;Debridement (Selective);Hydrotherapy (Pulse lavage);Packing (Saline gauze)    Pressure Injury Properties Date First Assessed: 04/13/17 Time First Assessed: 1124 Location: Sacrum Location Orientation: Left;Mid Staging: Deep Tissue Injury - Purple or maroon localized area of discolored intact skin or blood-filled blister due to damage of underlying soft tissue from pressure and/or shear. Wound Description (Comments): connected to and located on left/slightly supirior board of central sacral wound, located between 8 and 12 o'clock Present on Admission: Yes   Dressing Type  Abdominal pads;Silver hydrofiber;Gauze (Comment);Adhesive strips petrolleum jelly    Dressing  Changed;Old drainage (marked)    Dressing Change Frequency  PRN    State of Healing  Non-healing    Site / Wound Assessment  Dry;Purple;Pink    % Wound base Red or Granulating  0%    % Wound base Yellow/Fibrinous Exudate  30%    % Wound base Other/Granulation Tissue (Comment)  70% surface level visible    Peri-wound Assessment  -- pink/maroon coloration of closed pressure sore    Margins  Attached edges (approximated)    Drainage Amount  None     Treatment  Cleansed;Debridement (Selective);Tape changed silver hydrofiber, petrolleum jelly    Pressure Injury Properties Date First Assessed: 04/13/17 Time First Assessed: 1124 Location: Sacrum Location Orientation: Right;Distal;Posterior Staging: Unstageable - Full thickness tissue loss in which the base of the ulcer is covered by slough (yellow, tan, gray, green or brown) and/or eschar (tan, brown or black) in the wound bed. Wound Description (Comments): located right and distally to central sacral wound Present on Admission: Yes   Dressing Type  Abdominal pads;Silver hydrofiber;Gauze (Comment);Adhesive strips petrolleum jelly    Dressing  Changed;Old drainage (marked)    Dressing Change Frequency  PRN    State of Healing  Epithelialized    Site / Wound Assessment  Pink;Red    % Wound base Red or Granulating  50%    % Wound base Other/Granulation Tissue (  Comment)  50% pink early epithelialization    Drainage Amount  None    Drainage Description  No odor    Treatment  Cleansed;Tape changed petrolleum jelly    Pulsed lavage therapy - wound location  Central sacral wound    Pulsed Lavage with Suction (psi)  300 psi    Pulsed Lavage with Suction - Normal Saline Used  2000 mL    Pulsed Lavage Tip  Tip with splash shield    Selective Debridement - Location  PLS complete on sacral wound for cleansing and selective debridement for removal of necrotic tissue to promote healing.  Culture complete and delivered to lab.  Pt caregiver educated on pressure relief techniques and importance of turning for pressure relief.  Care giver given referral page for ROJO high prolie single compartment cushion and discussed firm wedges to assist in rolling back.  No reoprts of pain through session, pt did make "brr" sounds indicating he was cold.  Blanket given for comfort.      Selective Debridement - Tools Used  Forceps;Scalpel    Selective Debridement - Tissue Removed  purulent slough and necrotic tissue in wound bed  and aroudn margins of wound    Wound Therapy - Clinical Statement  Began session with PLS and selective debridement of necrotic tissue and slough from central sacral wound.  Caregiver educated on importance of pressure relief and to obtain antibiotic after MD orders it based on lab testing from Friday's culture. Overall inferior wound appears to be healing well with increased epithelialization and no slough present this date, and deep pressure injury at superiolateral boarder of central wound has improve epithelialization as well. Central sacral wound has improved granulation tissue this session but slough remains adherent and wound has ongoing odor. Continued with saline gauze, silver hydrofiber and ABD pad with medipore tape. I have ordered sacral wound bandages for future sessions.   Wound Therapy - Functional Problem List  decreased mobility, dependent for pressure relief, poor nutritional intake    Factors Delaying/Impairing Wound Healing  Immobility    Hydrotherapy Plan  Debridement;Dressing change;Patient/family education;Pulsatile lavage with suction    Wound Therapy - Frequency  3X / week    Wound Therapy - Current Recommendations  PT    Wound Plan   F/U with culture findings/antibiotics and ROJO purchase for chair.  Give picture paperwork with wedge and pressure relief for feet. Measure every Wednesday. Pulsed lavage and pack wound with saline gauze. Discuss bed mobility and pressure relief schedule for turns (instruct on turns every 1-2 hours max) and educated on wedges to support patient at 45 degrees angle in side lying rather than pillows which flatten over time and do not provide proper support for pressure relief. Educate on proper nutritional supplements and importance of good protein intake to promote optimal wound healing.         PT Short Term Goals - 04/13/17 1458      PT SHORT TERM GOAL #1   Title  Patient's family/caregiver will verbalize understanding of pressure sensitive  areas and reports appropriate safe methods to prevent pressure sores from developing.     Time  2    Period  Weeks    Status  New    Target Date  05/25/17      PT SHORT TERM GOAL #2   Title  Patient's family/caregiver will verbalize understanding of pressure relief recommendations and report pressure relief schedule to improve skin integrity and decrease breakdown.    Time  2    Period  Weeks    Status  New      PT SHORT TERM GOAL #3   Title  Family/caregiver will demonstrate proper inflation and fit technique for wheelchair cushion to provide optimal pressure relief for patient's sacrum while seated in wheelchair.    Time  4    Period  Weeks    Status  New    Target Date  05/11/17        PT Long Term Goals - 04/13/17 1502      PT LONG TERM GOAL #1   Title  Wound will decreas volume by 50% and there will be no signs of infection surround wound/periwound to show improved tissue health.    Time  8    Period  Weeks    Status  New    Target Date  06/08/17         Plan - 04/18/17 1811    Clinical Impression Statement  see above    Rehab Potential  Poor    Clinical Impairments Affecting Rehab Potential  decreased mobility.    PT Frequency  3x / week    PT Duration  8 weeks    PT Treatment/Interventions  ADLs/Self Care Home Management;Patient/family education;Manual techniques;Therapeutic activities;Passive range of motion;Other (comment);Taping selective debridement, pulsed lavage, cleansing and dressing wound    PT Next Visit Plan  F/U with culture findings/antibiotics and ROJO purchase for chair.  Give picture paperwork with wedge and pressure relief for feet. Measure every Wednesday. Pulsed lavage and pack wound with saline gauze. Discuss bed mobility and pressure relief schedule for turns (instruct on turns every 1-2 hours max) and educated on wedges to support patient at 45 degrees angle in side lying rather than pillows which flatten over time and do not provide proper  support for pressure relief. Educate on proper nutritional supplements and importance of good protein intake to promote optimal wound healing.    Consulted and Agree with Plan of Care  Patient;Family member/caregiver    Family Member Consulted  caregiver, Gerald Stabs       Patient will benefit from skilled therapeutic intervention in order to improve the following deficits and impairments:  Decreased skin integrity, Improper body mechanics, Pain, Decreased mobility  Visit Diagnosis: Pressure injury of sacral region, stage 4 ([facility])  Pressure injury of sacral region, unstageable ([place])  Other abnormalities of gait and mobility     Problem List Patient Active Problem List   Diagnosis Date Noted  . Symptomatic sinus bradycardia 07/24/2013  . Pacemaker battery depletion 07/17/2013  . Preoperative clearance 02/15/2013  . Subdural hematoma ([place]) 02/13/2013  . Falls frequently 11/15/2012  . Long term (current) use of anticoagulants 04/21/2012  . Hx of adenomatous colonic polyps 02/19/2012  . Constipation 10/07/2011  . UNSPECIFIED HYPOTHYROIDISM 06/18/2009  . Essential hypertension 06/18/2009  . ATRIAL FIBRILLATION 06/18/2009  . ORTHOSTATIC HYPOTENSION 06/18/2009  . SYNCOPE AND COLLAPSE 06/18/2009  . Cardiac pacemaker in situ 06/18/2009    [name], PT, DPT Physical Therapist with [facility]  04/18/2017 6:16 PM     [facility] [address] [place], [place], [address] Phone: [phone]   Fax:  [phone]  Name: Reginald [name] MRN: [id number] Date of Birth: June 21, 1924

## 2017-04-18 NOTE — Telephone Encounter (Signed)
Pt's caregiver/Daughter was informed of ins benefits. NF 04/18/17

## 2017-04-19 LAB — AEROBIC CULTURE  (SUPERFICIAL SPECIMEN)

## 2017-04-19 LAB — AEROBIC CULTURE W GRAM STAIN (SUPERFICIAL SPECIMEN)

## 2017-04-20 ENCOUNTER — Encounter (HOSPITAL_COMMUNITY): Payer: Self-pay

## 2017-04-20 ENCOUNTER — Ambulatory Visit (HOSPITAL_COMMUNITY): Payer: Medicare Other

## 2017-04-20 ENCOUNTER — Other Ambulatory Visit: Payer: Self-pay

## 2017-04-20 DIAGNOSIS — R2689 Other abnormalities of gait and mobility: Secondary | ICD-10-CM | POA: Diagnosis not present

## 2017-04-20 DIAGNOSIS — L89154 Pressure ulcer of sacral region, stage 4: Secondary | ICD-10-CM

## 2017-04-20 DIAGNOSIS — L89150 Pressure ulcer of sacral region, unstageable: Secondary | ICD-10-CM

## 2017-04-20 NOTE — Therapy (Signed)
[place], [place], [address] Phone: [phone]   Fax:  [phone]  Wound Care Therapy  Patient Details  Name: Reginald [name] MRN: [id number] Date of Birth: 11-02-1924 Referring Provider: Neale Burly, MD   Encounter Date: 04/20/2017  PT End of Session - 04/20/17 1801    Visit Number  4    Number of Visits  25    Date for PT Re-Evaluation  06/08/17 minireassess 05/11/17    Authorization Type  [employer]    Authorization Time Period  04/13/17 - 06/08/17    Authorization - Visit Number  4    Authorization - Number of Visits  10    PT Start Time  1350    PT Stop Time  1447    PT Time Calculation (min)  57 min    Activity Tolerance  Patient tolerated treatment well    Behavior During Therapy  [facility] for tasks assessed/performed       Past Medical History:  Diagnosis Date  . Anxiety neurosis   . Arrhythmia    afib  . Atrial fib/flutter, transient   . BPH (benign prostatic hyperplasia)   . Coronary artery disease    MINOR  . Hyperlipidemia   . OBS (organic brain syndrome)   . Orthostatic hypotension   . Syncope and collapse   . Vertigo     Past Surgical History:  Procedure Laterality Date  . APPENDECTOMY    . CARDIAC CATHETERIZATION  06/17/97   FALSE/POSITIVE TREADMILL EXERCISE TEST. MILD CAD WITH NL LV FUNCTION. MILD HYPERTENSION. FAMILY HX OF CAD, REMOTE SMOKER. PAST HX OF PAF.  Marland Kitchen CAROTID DUPLEX Bilateral 03/13/01   SM AMT OF NON HEMODYNAMICALLY SIGN PLAQUE IN THE RIGHT CAROTID BULB, <39% STENOSIS.  Marland Kitchen COLONOSCOPY  10/20/2011   Procedure: COLONOSCOPY;  Surgeon: Rogene Houston, MD;  [facility];  Service: Endoscopy;  Laterality: N/A;  . CRANIOTOMY N/A 02/15/2013   Procedure: CRANIOTOMY HEMATOMA EVACUATION SUBDURAL;  Surgeon: Hosie Spangle, MD;  Location: [place] [facility]URO ORS;  Service: Neurosurgery;  Laterality: N/A;  . DOPPLER ECHOCARDIOGRAPHY N/A 02/01/12   LV CAVITY SIZE IS  NORMAL. MILD CONCENTRIC HYPERTROPHY. EF 55-60%. PARAMETERS ARE CONSISTANT WITH ABNORMAL LV RELAXATION.(GRADE 1 DIASTOLIC DYSFUNCTION). MR. LEFT ATRIUM MODERATELY DILATED. RIGHT ATRIUM MILDLY DILATED. ATRIAL SEPTUM NORMAL. ATRIAL PACED RHYTHM WITH INTACT A-V CONDUCTION.  Marland Kitchen enrhythm generator     implant of a medtronic generator  . interrogation     atrial and ventricular  . NUCLEAR STRESS TEST N/A 03/18/09   NORMAL PATTERN OF PERFUSION IN ALL REGIONS. LEFT VENTRICULAR SIZE IS NORMAL. NO EVID OF INDUCIBLE ISCHEMIA. EF 77%. NORMAL MYOCARDIAL PERFUSION STUDY.  Marland Kitchen PACEMAKER GENERATOR CHANGE N/A 07/24/2013   Procedure: PACEMAKER GENERATOR CHANGE;  Surgeon: Sanda Klein, MD;  Location: [place] CATH LAB;  Service: Cardiovascular;  Laterality: N/A;  . PULSE GENERATOR IMPLANT      There were no vitals filed for this visit.     Wound Therapy - 04/20/17 1805    Subjective  Patient primarily nonverbal throughout session, however smiles upon arrival and at end of session patient smiles and presses therapist hand against his face in a kind manner. Caregiver "Gerald Stabs" reports the second caregiver is with the patient on Friday's and weekends. He continues to suggest she is not performing as much mobility with him as needed.    Patient and Family Stated Goals  Wound to heal and patient to not be  in pain    Pain Scale  Faces    Faces Pain Scale  No hurt    Pain Type  Acute pain    Pain Location  Sacrum    Patients Stated Pain Goal  0    Pain Intervention(s)  Emotional support;Distraction;Heat applied warm towel at start of session    Evaluation and Treatment Procedures Explained to Patient/Family  Yes    Evaluation and Treatment Procedures  Patient unable to consent due to mental status caregiver consent to treatment    Pressure Injury Properties Date First Assessed: 04/13/17 Time First Assessed: 1610 Location: Sacrum Location Orientation: Posterior;Mid Staging: Stage IV - Full thickness tissue loss with exposed  bone, tendon or muscle. Wound Description (Comments): large open wound with yellow/greenish slough present in openign and on wound bed and adheared to wound margins Present on Admission: Yes   Dressing Type  Silver hydrofiber;Gauze (Comment);Adhesive strips sacral foam patch; saline gauze (1 strip); petrolleum jelly    Dressing  Changed;Old drainage (marked)    Dressing Change Frequency  PRN    State of Healing  Non-healing    Site / Wound Assessment  Granulation tissue;Yellow;Red    % Wound base Red or Granulating  60%    % Wound base Yellow/Fibrinous Exudate  40%    % Wound base Black/Eschar  0%    Peri-wound Assessment  Erythema (non-blanchable)    Wound Length (cm)  3 cm was 3    Wound Width (cm)  2.8 cm was 2.8    Wound Depth (cm)  1 cm was 1    Wound Surface Area (cm^2)  8.4 cm^2    Wound Volume (cm^3)  8.4 cm^3    Undermining (cm)  Undermining is 0.7 cm in depth extending from 12:00 to 2:00; 0.5 cm in depth extending from 2:00 to 4:00; 0.6 cm in depth extending from 4:00 to 6:00; 0.8 cm in depth extending from 6:00 to 7:00; and 1.0 from 7:00 to 9:00; and 1.7 cm in depth extending from 9:00 to 10:00; and 1.9 cm from 10:00 to 11:00; and 0.9 cm from 11:00 to 12:00.    Margins  Epibole (rolled edges)    Drainage Amount  Copious    Drainage Description  Purulent;Green;Odor    Treatment  Cleansed;Debridement (Selective);Hydrotherapy (Pulse lavage);Packing (Saline gauze)    Pressure Injury Properties Date First Assessed: 04/13/17 Time First Assessed: 1124 Location: Sacrum Location Orientation: Left;Mid Staging: Deep Tissue Injury - Purple or maroon localized area of discolored intact skin or blood-filled blister due to damage of underlying soft tissue from pressure and/or shear. Wound Description (Comments): connected to and located on left/slightly supirior board of central sacral wound, located between 8 and 12 o'clock Present on Admission: Yes   Dressing Type  Silver hydrofiber;Gauze  (Comment);Adhesive strips;Foam Sacral wound adhesive patch, petrolleum jelly    Dressing  Changed;Old drainage (marked)    Dressing Change Frequency  PRN    State of Healing  Non-healing    Site / Wound Assessment  Dry;Purple;Pink maroon    % Wound base Red or Granulating  0%    % Wound base Yellow/Fibrinous Exudate  15%    % Wound base Other/Granulation Tissue (Comment)  85% surface level visible    Peri-wound Assessment  -- maroon coloration of closed pressure sore    Wound Length (cm)  2.1 cm was 2.6    Wound Width (cm)  1.4 cm was 1.9    Wound Depth (cm)  0 cm was 0  Wound Surface Area (cm^2)  2.94 cm^2    Wound Volume (cm^3)  0 cm^3    Margins  Attached edges (approximated)    Drainage Amount  None    Treatment  Cleansed;Debridement (Selective) Sacral foam patch, silver hydrofiber, petrolleum jelly    Pressure Injury Properties Date First Assessed: 04/13/17 Time First Assessed: 1124 Location: Sacrum Location Orientation: Right;Distal;Posterior Staging: Unstageable - Full thickness tissue loss in which the base of the ulcer is covered by slough (yellow, tan, gray, green or brown) and/or eschar (tan, brown or black) in the wound bed. Wound Description (Comments): located right and distally to central sacral wound Present on Admission: Yes   Dressing Type  Abdominal pads;Silver hydrofiber;Gauze (Comment);Adhesive strips Sacral foam patch, petrolleum jelly    Dressing  Changed;Old drainage (marked)    Dressing Change Frequency  PRN    State of Healing  Epithelialized    Site / Wound Assessment  Pink;Red    % Wound base Red or Granulating  80%    % Wound base Other/Granulation Tissue (Comment)  20% pink early epithelialization    Peri-wound Assessment  Erythema (non-blanchable)    Wound Length (cm)  0.7 cm was 0.9    Wound Width (cm)  0.3 cm was 0.4    Wound Depth (cm)  0 cm was 0    Wound Surface Area (cm^2)  0.21 cm^2    Wound Volume (cm^3)  0 cm^3    Margins  Attached edges  (approximated)    Drainage Amount  None    Drainage Description  No odor    Treatment  Cleansed sacral foam wound patch; petrolleum jelly    Pulsed lavage therapy - wound location  Central sacral wound    Pulsed Lavage with Suction (psi)  300 psi    Pulsed Lavage with Suction - Normal Saline Used  1000 mL    Pulsed Lavage Tip  Tip with splash shield    Selective Debridement - Location  PLS complete on sacral wound for cleansing and selective debridement for removal of necrotic tissue to promote healing. Warm towel used prior to debridement for comfort.    Selective Debridement - Tools Used  Forceps;Scalpel    Selective Debridement - Tissue Removed  purulent slough and necrotic tissue in wound bed and aroudn margins of wound    Wound Therapy - Clinical Statement  Began session with PLS and selective debridement of necrotic tissue and slough from central sacral wound. Wound continues to produce copious drainage and which is purulent and green with an odor. Inferior wound appears to be healing well with increased epithelialization and no slough present this date, and deep pressure injury at superiolateral boarder of central wound has improve epithelialization as well. Central wound has not decreased in size of depth or surface area but second and third wound have both decreased in length and width. Continued with saline gauze, silver hydrofiber and sacral foam adhesive wound patch.     Wound Therapy - Functional Problem List  decreased mobility, dependent for pressure relief, poor nutritional intake    Factors Delaying/Impairing Wound Healing  Immobility    Hydrotherapy Plan  Debridement;Dressing change;Patient/family education;Pulsatile lavage with suction    Wound Therapy - Frequency  3X / week    Wound Therapy - Current Recommendations  PT    Wound Plan  Continue to F/U with culture findings/antibiotics and ROJO purchase for chair. Review wedges for pressure relief and provide information on  prevalon boots for pressure relief. Measure every  Wednesday. Pulsed lavage and pack wound with saline gauze. Discuss bed mobility and pressure relief schedule for turns (instruct on turns every 1-2 hours max) and educated on wedges to support patient at 45 degrees angle in side lying rather than pillows which flatten over time and do not provide proper support for pressure relief. Educate on proper nutritional supplements and importance of good protein intake to promote optimal wound healing. Continue to discuss daughters roel as cargiver and if patient's needs are being met at home sufficiently to ensure a safe environment.        PT Short Term Goals - 04/13/17 1458      PT SHORT TERM GOAL #1   Title  Patient's family/caregiver will verbalize understanding of pressure sensitive areas and reports appropriate safe methods to prevent pressure sores from developing.     Time  2    Period  Weeks    Status  New    Target Date  05/25/17      PT SHORT TERM GOAL #2   Title  Patient's family/caregiver will verbalize understanding of pressure relief recommendations and report pressure relief schedule to improve skin integrity and decrease breakdown.    Time  2    Period  Weeks    Status  New      PT SHORT TERM GOAL #3   Title  Family/caregiver will demonstrate proper inflation and fit technique for wheelchair cushion to provide optimal pressure relief for patient's sacrum while seated in wheelchair.    Time  4    Period  Weeks    Status  New    Target Date  05/11/17        PT Long Term Goals - 04/13/17 1502      PT LONG TERM GOAL #1   Title  Wound will decreas volume by 50% and there will be no signs of infection surround wound/periwound to show improved tissue health.    Time  8    Period  Weeks    Status  New    Target Date  06/08/17        Plan - 04/20/17 1802    Clinical Impression Statement  see above    Rehab Potential  Poor    Clinical Impairments Affecting Rehab Potential   decreased mobility.    PT Frequency  3x / week    PT Duration  8 weeks    PT Treatment/Interventions  ADLs/Self Care Home Management;Patient/family education;Manual techniques;Therapeutic activities;Passive range of motion;Other (comment);Taping selective debridement, pulsed lavage, cleansing and dressing wound    PT Next Visit Plan  F/U with culture findings/antibiotics and ROJO purchase for chair.  Give picture paperwork with wedge and pressure relief for feet. Measure every Wednesday. Pulsed lavage and pack wound with saline gauze. Discuss bed mobility and pressure relief schedule for turns (instruct on turns every 1-2 hours max) and educated on wedges to support patient at 45 degrees angle in side lying rather than pillows which flatten over time and do not provide proper support for pressure relief. Educate on proper nutritional supplements and importance of good protein intake to promote optimal wound healing.    Consulted and Agree with Plan of Care  Patient;Family member/caregiver    Family Member Consulted  caregiver, Gerald Stabs       Patient will benefit from skilled therapeutic intervention in order to improve the following deficits and impairments:  Decreased skin integrity, Improper body mechanics, Pain, Decreased mobility  Visit Diagnosis: Pressure injury of sacral region,  stage 4 ([facility])  Pressure injury of sacral region, unstageable ([place])  Other abnormalities of gait and mobility     Problem List Patient Active Problem List   Diagnosis Date Noted  . Symptomatic sinus bradycardia 07/24/2013  . Pacemaker battery depletion 07/17/2013  . Preoperative clearance 02/15/2013  . Subdural hematoma ([place]) 02/13/2013  . Falls frequently 11/15/2012  . Long term (current) use of anticoagulants 04/21/2012  . Hx of adenomatous colonic polyps 02/19/2012  . Constipation 10/07/2011  . UNSPECIFIED HYPOTHYROIDISM 06/18/2009  . Essential hypertension 06/18/2009  . ATRIAL FIBRILLATION 06/18/2009   . ORTHOSTATIC HYPOTENSION 06/18/2009  . SYNCOPE AND COLLAPSE 06/18/2009  . Cardiac pacemaker in situ 06/18/2009    [name], PT, DPT Physical Therapist with [facility]  04/20/2017 6:32 PM    [place] [address] [place], [place], [address] Phone: [phone]   Fax:  [phone]  Name: Reginald [name] MRN: [id number] Date of Birth: December 11, 1924

## 2017-04-22 ENCOUNTER — Ambulatory Visit (HOSPITAL_COMMUNITY): Payer: Medicare Other

## 2017-04-22 ENCOUNTER — Encounter (HOSPITAL_COMMUNITY): Payer: Self-pay

## 2017-04-22 DIAGNOSIS — L89150 Pressure ulcer of sacral region, unstageable: Secondary | ICD-10-CM | POA: Diagnosis not present

## 2017-04-22 DIAGNOSIS — L89154 Pressure ulcer of sacral region, stage 4: Secondary | ICD-10-CM

## 2017-04-22 DIAGNOSIS — R2689 Other abnormalities of gait and mobility: Secondary | ICD-10-CM

## 2017-04-22 NOTE — Therapy (Signed)
[place], [place], [address] Phone: [phone]   Fax:  [phone]  Wound Care Therapy  Patient Details  Name: Reginald [name] MRN: [id number] Date of Birth: Dec 15, 1924 Referring Provider: Neale Burly, MD   Encounter Date: 04/22/2017  PT End of Session - 04/22/17 0944    Visit Number  5    Number of Visits  25    Date for PT Re-Evaluation  06/08/17 minireassess 05/11/17    Authorization Type  [employer]    Authorization Time Period  04/13/17 - 06/08/17    Authorization - Visit Number  5    Authorization - Number of Visits  10    PT Start Time  0904    PT Stop Time  0945    PT Time Calculation (min)  41 min    Activity Tolerance  Patient tolerated treatment well    Behavior During Therapy  [facility] for tasks assessed/performed;Restless did express some reports of pain with packing today       Past Medical History:  Diagnosis Date  . Anxiety neurosis   . Arrhythmia    afib  . Atrial fib/flutter, transient   . BPH (benign prostatic hyperplasia)   . Coronary artery disease    MINOR  . Hyperlipidemia   . OBS (organic brain syndrome)   . Orthostatic hypotension   . Syncope and collapse   . Vertigo     Past Surgical History:  Procedure Laterality Date  . APPENDECTOMY    . CARDIAC CATHETERIZATION  06/17/97   FALSE/POSITIVE TREADMILL EXERCISE TEST. MILD CAD WITH NL LV FUNCTION. MILD HYPERTENSION. FAMILY HX OF CAD, REMOTE SMOKER. PAST HX OF PAF.  Marland Kitchen CAROTID DUPLEX Bilateral 03/13/01   SM AMT OF NON HEMODYNAMICALLY SIGN PLAQUE IN THE RIGHT CAROTID BULB, <39% STENOSIS.  Marland Kitchen COLONOSCOPY  10/20/2011   Procedure: COLONOSCOPY;  Surgeon: Rogene Houston, MD;  [facility];  Service: Endoscopy;  Laterality: N/A;  . CRANIOTOMY N/A 02/15/2013   Procedure: CRANIOTOMY HEMATOMA EVACUATION SUBDURAL;  Surgeon: Hosie Spangle, MD;  Location: [place] NEURO ORS;  Service: Neurosurgery;  Laterality: N/A;  .  DOPPLER ECHOCARDIOGRAPHY N/A 02/01/12   LV CAVITY SIZE IS NORMAL. MILD CONCENTRIC HYPERTROPHY. EF 55-60%. PARAMETERS ARE CONSISTANT WITH ABNORMAL LV RELAXATION.(GRADE 1 DIASTOLIC DYSFUNCTION). MR. LEFT ATRIUM MODERATELY DILATED. RIGHT ATRIUM MILDLY DILATED. ATRIAL SEPTUM NORMAL. ATRIAL PACED RHYTHM WITH INTACT A-V CONDUCTION.  Marland Kitchen enrhythm generator     implant of a medtronic generator  . interrogation     atrial and ventricular  . NUCLEAR STRESS TEST N/A 03/18/09   NORMAL PATTERN OF PERFUSION IN ALL REGIONS. LEFT VENTRICULAR SIZE IS NORMAL. NO EVID OF INDUCIBLE ISCHEMIA. EF 77%. NORMAL MYOCARDIAL PERFUSION STUDY.  Marland Kitchen PACEMAKER GENERATOR CHANGE N/A 07/24/2013   Procedure: PACEMAKER GENERATOR CHANGE;  Surgeon: Sanda Klein, MD;  Location: [place] CATH LAB;  Service: Cardiovascular;  Laterality: N/A;  . PULSE GENERATOR IMPLANT      There were no vitals filed for this visit.   Subjective Assessment - 04/22/17 1309    Subjective  Pt nonverbal, arrived with big grin on face.  Caretaker reports [birth year] his birthday today, unsure if he is aware.    Currently in Pain?  No/denies                Wound Therapy - 04/22/17 0955    Subjective  Pt smiley at entrance.  Primarily nonverbal through session, did express some discomfort  during packing    Patient and Family Stated Goals  Wound to heal and patient to not be in pain    Prior Treatments  home RN for 2 weeks, ended last week and recommended wound care referral    Pain Scale  Faces    Faces Pain Scale  No hurt    Pain Type  Acute pain    Pain Location  Sacrum    Patients Stated Pain Goal  0    Pain Intervention(s)  Emotional support;Distraction;Other (Comment) blanket for comfort    Evaluation and Treatment Procedures Explained to Patient/Family  Yes    Evaluation and Treatment Procedures  Patient unable to consent due to mental status caregiver consent to treatment    Pressure Injury Properties Date First Assessed: 04/13/17 Time First Assessed:  4132 Location: Sacrum Location Orientation: Posterior;Mid Staging: Stage IV - Full thickness tissue loss with exposed bone, tendon or muscle. Wound Description (Comments): large open wound with yellow/greenish slough present in openign and on wound bed and adheared to wound margins Present on Admission: Yes   Dressing Type  Silver hydrofiber;Gauze (Comment);Adhesive strips saline gauze packed, silver hydrofiber, sacral wound cover    Dressing  Changed;Old drainage (marked)    Dressing Change Frequency  PRN    State of Healing  Non-healing    Site / Wound Assessment  Granulation tissue;Yellow;Red    % Wound base Red or Granulating  65%    % Wound base Yellow/Fibrinous Exudate  35%    Peri-wound Assessment  Erythema (non-blanchable)    Undermining (cm)  undermining    Margins  Epibole (rolled edges)    Drainage Amount  Copious    Drainage Description  Purulent;Green;Odor    Treatment  Cleansed;Debridement (Selective);Hydrotherapy (Pulse lavage)    Pressure Injury Properties Date First Assessed: 04/13/17 Time First Assessed: 1124 Location: Sacrum Location Orientation: Right;Distal;Posterior Staging: Unstageable - Full thickness tissue loss in which the base of the ulcer is covered by slough (yellow, tan, gray, green or brown) and/or eschar (tan, brown or black) in the wound bed. Wound Description (Comments): located right and distally to central sacral wound Present on Admission: Yes   % Wound base Red or Granulating  --    % Wound base Yellow/Fibrinous Exudate  --    Pressure Injury Properties Date First Assessed: 04/13/17 Time First Assessed: 1124 Location: Sacrum Location Orientation: Left;Mid Staging: Deep Tissue Injury - Purple or maroon localized area of discolored intact skin or blood-filled blister due to damage of underlying soft tissue from pressure and/or shear. Wound Description (Comments): connected to and located on left/slightly supirior board of central sacral wound, located between 8  and 12 o'clock Present on Admission: Yes   Pulsed lavage therapy - wound location  Central sacral wound    Pulsed Lavage with Suction (psi)  300 psi    Pulsed Lavage with Suction - Normal Saline Used  1000 mL    Pulsed Lavage Tip  Tip with splash shield    Selective Debridement - Location  PLS complete on sacral wound for cleansing and selective debridement for removal of necrotic tissue to promote healing. Warm towel used prior to debridement for comfort.    Selective Debridement - Tools Used  Forceps;Scalpel    Selective Debridement - Tissue Removed  purulent slough and necrotic tissue in wound bed and aroudn margins of wound    Wound Therapy - Clinical Statement  Improved skin integrity surrounding wound.  Only the sacral wound upon removal of dressings today.  Continued PLS and selective debridement for removal of dead tissue and slough.  Continued with saline gauze packing and silverhydrofiber dressings, vaseline perimeter and sacral wound cover.  No reports of pain throigh session except expressed some discomfort during packing.      Wound Therapy - Functional Problem List  decreased mobility, dependent for pressure relief, poor nutritional intake    Factors Delaying/Impairing Wound Healing  Immobility    Hydrotherapy Plan  Debridement;Dressing change;Patient/family education;Pulsatile lavage with suction    Wound Therapy - Frequency  3X / week    Wound Therapy - Current Recommendations  PT    Wound Plan  Continue to F/U with culture findings/antibiotics and ROJO purchase for chair. Review wedges for pressure relief and provide information on prevalon boots for pressure relief. Measure every Wednesday. Pulsed lavage and pack wound with saline gauze. Discuss bed mobility and pressure relief schedule for turns (instruct on turns every 1-2 hours max) and educated on wedges to support patient at 45 degrees angle in side lying rather than pillows which flatten over time and do not provide proper  support for pressure relief. Educate on proper nutritional supplements and importance of good protein intake to promote optimal wound healing. Continue to discuss daughters roel as cargiver and if patient's needs are being met at home sufficiently to ensure a safe environment.                PT Short Term Goals - 04/13/17 1458      PT SHORT TERM GOAL #1   Title  Patient's family/caregiver will verbalize understanding of pressure sensitive areas and reports appropriate safe methods to prevent pressure sores from developing.     Time  2    Period  Weeks    Status  New    Target Date  05/25/17      PT SHORT TERM GOAL #2   Title  Patient's family/caregiver will verbalize understanding of pressure relief recommendations and report pressure relief schedule to improve skin integrity and decrease breakdown.    Time  2    Period  Weeks    Status  New      PT SHORT TERM GOAL #3   Title  Family/caregiver will demonstrate proper inflation and fit technique for wheelchair cushion to provide optimal pressure relief for patient's sacrum while seated in wheelchair.    Time  4    Period  Weeks    Status  New    Target Date  05/11/17        PT Long Term Goals - 04/13/17 1502      PT LONG TERM GOAL #1   Title  Wound will decreas volume by 50% and there will be no signs of infection surround wound/periwound to show improved tissue health.    Time  8    Period  Weeks    Status  New    Target Date  06/08/17              Patient will benefit from skilled therapeutic intervention in order to improve the following deficits and impairments:     Visit Diagnosis: Pressure injury of sacral region, stage 4 (HCC)  Pressure injury of sacral region, unstageable ([place])  Other abnormalities of gait and mobility     Problem List Patient Active Problem List   Diagnosis Date Noted  . Symptomatic sinus bradycardia 07/24/2013  . Pacemaker battery depletion 07/17/2013  . Preoperative  clearance 02/15/2013  . Subdural hematoma ([place]) 02/13/2013  .  Falls frequently 11/15/2012  . Long term (current) use of anticoagulants 04/21/2012  . Hx of adenomatous colonic polyps 02/19/2012  . Constipation 10/07/2011  . UNSPECIFIED HYPOTHYROIDISM 06/18/2009  . Essential hypertension 06/18/2009  . ATRIAL FIBRILLATION 06/18/2009  . ORTHOSTATIC HYPOTENSION 06/18/2009  . SYNCOPE AND COLLAPSE 06/18/2009  . Cardiac pacemaker in situ 06/18/2009   Ihor Austin, [place]; [place]  Aldona Lento 04/22/2017, 3:55 PM  [place], [place], [address] Phone: [phone]   Fax:  [phone]  Name: Reginald [name] MRN: [id number] Date of Birth: 1924-06-18

## 2017-04-25 ENCOUNTER — Ambulatory Visit (HOSPITAL_COMMUNITY): Payer: Medicare Other | Admitting: Physical Therapy

## 2017-04-25 DIAGNOSIS — R2689 Other abnormalities of gait and mobility: Secondary | ICD-10-CM | POA: Diagnosis not present

## 2017-04-25 DIAGNOSIS — L89150 Pressure ulcer of sacral region, unstageable: Secondary | ICD-10-CM

## 2017-04-25 DIAGNOSIS — L89154 Pressure ulcer of sacral region, stage 4: Secondary | ICD-10-CM

## 2017-04-25 NOTE — Therapy (Signed)
[place], [place], [address] Phone: [phone]   Fax:  [phone]  Wound Care Therapy  Patient Details  Name: Reginald [name] MRN: [id number] Date of Birth: Nov 15, 1924 Referring Provider: Neale Burly, MD   Encounter Date: 04/25/2017  PT End of Session - 04/25/17 1720    Visit Number  6    Number of Visits  25    Date for PT Re-Evaluation  06/08/17 minireassess 05/11/17    Authorization Type  [employer]    Authorization Time Period  04/13/17 - 06/08/17    Authorization - Visit Number  6    Authorization - Number of Visits  10    PT Start Time  7867    PT Stop Time  1115    PT Time Calculation (min)  40 min    Activity Tolerance  Patient tolerated treatment well    Behavior During Therapy  [facility] for tasks assessed/performed;Restless did express some reports of pain with packing today       Past Medical History:  Diagnosis Date  . Anxiety neurosis   . Arrhythmia    afib  . Atrial fib/flutter, transient   . BPH (benign prostatic hyperplasia)   . Coronary artery disease    MINOR  . Hyperlipidemia   . OBS (organic brain syndrome)   . Orthostatic hypotension   . Syncope and collapse   . Vertigo     Past Surgical History:  Procedure Laterality Date  . APPENDECTOMY    . CARDIAC CATHETERIZATION  06/17/97   FALSE/POSITIVE TREADMILL EXERCISE TEST. MILD CAD WITH NL LV FUNCTION. MILD HYPERTENSION. FAMILY HX OF CAD, REMOTE SMOKER. PAST HX OF PAF.  Marland Kitchen CAROTID DUPLEX Bilateral 03/13/01   SM AMT OF NON HEMODYNAMICALLY SIGN PLAQUE IN THE RIGHT CAROTID BULB, <39% STENOSIS.  Marland Kitchen COLONOSCOPY  10/20/2011   Procedure: COLONOSCOPY;  Surgeon: Rogene Houston, MD;  [facility];  Service: Endoscopy;  Laterality: N/A;  . CRANIOTOMY N/A 02/15/2013   Procedure: CRANIOTOMY HEMATOMA EVACUATION SUBDURAL;  Surgeon: Hosie Spangle, MD;  Location: [place] NEURO ORS;  Service: Neurosurgery;  Laterality: N/A;  .  DOPPLER ECHOCARDIOGRAPHY N/A 02/01/12   LV CAVITY SIZE IS NORMAL. MILD CONCENTRIC HYPERTROPHY. EF 55-60%. PARAMETERS ARE CONSISTANT WITH ABNORMAL LV RELAXATION.(GRADE 1 DIASTOLIC DYSFUNCTION). MR. LEFT ATRIUM MODERATELY DILATED. RIGHT ATRIUM MILDLY DILATED. ATRIAL SEPTUM NORMAL. ATRIAL PACED RHYTHM WITH INTACT A-V CONDUCTION.  Marland Kitchen enrhythm generator     implant of a medtronic generator  . interrogation     atrial and ventricular  . NUCLEAR STRESS TEST N/A 03/18/09   NORMAL PATTERN OF PERFUSION IN ALL REGIONS. LEFT VENTRICULAR SIZE IS NORMAL. NO EVID OF INDUCIBLE ISCHEMIA. EF 77%. NORMAL MYOCARDIAL PERFUSION STUDY.  Marland Kitchen PACEMAKER GENERATOR CHANGE N/A 07/24/2013   Procedure: PACEMAKER GENERATOR CHANGE;  Surgeon: Sanda Klein, MD;  Location: [place] CATH LAB;  Service: Cardiovascular;  Laterality: N/A;  . PULSE GENERATOR IMPLANT      There were no vitals filed for this visit.              Wound Therapy - 04/25/17 1633    Subjective  PT mostly nonverbal.  Accompanied by caregiver    Patient and Family Stated Goals  Wound to heal and patient to not be in pain    Prior Treatments  home RN for 2 weeks, ended last week and recommended wound care referral    Pain Scale  Faces  Faces Pain Scale  No hurt    Evaluation and Treatment Procedures Explained to Patient/Family  Yes    Evaluation and Treatment Procedures  Patient unable to consent due to mental status caregiver consent to treatment    Pressure Injury Properties Date First Assessed: 04/13/17 Time First Assessed: 5784 Location: Sacrum Location Orientation: Posterior;Mid Staging: Stage IV - Full thickness tissue loss with exposed bone, tendon or muscle. Wound Description (Comments): large open wound with yellow/greenish slough present in openign and on wound bed and adheared to wound margins Present on Admission: Yes   Dressing Type  Silver hydrofiber;Gauze (Comment);Adhesive strips saline gauze packed, silver hydrofiber, sacral wound cover     Dressing  Changed;Old drainage (marked)    Dressing Change Frequency  PRN    State of Healing  Non-healing    Site / Wound Assessment  Granulation tissue;Yellow;Red    % Wound base Red or Granulating  70%    % Wound base Yellow/Fibrinous Exudate  30%    Peri-wound Assessment  Erythema (non-blanchable)    Margins  Epibole (rolled edges)    Drainage Amount  Moderate    Drainage Description  Purulent;Odor;Sanguineous    Treatment  Hydrotherapy (Pulse lavage);Debridement (Selective)    Pressure Injury Properties Date First Assessed: 04/13/17 Time First Assessed: 1124 Location: Sacrum Location Orientation: Right;Distal;Posterior Staging: Unstageable - Full thickness tissue loss in which the base of the ulcer is covered by slough (yellow, tan, gray, green or brown) and/or eschar (tan, brown or black) in the wound bed. Wound Description (Comments): located right and distally to central sacral wound Present on Admission: Yes   Pressure Injury Properties Date First Assessed: 04/13/17 Time First Assessed: 1124 Location: Sacrum Location Orientation: Left;Mid Staging: Deep Tissue Injury - Purple or maroon localized area of discolored intact skin or blood-filled blister due to damage of underlying soft tissue from pressure and/or shear. Wound Description (Comments): connected to and located on left/slightly supirior board of central sacral wound, located between 8 and 12 o'clock Present on Admission: Yes   Pulsed lavage therapy - wound location  Central sacral wound    Pulsed Lavage with Suction (psi)  4 psi    Pulsed Lavage with Suction - Normal Saline Used  1000 mL    Pulsed Lavage Tip  Tip with splash shield    Selective Debridement - Location  PLS complete on sacral wound for cleansing and selective debridement for removal of necrotic tissue to promote healing. Warm towel used prior to debridement for comfort.    Selective Debridement - Tools Used  Forceps;Scalpel    Selective Debridement - Tissue  Removed  purulent slough and necrotic tissue in wound bed and aroudn margins of wound    Wound Therapy - Clinical Statement  PLS completed with minimal sharps debridment needed following.  some slough perimeter of wound but easily removed.  Packing was dry and adherent inside wound bed.  Added hydrogel to gauze to help keep wound moist.  Silver hydrofiber used over to help absorb and reduce bacteria.  Pt did not appear to be in pain or show signs of pain during session today.      Wound Therapy - Functional Problem List  decreased mobility, dependent for pressure relief, poor nutritional intake    Factors Delaying/Impairing Wound Healing  Immobility    Hydrotherapy Plan  Debridement;Dressing change;Patient/family education;Pulsatile lavage with suction    Wound Therapy - Frequency  3X / week    Wound Therapy - Current Recommendations  PT  Wound Plan  continue pulsed lavage to throughly cleanse wound with sharps debridement as necessary and correct dressing to promote healing enviroment.   Measure on Wednesdays.      Dressing   saline and hydrogel soaked gauze packed into wound with silver hydrofiber and sacral patch covering.                 PT Short Term Goals - 04/13/17 1458      PT SHORT TERM GOAL #1   Title  Patient's family/caregiver will verbalize understanding of pressure sensitive areas and reports appropriate safe methods to prevent pressure sores from developing.     Time  2    Period  Weeks    Status  New    Target Date  05/25/17      PT SHORT TERM GOAL #2   Title  Patient's family/caregiver will verbalize understanding of pressure relief recommendations and report pressure relief schedule to improve skin integrity and decrease breakdown.    Time  2    Period  Weeks    Status  New      PT SHORT TERM GOAL #3   Title  Family/caregiver will demonstrate proper inflation and fit technique for wheelchair cushion to provide optimal pressure relief for patient's sacrum while  seated in wheelchair.    Time  4    Period  Weeks    Status  New    Target Date  05/11/17        PT Long Term Goals - 04/13/17 1502      PT LONG TERM GOAL #1   Title  Wound will decreas volume by 50% and there will be no signs of infection surround wound/periwound to show improved tissue health.    Time  8    Period  Weeks    Status  New    Target Date  06/08/17              Patient will benefit from skilled therapeutic intervention in order to improve the following deficits and impairments:     Visit Diagnosis: Pressure injury of sacral region, stage 4 (HCC)  Pressure injury of sacral region, unstageable ([place])  Other abnormalities of gait and mobility     Problem List Patient Active Problem List   Diagnosis Date Noted  . Symptomatic sinus bradycardia 07/24/2013  . Pacemaker battery depletion 07/17/2013  . Preoperative clearance 02/15/2013  . Subdural hematoma ([place]) 02/13/2013  . Falls frequently 11/15/2012  . Long term (current) use of anticoagulants 04/21/2012  . Hx of adenomatous colonic polyps 02/19/2012  . Constipation 10/07/2011  . UNSPECIFIED HYPOTHYROIDISM 06/18/2009  . Essential hypertension 06/18/2009  . ATRIAL FIBRILLATION 06/18/2009  . ORTHOSTATIC HYPOTENSION 06/18/2009  . SYNCOPE AND COLLAPSE 06/18/2009  . Cardiac pacemaker in situ 06/18/2009   Teena Irani, PTA/CLT [phone]  Teena Irani 04/25/2017, 5:21 PM  [place] [address] [place], [place], [address] Phone: [phone]   Fax:  [phone]  Name: KALEI MCKILLOP MRN: [id number] Date of Birth: 12-01-1924

## 2017-04-26 DIAGNOSIS — J441 Chronic obstructive pulmonary disease with (acute) exacerbation: Secondary | ICD-10-CM | POA: Diagnosis not present

## 2017-04-26 DIAGNOSIS — E038 Other specified hypothyroidism: Secondary | ICD-10-CM | POA: Diagnosis not present

## 2017-04-26 DIAGNOSIS — L89812 Pressure ulcer of head, stage 2: Secondary | ICD-10-CM | POA: Diagnosis not present

## 2017-04-27 ENCOUNTER — Other Ambulatory Visit: Payer: Self-pay

## 2017-04-27 ENCOUNTER — Ambulatory Visit (HOSPITAL_COMMUNITY): Payer: Medicare Other

## 2017-04-27 ENCOUNTER — Encounter (HOSPITAL_COMMUNITY): Payer: Self-pay

## 2017-04-27 DIAGNOSIS — L89154 Pressure ulcer of sacral region, stage 4: Secondary | ICD-10-CM

## 2017-04-27 DIAGNOSIS — R2689 Other abnormalities of gait and mobility: Secondary | ICD-10-CM | POA: Diagnosis not present

## 2017-04-27 DIAGNOSIS — L89150 Pressure ulcer of sacral region, unstageable: Secondary | ICD-10-CM

## 2017-04-27 NOTE — Therapy (Signed)
[place], [place], [address] Phone: [phone]   Fax:  [phone]  Wound Care Therapy  Patient Details  Name: Reginald [name] MRN: [id number] Date of Birth: 09-25-1924 Referring Provider: Neale Burly, MD   Encounter Date: 04/27/2017  PT End of Session - 04/27/17 1828    Visit Number  7    Number of Visits  25    Date for PT Re-Evaluation  06/08/17 minireassess 05/11/17    Authorization Type  [employer]    Authorization Time Period  04/13/17 - 06/08/17    Authorization - Visit Number  7    Authorization - Number of Visits  10    PT Start Time  1200    PT Stop Time  1250    PT Time Calculation (min)  50 min    Activity Tolerance  Patient tolerated treatment well    Behavior During Therapy  [facility] for tasks assessed/performed;Restless did express some reports of pain with selective debridement today       Past Medical History:  Diagnosis Date  . Anxiety neurosis   . Arrhythmia    afib  . Atrial fib/flutter, transient   . BPH (benign prostatic hyperplasia)   . Coronary artery disease    MINOR  . Hyperlipidemia   . OBS (organic brain syndrome)   . Orthostatic hypotension   . Syncope and collapse   . Vertigo     Past Surgical History:  Procedure Laterality Date  . APPENDECTOMY    . CARDIAC CATHETERIZATION  06/17/97   FALSE/POSITIVE TREADMILL EXERCISE TEST. MILD CAD WITH NL LV FUNCTION. MILD HYPERTENSION. FAMILY HX OF CAD, REMOTE SMOKER. PAST HX OF PAF.  Marland Kitchen CAROTID DUPLEX Bilateral 03/13/01   SM AMT OF NON HEMODYNAMICALLY SIGN PLAQUE IN THE RIGHT CAROTID BULB, <39% STENOSIS.  Marland Kitchen COLONOSCOPY  10/20/2011   Procedure: COLONOSCOPY;  Surgeon: Rogene Houston, MD;  [facility];  Service: Endoscopy;  Laterality: N/A;  . CRANIOTOMY N/A 02/15/2013   Procedure: CRANIOTOMY HEMATOMA EVACUATION SUBDURAL;  Surgeon: Hosie Spangle, MD;  Location: [place] NEURO ORS;  Service: Neurosurgery;   Laterality: N/A;  . DOPPLER ECHOCARDIOGRAPHY N/A 02/01/12   LV CAVITY SIZE IS NORMAL. MILD CONCENTRIC HYPERTROPHY. EF 55-60%. PARAMETERS ARE CONSISTANT WITH ABNORMAL LV RELAXATION.(GRADE 1 DIASTOLIC DYSFUNCTION). MR. LEFT ATRIUM MODERATELY DILATED. RIGHT ATRIUM MILDLY DILATED. ATRIAL SEPTUM NORMAL. ATRIAL PACED RHYTHM WITH INTACT A-V CONDUCTION.  Marland Kitchen enrhythm generator     implant of a medtronic generator  . interrogation     atrial and ventricular  . NUCLEAR STRESS TEST N/A 03/18/09   NORMAL PATTERN OF PERFUSION IN ALL REGIONS. LEFT VENTRICULAR SIZE IS NORMAL. NO EVID OF INDUCIBLE ISCHEMIA. EF 77%. NORMAL MYOCARDIAL PERFUSION STUDY.  Marland Kitchen PACEMAKER GENERATOR CHANGE N/A 07/24/2013   Procedure: PACEMAKER GENERATOR CHANGE;  Surgeon: Sanda Klein, MD;  Location: [facility] CATH LAB;  Service: Cardiovascular;  Laterality: N/A;  . PULSE GENERATOR IMPLANT      There were no vitals filed for this visit.    Wound Therapy - 04/27/17 1804    Subjective  Pt mostly nonverbal.  Accompanied by caregiver. Patient smiling on arrival.    Patient and Family Stated Goals  Wound to heal and patient to not be in pain    Prior Treatments  home RN for 2 weeks, ended last week and recommended wound care referral    Pain Scale  Faces    Faces Pain Scale  No  hurt    Evaluation and Treatment Procedures Explained to Patient/Family  Yes    Evaluation and Treatment Procedures  Patient unable to consent due to mental status caregiver consent to treatment    Pressure Injury Properties Date First Assessed: 04/13/17 Time First Assessed: 4818 Location: Sacrum Location Orientation: Posterior;Mid Staging: Stage IV - Full thickness tissue loss with exposed bone, tendon or muscle. Wound Description (Comments): large open wound with yellow/greenish slough present in openign and on wound bed and adheared to wound margins Present on Admission: Yes   Dressing Type  Silver hydrofiber;Gauze (Comment);Adhesive strips saline gauze (1 strip) to pack  wound; petrolleum jelly    Dressing  Changed;Old drainage (marked)    Dressing Change Frequency  PRN    State of Healing  Non-healing    Site / Wound Assessment  Granulation tissue;Yellow;Red    % Wound base Red or Granulating  70%    % Wound base Yellow/Fibrinous Exudate  30%    % Wound base Black/Eschar  -- dark brown/black tissue around edge, 6-7 o'clock, 1x0.5 cm    Peri-wound Assessment  Maceration;Pink    Wound Length (cm)  3.1 cm was 3.0    Wound Width (cm)  2.6 cm was 2.8    Wound Depth (cm)  1 cm was 1    Wound Surface Area (cm^2)  8.06 cm^2    Wound Volume (cm^3)  8.06 cm^3    Undermining (cm)  Undermining is 0.7 cm in depth extending from 3:00 to 4:00; 0.79cm in depth extending; from 6:00 to 8:00; 1.1 cm in depth extending from 7:00 to 8:00; 1.3 cm in depth extending from 8:00 to 10:00; 2.2 cm in depth extending from 10:00 to 11:00; and 1.0 cm in depth extending from 11:00 to 12:00    Margins  Epibole (rolled edges)    Drainage Amount  Copious    Drainage Description  Purulent;Odor    Treatment  Hydrotherapy (Pulse lavage);Cleansed;Debridement (Selective);Packing (Saline gauze)    Pressure Injury Properties Date First Assessed: 04/13/17 Time First Assessed: 1124 Location: Sacrum Location Orientation: Right;Distal;Posterior Staging: Unstageable - Full thickness tissue loss in which the base of the ulcer is covered by slough (yellow, tan, gray, green or brown) and/or eschar (tan, brown or black) in the wound bed. Wound Description (Comments): located right and distally to central sacral wound Present on Admission: Yes   State of Healing  Epithelialized    Site / Wound Assessment  Clean;Dry healed skin    Pressure Injury Properties Date First Assessed: 04/13/17 Time First Assessed: 1124 Location: Sacrum Location Orientation: Left;Mid Staging: Deep Tissue Injury - Purple or maroon localized area of discolored intact skin or blood-filled blister due to damage of underlying soft tissue  from pressure and/or shear. Wound Description (Comments): connected to and located on left/slightly supirior board of central sacral wound, located between 8 and 12 o'clock Present on Admission: Yes   State of Healing  -- difficult to assess; superficial epithelialized    Site / Wound Assessment  Clean;Dry;Pink    Pulsed lavage therapy - wound location  Central sacral wound    Pulsed Lavage with Suction (psi)  400 psi    Pulsed Lavage with Suction - Normal Saline Used  1000 mL    Pulsed Lavage Tip  Tip with splash shield    Selective Debridement - Location  PLS complete on sacral wound for cleansing and selective debridement for removal of necrotic tissue to promote healing. Warm towel used prior to debridement for comfort.  Selective Debridement - Tools Used  Forceps;Scalpel    Selective Debridement - Tissue Removed  purulent slough and necrotic tissue in wound bed and aroudn margins of wound    Wound Therapy - Clinical Statement  PLS completed with sharps debridement followed for removal of slough and necrotic tissue at edges. Decreased adherence of slough at base of wound some slough perimeter of wound but easily removed.  Copious exudate was present this session and therefore extra padding with abd pad was used to cover wound to prevent maceration. Silver hydrofiber also continued over wound to help absorb and reduce bacteria.  Central wound has decreased in size from 8.4 cm^3 to 8.06 cm^3 and undermining around edges from 3-6 o'clock is no longer present as well as undermining decreased in depth around entire wound. Second and third wound appear to be superficially  epithelialized and healing well, difficult to assess full healing of wound superiolateral to central wound due to undermining.     Wound Therapy - Functional Problem List  decreased mobility, dependent for pressure relief, poor nutritional intake    Factors Delaying/Impairing Wound Healing  Immobility    Hydrotherapy Plan   Debridement;Dressing change;Patient/family education;Pulsatile lavage with suction    Wound Therapy - Frequency  3X / week    Wound Therapy - Current Recommendations  PT    Wound Plan  Continue pulsed lavage to thoroughly cleanse wound with sharps debridement as necessary and correct dressing to promote healing environment. Continue with hydrogel in wound bed to improve packing removal and prevent damage to healthy tissue.  Measure on Wednesdays.      Dressing   saline soaked gauze packed into wound with silver hydrofiber, abd pad, and sacral patch covering.           PT Short Term Goals - 04/13/17 1458      PT SHORT TERM GOAL #1   Title  Patient's family/caregiver will verbalize understanding of pressure sensitive areas and reports appropriate safe methods to prevent pressure sores from developing.     Time  2    Period  Weeks    Status  New    Target Date  05/25/17      PT SHORT TERM GOAL #2   Title  Patient's family/caregiver will verbalize understanding of pressure relief recommendations and report pressure relief schedule to improve skin integrity and decrease breakdown.    Time  2    Period  Weeks    Status  New      PT SHORT TERM GOAL #3   Title  Family/caregiver will demonstrate proper inflation and fit technique for wheelchair cushion to provide optimal pressure relief for patient's sacrum while seated in wheelchair.    Time  4    Period  Weeks    Status  New    Target Date  05/11/17        PT Long Term Goals - 04/13/17 1502      PT LONG TERM GOAL #1   Title  Wound will decreas volume by 50% and there will be no signs of infection surround wound/periwound to show improved tissue health.    Time  8    Period  Weeks    Status  New    Target Date  06/08/17        Plan - 04/27/17 1828    Clinical Impression Statement  see above    Rehab Potential  Poor    Clinical Impairments Affecting Rehab Potential  decreased mobility.  PT Frequency  3x / week    PT  Duration  8 weeks    PT Treatment/Interventions  ADLs/Self Care Home Management;Patient/family education;Manual techniques;Therapeutic activities;Passive range of motion;Other (comment);Taping selective debridement, pulsed lavage, cleansing and dressing wound    PT Next Visit Plan  F/U with culture findings/antibiotics and ROJO purchase for chair.  Continue pulsed lavage to thoroughly cleanse wound with sharps debridement as necessary and correct dressing to promote healing environment. Continue with hydrogel in wound bed to improve packing removal and prevent damage to healthy tissue.  Measure on Wednesdays.      Consulted and Agree with Plan of Care  Patient;Family member/caregiver    Family Member Consulted  caregiver, Gerald Stabs       Patient will benefit from skilled therapeutic intervention in order to improve the following deficits and impairments:  Decreased skin integrity, Improper body mechanics, Pain, Decreased mobility  Visit Diagnosis: Pressure injury of sacral region, stage 4 (HCC)  Pressure injury of sacral region, unstageable ([place])  Other abnormalities of gait and mobility     Problem List Patient Active Problem List   Diagnosis Date Noted  . Symptomatic sinus bradycardia 07/24/2013  . Pacemaker battery depletion 07/17/2013  . Preoperative clearance 02/15/2013  . Subdural hematoma ([place]) 02/13/2013  . Falls frequently 11/15/2012  . Long term (current) use of anticoagulants 04/21/2012  . Hx of adenomatous colonic polyps 02/19/2012  . Constipation 10/07/2011  . UNSPECIFIED HYPOTHYROIDISM 06/18/2009  . Essential hypertension 06/18/2009  . ATRIAL FIBRILLATION 06/18/2009  . ORTHOSTATIC HYPOTENSION 06/18/2009  . SYNCOPE AND COLLAPSE 06/18/2009  . Cardiac pacemaker in situ 06/18/2009    [name], PT, DPT Physical Therapist with [facility]  04/27/2017 6:29 PM    [place], [place], [address] Phone: [phone]   Fax:  [phone]  Name: EMRY BARBATO MRN: [id number] Date of Birth: 08-18-1924

## 2017-04-29 ENCOUNTER — Encounter

## 2017-05-02 ENCOUNTER — Ambulatory Visit (HOSPITAL_COMMUNITY): Payer: Medicare Other | Admitting: Physical Therapy

## 2017-05-02 ENCOUNTER — Encounter

## 2017-05-02 DIAGNOSIS — L89150 Pressure ulcer of sacral region, unstageable: Secondary | ICD-10-CM | POA: Diagnosis not present

## 2017-05-02 DIAGNOSIS — R2689 Other abnormalities of gait and mobility: Secondary | ICD-10-CM

## 2017-05-02 DIAGNOSIS — L89154 Pressure ulcer of sacral region, stage 4: Secondary | ICD-10-CM

## 2017-05-02 NOTE — Therapy (Signed)
[place], [place], [address] Phone: [phone]   Fax:  [phone]  Wound Care Therapy  Patient Details  Name: Reginald [name] MRN: [id number] Date of Birth: February 17, 1924 Referring Provider: Neale Burly, MD   Encounter Date: 05/02/2017  PT End of Session - 05/02/17 1400    Visit Number  8    Number of Visits  25    Date for PT Re-Evaluation  06/08/17 minireassess 05/11/17    Authorization Type  [employer]IC[employer]    Authorization Time Period  04/13/17 - 06/08/17    Authorization - Visit Number  8    Authorization - Number of Visits  10    PT Start Time  4650    PT Stop Time  1340    PT Time Calculation (min)  38 min    Activity Tolerance  Patient tolerated treatment well    Behavior During Therapy  [facility] for tasks assessed/performed;Restless did express some reports of pain with selective debridement today       Past Medical History:  Diagnosis Date  . Anxiety neurosis   . Arrhythmia    afib  . Atrial fib/flutter, transient   . BPH (benign prostatic hyperplasia)   . Coronary artery disease    MINOR  . Hyperlipidemia   . OBS (organic brain syndrome)   . Orthostatic hypotension   . Syncope and collapse   . Vertigo     Past Surgical History:  Procedure Laterality Date  . APPENDECTOMY    . CARDIAC CATHETERIZATION  06/17/97   FALSE/POSITIVE TREADMILL EXERCISE TEST. MILD CAD WITH NL LV FUNCTION. MILD HYPERTENSION. FAMILY HX OF CAD, REMOTE SMOKER. PAST HX OF PAF.  Marland Kitchen CAROTID DUPLEX Bilateral 03/13/01   SM AMT OF NON HEMODYNAMICALLY SIGN PLAQUE IN THE RIGHT CAROTID BULB, <39% STENOSIS.  Marland Kitchen COLONOSCOPY  10/20/2011   Procedure: COLONOSCOPY;  Surgeon: Rogene Houston, MD;  [facility];  Service: Endoscopy;  Laterality: N/A;  . CRANIOTOMY N/A 02/15/2013   Procedure: CRANIOTOMY HEMATOMA EVACUATION SUBDURAL;  Surgeon: Hosie Spangle, MD;  Location: [place] NEURO ORS;  Service: Neurosurgery;   Laterality: N/A;  . DOPPLER ECHOCARDIOGRAPHY N/A 02/01/12   LV CAVITY SIZE IS NORMAL. MILD CONCENTRIC HYPERTROPHY. EF 55-60%. PARAMETERS ARE CONSISTANT WITH ABNORMAL LV RELAXATION.(GRADE 1 DIASTOLIC DYSFUNCTION). MR. LEFT ATRIUM MODERATELY DILATED. RIGHT ATRIUM MILDLY DILATED. ATRIAL SEPTUM NORMAL. ATRIAL PACED RHYTHM WITH INTACT A-V CONDUCTION.  Marland Kitchen enrhythm generator     implant of a medtronic generator  . interrogation     atrial and ventricular  . NUCLEAR STRESS TEST N/A 03/18/09   NORMAL PATTERN OF PERFUSION IN ALL REGIONS. LEFT VENTRICULAR SIZE IS NORMAL. NO EVID OF INDUCIBLE ISCHEMIA. EF 77%. NORMAL MYOCARDIAL PERFUSION STUDY.  Marland Kitchen PACEMAKER GENERATOR CHANGE N/A 07/24/2013   Procedure: PACEMAKER GENERATOR CHANGE;  Surgeon: Sanda Klein, MD;  Location: [place] CATH LAB;  Service: Cardiovascular;  Laterality: N/A;  . PULSE GENERATOR IMPLANT      There were no vitals filed for this visit.              Wound Therapy - 05/02/17 1352    Subjective  erbal.  Accompanied by caregiver. Patient smiling on arrival. No pain voiced    Patient and Family Stated Goals  Wound to heal and patient to not be in pain    Prior Treatments  home RN for 2 weeks, ended last week and recommended wound care referral    Faces  Pain Scale  No hurt    Evaluation and Treatment Procedures Explained to Patient/Family  Yes    Evaluation and Treatment Procedures  Patient unable to consent due to mental status caregiver consent to treatment    Pressure Injury Properties Date First Assessed: 04/13/17 Time First Assessed: 2423 Location: Sacrum Location Orientation: Posterior;Mid Staging: Stage IV - Full thickness tissue loss with exposed bone, tendon or muscle. Wound Description (Comments): large open wound with yellow/greenish slough present in openign and on wound bed and adheared to wound margins Present on Admission: Yes   Dressing Type  Silver hydrofiber;Gauze (Comment);Adhesive strips saline gauze (1 strip) to pack  wound; petrolleum jelly    Dressing  Changed;Old drainage (marked)    Dressing Change Frequency  PRN    State of Healing  Non-healing    Site / Wound Assessment  Granulation tissue;Yellow;Red    % Wound base Red or Granulating  85%    % Wound base Yellow/Fibrinous Exudate  15%    % Wound base Black/Eschar  -- dark brown/black tissue around edge, 6-7 o'clock, 1x0.5 cm    Peri-wound Assessment  Maceration;Pink    Margins  Epibole (rolled edges)    Drainage Amount  Copious    Drainage Description  Purulent;Odor    Treatment  Hydrotherapy (Pulse lavage);Debridement (Selective)    Pressure Injury Properties Date First Assessed: 04/13/17 Time First Assessed: 1124 Location: Sacrum Location Orientation: Right;Distal;Posterior Staging: Unstageable - Full thickness tissue loss in which the base of the ulcer is covered by slough (yellow, tan, gray, green or brown) and/or eschar (tan, brown or black) in the wound bed. Wound Description (Comments): located right and distally to central sacral wound Present on Admission: Yes   State of Healing  Epithelialized    Site / Wound Assessment  Clean;Dry healed skin    Pressure Injury Properties Date First Assessed: 04/13/17 Time First Assessed: 1124 Location: Sacrum Location Orientation: Left;Mid Staging: Deep Tissue Injury - Purple or maroon localized area of discolored intact skin or blood-filled blister due to damage of underlying soft tissue from pressure and/or shear. Wound Description (Comments): connected to and located on left/slightly supirior board of central sacral wound, located between 8 and 12 o'clock Present on Admission: Yes   State of Healing  -- difficult to assess; superficial epithelialized    Site / Wound Assessment  Clean;Dry;Pink    Pulsed lavage therapy - wound location  Central sacral wound    Pulsed Lavage with Suction (psi)  4 psi    Pulsed Lavage with Suction - Normal Saline Used  1000 mL    Pulsed Lavage Tip  Tip with splash shield     Selective Debridement - Location  packing stuck within woundbed, however noted saturation of dressings.  irrigated prior to cleansing to loosen packing and decrease disruption of granulation.  Debrided wound border with sharps and cleansed well piror to packing with saline soaked gauze and additional hydrogel to decrease adhesion/drying out of packing.  Placed 2 ply of silver hydro and 2-ply of ABD prior to securing with sacral dressing. Wound bed is relatively clean with most depth from 6 to 12 0'clock.      Selective Debridement - Tools Used  Forceps;Scalpel    Selective Debridement - Tissue Removed  purulent slough and necrotic tissue in wound bed and aroudn margins of wound    Wound Therapy - Clinical Statement  PLS completed with sharps debridement followed for removal of slough and necrotic tissue at edges. Decreased adherence of  slough at base of wound some slough perimeter of wound but easily removed.  Copious exudate was present this session and therefore extra padding with abd pad was used to cover wound to prevent maceration. Silver hydrofiber also continued over wound to help absorb and reduce bacteria.  Central wound has decreased in size from 8.4 cm^3 to 8.06 cm^3 and undermining around edges from 3-6 o'clock is no longer present as well as undermining decreased in depth around entire wound. Second and third wound appear to be superficially  epithelialized and healing well, difficult to assess full healing of wound superiolateral to central wound due to undermining.     Wound Therapy - Functional Problem List  decreased mobility, dependent for pressure relief, poor nutritional intake    Factors Delaying/Impairing Wound Healing  Immobility    Hydrotherapy Plan  Debridement;Dressing change;Patient/family education;Pulsatile lavage with suction    Wound Therapy - Frequency  3X / week    Wound Therapy - Current Recommendations  PT    Wound Plan  Continue pulsed lavage to thoroughly cleanse wound  with sharps debridement as necessary and correct dressing to promote healing environment. Continue with hydrogel in wound bed to improve packing removal and prevent damage to healthy tissue.  Measure on Wednesdays.      Dressing   saline soaked gauze packed into wound with silver hydrofiber, abd pad, and sacral patch covering.                 PT Short Term Goals - 04/13/17 1458      PT SHORT TERM GOAL #1   Title  Patient's family/caregiver will verbalize understanding of pressure sensitive areas and reports appropriate safe methods to prevent pressure sores from developing.     Time  2    Period  Weeks    Status  New    Target Date  05/25/17      PT SHORT TERM GOAL #2   Title  Patient's family/caregiver will verbalize understanding of pressure relief recommendations and report pressure relief schedule to improve skin integrity and decrease breakdown.    Time  2    Period  Weeks    Status  New      PT SHORT TERM GOAL #3   Title  Family/caregiver will demonstrate proper inflation and fit technique for wheelchair cushion to provide optimal pressure relief for patient's sacrum while seated in wheelchair.    Time  4    Period  Weeks    Status  New    Target Date  05/11/17        PT Long Term Goals - 04/13/17 1502      PT LONG TERM GOAL #1   Title  Wound will decreas volume by 50% and there will be no signs of infection surround wound/periwound to show improved tissue health.    Time  8    Period  Weeks    Status  New    Target Date  06/08/17              Patient will benefit from skilled therapeutic intervention in order to improve the following deficits and impairments:     Visit Diagnosis: Pressure injury of sacral region, stage 4 (HCC)  Pressure injury of sacral region, unstageable ([place])  Other abnormalities of gait and mobility     Problem List Patient Active Problem List   Diagnosis Date Noted  . Symptomatic sinus bradycardia 07/24/2013  .  Pacemaker battery depletion 07/17/2013  . Preoperative  clearance 02/15/2013  . Subdural hematoma ([place]) 02/13/2013  . Falls frequently 11/15/2012  . Long term (current) use of anticoagulants 04/21/2012  . Hx of adenomatous colonic polyps 02/19/2012  . Constipation 10/07/2011  . UNSPECIFIED HYPOTHYROIDISM 06/18/2009  . Essential hypertension 06/18/2009  . ATRIAL FIBRILLATION 06/18/2009  . ORTHOSTATIC HYPOTENSION 06/18/2009  . SYNCOPE AND COLLAPSE 06/18/2009  . Cardiac pacemaker in situ 06/18/2009   Teena Irani, PTA/CLT [id number]  Teena Irani 05/02/2017, 2:01 PM  [place] [address] [place], [place], [address] Phone: [phone]   Fax:  [phone]  Name: Reginald [name] MRN: [id number] Date of Birth: [birth year]

## 2017-05-04 ENCOUNTER — Ambulatory Visit (HOSPITAL_COMMUNITY): Payer: Medicare Other | Attending: Internal Medicine

## 2017-05-04 ENCOUNTER — Encounter (HOSPITAL_COMMUNITY): Payer: Self-pay

## 2017-05-04 DIAGNOSIS — L89154 Pressure ulcer of sacral region, stage 4: Secondary | ICD-10-CM | POA: Insufficient documentation

## 2017-05-04 DIAGNOSIS — L89150 Pressure ulcer of sacral region, unstageable: Secondary | ICD-10-CM | POA: Diagnosis not present

## 2017-05-04 DIAGNOSIS — R2689 Other abnormalities of gait and mobility: Secondary | ICD-10-CM | POA: Insufficient documentation

## 2017-05-04 NOTE — Therapy (Signed)
[place], [place], [address] Phone: [phone]   Fax:  [phone]  Wound Care Therapy  Patient Details  Name: Reginald [name] MRN: [id number] Date of Birth: 10/02/1924 Referring Provider: Neale Burly, MD   Encounter Date: 05/04/2017  PT End of Session - 05/04/17 1511    Visit Number  9    Number of Visits  25    Date for PT Re-Evaluation  06/08/17 Mini-reassess 05/11/17    Authorization Type  [employer]    Authorization Time Period  04/13/17 - 06/08/17    Authorization - Visit Number  9    Authorization - Number of Visits  10    PT Start Time  5170    PT Stop Time  1513    PT Time Calculation (min)  38 min    Activity Tolerance  Patient tolerated treatment well    Behavior During Therapy  [facility] for tasks assessed/performed;Restless did moan while packing dressings       Past Medical History:  Diagnosis Date  . Anxiety neurosis   . Arrhythmia    afib  . Atrial fib/flutter, transient   . BPH (benign prostatic hyperplasia)   . Coronary artery disease    MINOR  . Hyperlipidemia   . OBS (organic brain syndrome)   . Orthostatic hypotension   . Syncope and collapse   . Vertigo     Past Surgical History:  Procedure Laterality Date  . APPENDECTOMY    . CARDIAC CATHETERIZATION  06/17/97   FALSE/POSITIVE TREADMILL EXERCISE TEST. MILD CAD WITH NL LV FUNCTION. MILD HYPERTENSION. FAMILY HX OF CAD, REMOTE SMOKER. PAST HX OF PAF.  Marland Kitchen CAROTID DUPLEX Bilateral 03/13/01   SM AMT OF NON HEMODYNAMICALLY SIGN PLAQUE IN THE RIGHT CAROTID BULB, <39% STENOSIS.  Marland Kitchen COLONOSCOPY  10/20/2011   Procedure: COLONOSCOPY;  Surgeon: Rogene Houston, MD;  [facility];  Service: Endoscopy;  Laterality: N/A;  . CRANIOTOMY N/A 02/15/2013   Procedure: CRANIOTOMY HEMATOMA EVACUATION SUBDURAL;  Surgeon: Hosie Spangle, MD;  Location: [facility] NEURO ORS;  Service: Neurosurgery;  Laterality: N/A;  . DOPPLER  ECHOCARDIOGRAPHY N/A 02/01/12   LV CAVITY SIZE IS NORMAL. MILD CONCENTRIC HYPERTROPHY. EF 55-60%. PARAMETERS ARE CONSISTANT WITH ABNORMAL LV RELAXATION.(GRADE 1 DIASTOLIC DYSFUNCTION). MR. LEFT ATRIUM MODERATELY DILATED. RIGHT ATRIUM MILDLY DILATED. ATRIAL SEPTUM NORMAL. ATRIAL PACED RHYTHM WITH INTACT A-V CONDUCTION.  Marland Kitchen enrhythm generator     implant of a medtronic generator  . interrogation     atrial and ventricular  . NUCLEAR STRESS TEST N/A 03/18/09   NORMAL PATTERN OF PERFUSION IN ALL REGIONS. LEFT VENTRICULAR SIZE IS NORMAL. NO EVID OF INDUCIBLE ISCHEMIA. EF 77%. NORMAL MYOCARDIAL PERFUSION STUDY.  Marland Kitchen PACEMAKER GENERATOR CHANGE N/A 07/24/2013   Procedure: PACEMAKER GENERATOR CHANGE;  Surgeon: Sanda Klein, MD;  Location: [facility] CATH LAB;  Service: Cardiovascular;  Laterality: N/A;  . PULSE GENERATOR IMPLANT      There were no vitals filed for this visit.   Subjective Assessment - 05/04/17 1510    Subjective  Pt arrived with dressing intact, no reports of discomfort or pain.  Pt nonverbal, did arrive with big smile on face.    Currently in Pain?  No/denies                Wound Therapy - 05/04/17 1512    Subjective  Pt arrived with dressing intact, no reports of discomfort or pain.  Pt nonverbal, did arrive  with big smile on face.    Patient and Family Stated Goals  Wound to heal and patient to not be in pain    Prior Treatments  home RN for 2 weeks, ended last week and recommended wound care referral    Pain Scale  Faces    Faces Pain Scale  No hurt    Pain Intervention(s)  Emotional support blanket for comfort    Evaluation and Treatment Procedures Explained to Patient/Family  Yes    Evaluation and Treatment Procedures  Patient unable to consent due to mental status caregiver consent for tx    Pressure Injury Properties Date First Assessed: 04/13/17 Time First Assessed: 2202 Location: Sacrum Location Orientation: Posterior;Mid Staging: Stage IV - Full thickness tissue loss  with exposed bone, tendon or muscle. Wound Description (Comments): large open wound with yellow/greenish slough present in openign and on wound bed and adheared to wound margins Present on Admission: Yes   Dressing Type  Silver hydrofiber;Gauze (Comment);Adhesive strips;Hydrogel saline and hydrogel gauze, silver hydrofiber, ABD and sacral    Dressing  Changed;Old drainage (marked)    Dressing Change Frequency  PRN    State of Healing  Non-healing    Site / Wound Assessment  Granulation tissue;Yellow;Red    % Wound base Red or Granulating  85%    % Wound base Yellow/Fibrinous Exudate  15%    Peri-wound Assessment  Pink    Wound Length (cm)  3.1 cm was 3.1    Wound Width (cm)  2.5 cm was 2.6    Wound Depth (cm)  1 cm was 1cm    Wound Surface Area (cm^2)  7.75 cm^2    Wound Volume (cm^3)  7.75 cm^3    Undermining (cm)  Undermining is .6 (was 1.0 cm) in depth extending from 1:00 to 4:00; 0.4 (was 0.7 cm) in depth extending from 4:00 to 6:00; 0.4 (was 0.6 cm) in depth extending from 6:00 to 8:00; 1.25 cm (was 1.5 cm) in depth extending from 8:00 to 10:00; and 1.75cm (was 2.2 cm) in depth extending from 10:00 to 1:00    Margins  Epibole (rolled edges)    Drainage Amount  Copious    Drainage Description  Serosanguineous;Odor    Treatment  Hydrotherapy (Pulse lavage);Cleansed;Debridement (Selective)    Pressure Injury Properties Date First Assessed: 04/13/17 Time First Assessed: 1124 Location: Sacrum Location Orientation: Right;Distal;Posterior Staging: Unstageable - Full thickness tissue loss in which the base of the ulcer is covered by slough (yellow, tan, gray, green or brown) and/or eschar (tan, brown or black) in the wound bed. Wound Description (Comments): located right and distally to central sacral wound Present on Admission: Yes   Pressure Injury Properties Date First Assessed: 04/13/17 Time First Assessed: 1124 Location: Sacrum Location Orientation: Left;Mid Staging: Deep Tissue Injury - Purple  or maroon localized area of discolored intact skin or blood-filled blister due to damage of underlying soft tissue from pressure and/or shear. Wound Description (Comments): connected to and located on left/slightly supirior board of central sacral wound, located between 8 and 12 o'clock Present on Admission: Yes   Pulsed lavage therapy - wound location  Central sacral wound    Pulsed Lavage with Suction (psi)  4 psi    Pulsed Lavage with Suction - Normal Saline Used  1000 mL    Pulsed Lavage Tip  Tip with splash shield    Selective Debridement - Location  sacral wound    Selective Debridement - Tools Used  Forceps;Scalpel  Selective Debridement - Tissue Removed  purulent slough and necrotic tissue in wound bed and aroudn margins of wound    Wound Therapy - Clinical Statement  Continued with PLS and sharp debridement for wound healing.  Selective debridement for removal of slough and necrotic tissue in wound bed.  Noted odor this session, discussed with caregiver if have received call/order for antibiotics from physician.  Caregiver expressed that he is unaware if daughter has received any information.      Wound Therapy - Functional Problem List  decreased mobility, dependent for pressure relief, poor nutritional intake    Factors Delaying/Impairing Wound Healing  Immobility    Hydrotherapy Plan  Debridement;Dressing change;Patient/family education;Pulsatile lavage with suction    Wound Therapy - Frequency  3X / week    Wound Therapy - Current Recommendations  PT    Wound Plan  Continue pulsed lavage to thoroughly cleanse wound with sharps debridement as necessary and correct dressing to promote healing environment. Continue with hydrogel in wound bed to improve packing removal and prevent damage to healthy tissue.  Measure on Wednesdays.      Dressing   saline soaked gauze packed into wound with silver hydrofiber, abd pad, and sacral patch covering.                 PT Short Term Goals  - 04/13/17 1458      PT SHORT TERM GOAL #1   Title  Patient's family/caregiver will verbalize understanding of pressure sensitive areas and reports appropriate safe methods to prevent pressure sores from developing.     Time  2    Period  Weeks    Status  New    Target Date  05/25/17      PT SHORT TERM GOAL #2   Title  Patient's family/caregiver will verbalize understanding of pressure relief recommendations and report pressure relief schedule to improve skin integrity and decrease breakdown.    Time  2    Period  Weeks    Status  New      PT SHORT TERM GOAL #3   Title  Family/caregiver will demonstrate proper inflation and fit technique for wheelchair cushion to provide optimal pressure relief for patient's sacrum while seated in wheelchair.    Time  4    Period  Weeks    Status  New    Target Date  05/11/17        PT Long Term Goals - 04/13/17 1502      PT LONG TERM GOAL #1   Title  Wound will decreas volume by 50% and there will be no signs of infection surround wound/periwound to show improved tissue health.    Time  8    Period  Weeks    Status  New    Target Date  06/08/17              Patient will benefit from skilled therapeutic intervention in order to improve the following deficits and impairments:     Visit Diagnosis: Pressure injury of sacral region, stage 4 (HCC)  Pressure injury of sacral region, unstageable ([place])  Other abnormalities of gait and mobility     Problem List Patient Active Problem List   Diagnosis Date Noted  . Symptomatic sinus bradycardia 07/24/2013  . Pacemaker battery depletion 07/17/2013  . Preoperative clearance 02/15/2013  . Subdural hematoma ([facility]) 02/13/2013  . Falls frequently 11/15/2012  . Long term (current) use of anticoagulants 04/21/2012  . Hx of adenomatous colonic polyps  02/19/2012  . Constipation 10/07/2011  . UNSPECIFIED HYPOTHYROIDISM 06/18/2009  . Essential hypertension 06/18/2009  . ATRIAL  FIBRILLATION 06/18/2009  . ORTHOSTATIC HYPOTENSION 06/18/2009  . SYNCOPE AND COLLAPSE 06/18/2009  . Cardiac pacemaker in situ 06/18/2009   Ihor Austin, [place]; [place]  Aldona Lento 05/04/2017, 5:53 PM  [facility] [address] [place], [place], [address] Phone: [phone]   Fax:  [phone]  Name: Reginald [name] MRN: [id number] Date of Birth: [birth year]

## 2017-05-04 NOTE — Telephone Encounter (Signed)
Attempted to call daughter concerning father's culture and if they have received antibiotics.  No answer, will try again at a later date.  [address] [place]; [facility] [phone]'

## 2017-05-06 ENCOUNTER — Ambulatory Visit (HOSPITAL_COMMUNITY): Payer: Medicare Other

## 2017-05-06 ENCOUNTER — Encounter (HOSPITAL_COMMUNITY): Payer: Self-pay

## 2017-05-06 DIAGNOSIS — L89154 Pressure ulcer of sacral region, stage 4: Secondary | ICD-10-CM

## 2017-05-06 DIAGNOSIS — R2689 Other abnormalities of gait and mobility: Secondary | ICD-10-CM

## 2017-05-06 DIAGNOSIS — L89150 Pressure ulcer of sacral region, unstageable: Secondary | ICD-10-CM

## 2017-05-06 NOTE — Therapy (Signed)
[place], [place], [address] Phone: [phone]   Fax:  [phone]  Wound Care Therapy  Patient Details  Name: Reginald [name] MRN: [id number] Date of Birth: 09-13-1924 Referring Provider: Neale Burly, MD   Encounter Date: 05/06/2017  PT End of Session - 05/06/17 0855    Visit Number  10    Number of Visits  25    Date for PT Re-Evaluation  06/08/17 Mini-reassess 05/11/17    Authorization Type  [employer]    Authorization Time Period  04/13/17 - 06/08/17    Authorization - Visit Number  10    Authorization - Number of Visits  10    PT Start Time  0815    PT Stop Time  0853    PT Time Calculation (min)  38 min    Activity Tolerance  Patient tolerated treatment well    Behavior During Therapy  [facility] for tasks assessed/performed;Restless       Past Medical History:  Diagnosis Date  . Anxiety neurosis   . Arrhythmia    afib  . Atrial fib/flutter, transient   . BPH (benign prostatic hyperplasia)   . Coronary artery disease    MINOR  . Hyperlipidemia   . OBS (organic brain syndrome)   . Orthostatic hypotension   . Syncope and collapse   . Vertigo     Past Surgical History:  Procedure Laterality Date  . APPENDECTOMY    . CARDIAC CATHETERIZATION  06/17/97   FALSE/POSITIVE TREADMILL EXERCISE TEST. MILD CAD WITH NL LV FUNCTION. MILD HYPERTENSION. FAMILY HX OF CAD, REMOTE SMOKER. PAST HX OF PAF.  Marland Kitchen CAROTID DUPLEX Bilateral 03/13/01   SM AMT OF NON HEMODYNAMICALLY SIGN PLAQUE IN THE RIGHT CAROTID BULB, <39% STENOSIS.  Marland Kitchen COLONOSCOPY  10/20/2011   Procedure: COLONOSCOPY;  Surgeon: Rogene Houston, MD;  [facility];  Service: Endoscopy;  Laterality: N/A;  . CRANIOTOMY N/A 02/15/2013   Procedure: CRANIOTOMY HEMATOMA EVACUATION SUBDURAL;  Surgeon: Hosie Spangle, MD;  Location: [facility] NEURO ORS;  Service: Neurosurgery;  Laterality: N/A;  . DOPPLER ECHOCARDIOGRAPHY N/A 02/01/12   LV CAVITY  SIZE IS NORMAL. MILD CONCENTRIC HYPERTROPHY. EF 55-60%. PARAMETERS ARE CONSISTANT WITH ABNORMAL LV RELAXATION.(GRADE 1 DIASTOLIC DYSFUNCTION). MR. LEFT ATRIUM MODERATELY DILATED. RIGHT ATRIUM MILDLY DILATED. ATRIAL SEPTUM NORMAL. ATRIAL PACED RHYTHM WITH INTACT A-V CONDUCTION.  Marland Kitchen enrhythm generator     implant of a medtronic generator  . interrogation     atrial and ventricular  . NUCLEAR STRESS TEST N/A 03/18/09   NORMAL PATTERN OF PERFUSION IN ALL REGIONS. LEFT VENTRICULAR SIZE IS NORMAL. NO EVID OF INDUCIBLE ISCHEMIA. EF 77%. NORMAL MYOCARDIAL PERFUSION STUDY.  Marland Kitchen PACEMAKER GENERATOR CHANGE N/A 07/24/2013   Procedure: PACEMAKER GENERATOR CHANGE;  Surgeon: Sanda Klein, MD;  Location: [facility] CATH LAB;  Service: Cardiovascular;  Laterality: N/A;  . PULSE GENERATOR IMPLANT      There were no vitals filed for this visit.   Subjective Assessment - 05/06/17 1059    Subjective  Pt arrived wiht dressing intact, no reports of discomfort or pain.  Care taker reports daughter has not commented on receiving call about antibiotics or purchased WC cushion.    Currently in Pain?  No/denies Pt non-verbal, no moans of discomfort through session                Wound Therapy - 05/06/17 1103    Subjective  Pt arrived wiht dressing intact, no  reports of discomfort or pain.  Care taker reports daughter has not commented on receiving call about antibiotics or purchased WC cushion.    Patient and Family Stated Goals  Wound to heal and patient to not be in pain    Prior Treatments  home RN for 2 weeks, ended last week and recommended wound care referral    Pain Scale  Faces    Faces Pain Scale  No hurt    Evaluation and Treatment Procedures Explained to Patient/Family  Yes    Evaluation and Treatment Procedures  Patient unable to consent due to mental status caregiver consent to tx    Pressure Injury Properties Date First Assessed: 04/13/17 Time First Assessed: 0737 Location: Sacrum Location Orientation:  Posterior;Mid Staging: Stage IV - Full thickness tissue loss with exposed bone, tendon or muscle. Wound Description (Comments): large open wound with yellow/greenish slough present in openign and on wound bed and adheared to wound margins Present on Admission: Yes   Dressing Type  Silver hydrofiber;Gauze (Comment);Adhesive strips;Hydrogel Saline/hydrogel gauze, silver hydrofiber, ABD pad, sacral co    Dressing  Changed;Old drainage (marked)    Dressing Change Frequency  PRN    State of Healing  Non-healing    Site / Wound Assessment  Granulation tissue;Yellow;Red    % Wound base Red or Granulating  90%    % Wound base Yellow/Fibrinous Exudate  10%    Peri-wound Assessment  Pink    Undermining (cm)  Undermining present     Margins  Epibole (rolled edges)    Drainage Amount  Copious    Drainage Description  Serosanguineous;Odor    Treatment  Cleansed;Debridement (Selective);Hydrotherapy (Pulse lavage)    Pressure Injury Properties Date First Assessed: 04/13/17 Time First Assessed: 1124 Location: Sacrum Location Orientation: Right;Distal;Posterior Staging: Unstageable - Full thickness tissue loss in which the base of the ulcer is covered by slough (yellow, tan, gray, green or brown) and/or eschar (tan, brown or black) in the wound bed. Wound Description (Comments): located right and distally to central sacral wound Present on Admission: Yes   Pressure Injury Properties Date First Assessed: 04/13/17 Time First Assessed: 1124 Location: Sacrum Location Orientation: Left;Mid Staging: Deep Tissue Injury - Purple or maroon localized area of discolored intact skin or blood-filled blister due to damage of underlying soft tissue from pressure and/or shear. Wound Description (Comments): connected to and located on left/slightly supirior board of central sacral wound, located between 8 and 12 o'clock Present on Admission: Yes   Pulsed lavage therapy - wound location  Central sacral wound    Pulsed Lavage with  Suction (psi)  4 psi    Pulsed Lavage with Suction - Normal Saline Used  1000 mL    Pulsed Lavage Tip  Tip with splash shield    Selective Debridement - Location  sacral wound    Selective Debridement - Tools Used  Forceps;Scalpel    Selective Debridement - Tissue Removed  purulent slough and necrotic tissue in wound bed and aroudn margins of wound    Wound Therapy - Clinical Statement  Continued with PLS and sharp debridement for removal of slough and dead skin perimeter of wound to promote healing.  Odor continues, no reports of receiving call about antibiotics.  Discussed with evaluation PT who plans to call MD about antibiotics.  Pt continues to have purulent exudate and undermining deeper from 6:00-10;00.  Continued packing undermining and silverhydrofiber with ABD pad for drainiage.  No reports/moans of discomfort through session.  Wound Therapy - Functional Problem List  decreased mobility, dependent for pressure relief, poor nutritional intake    Factors Delaying/Impairing Wound Healing  Immobility    Hydrotherapy Plan  Debridement;Dressing change;Patient/family education;Pulsatile lavage with suction    Wound Therapy - Frequency  3X / week    Wound Therapy - Current Recommendations  PT    Wound Plan  Continue pulsed lavage to thoroughly cleanse wound with sharps debridement as necessary and correct dressing to promote healing environment. Continue with hydrogel in wound bed to improve packing removal and prevent damage to healthy tissue.  Measure on Wednesdays.      Dressing   saline/hydrogel soaked gauze packed into wound with silver hydrofiber, abd pad, and sacral patch covering.                 PT Short Term Goals - 04/13/17 1458      PT SHORT TERM GOAL #1   Title  Patient's family/caregiver will verbalize understanding of pressure sensitive areas and reports appropriate safe methods to prevent pressure sores from developing.     Time  2    Period  Weeks    Status  New     Target Date  05/25/17      PT SHORT TERM GOAL #2   Title  Patient's family/caregiver will verbalize understanding of pressure relief recommendations and report pressure relief schedule to improve skin integrity and decrease breakdown.    Time  2    Period  Weeks    Status  New      PT SHORT TERM GOAL #3   Title  Family/caregiver will demonstrate proper inflation and fit technique for wheelchair cushion to provide optimal pressure relief for patient's sacrum while seated in wheelchair.    Time  4    Period  Weeks    Status  New    Target Date  05/11/17        PT Long Term Goals - 04/13/17 1502      PT LONG TERM GOAL #1   Title  Wound will decreas volume by 50% and there will be no signs of infection surround wound/periwound to show improved tissue health.    Time  8    Period  Weeks    Status  New    Target Date  06/08/17              Patient will benefit from skilled therapeutic intervention in order to improve the following deficits and impairments:     Visit Diagnosis: Pressure injury of sacral region, stage 4 (HCC)  Pressure injury of sacral region, unstageable ([place])  Other abnormalities of gait and mobility     Problem List Patient Active Problem List   Diagnosis Date Noted  . Symptomatic sinus bradycardia 07/24/2013  . Pacemaker battery depletion 07/17/2013  . Preoperative clearance 02/15/2013  . Subdural hematoma ([place]) 02/13/2013  . Falls frequently 11/15/2012  . Long term (current) use of anticoagulants 04/21/2012  . Hx of adenomatous colonic polyps 02/19/2012  . Constipation 10/07/2011  . UNSPECIFIED HYPOTHYROIDISM 06/18/2009  . Essential hypertension 06/18/2009  . ATRIAL FIBRILLATION 06/18/2009  . ORTHOSTATIC HYPOTENSION 06/18/2009  . SYNCOPE AND COLLAPSE 06/18/2009  . Cardiac pacemaker in situ 06/18/2009   Ihor Austin, LPTA; [place]  Aldona Lento 05/06/2017, 11:16 AM  [facility] [address] [place], [place], [address] Phone: [phone]   Fax:  [phone]  Name: REVEL STELLMACH MRN: [id number] Date of  Birth: Feb 20, 1924

## 2017-05-09 ENCOUNTER — Encounter (HOSPITAL_COMMUNITY): Payer: Self-pay

## 2017-05-09 ENCOUNTER — Telehealth (HOSPITAL_COMMUNITY): Payer: Self-pay

## 2017-05-09 ENCOUNTER — Ambulatory Visit (HOSPITAL_COMMUNITY): Payer: Medicare Other

## 2017-05-09 ENCOUNTER — Other Ambulatory Visit: Payer: Self-pay

## 2017-05-09 DIAGNOSIS — L89154 Pressure ulcer of sacral region, stage 4: Secondary | ICD-10-CM | POA: Diagnosis not present

## 2017-05-09 DIAGNOSIS — L89150 Pressure ulcer of sacral region, unstageable: Secondary | ICD-10-CM | POA: Diagnosis not present

## 2017-05-09 DIAGNOSIS — R2689 Other abnormalities of gait and mobility: Secondary | ICD-10-CM | POA: Diagnosis not present

## 2017-05-09 NOTE — Telephone Encounter (Signed)
I called Ms. Trace Wirick the patient's daughter and left a message following up about the lab culture performed on 04/25/17 for his sacral wound to find out if antibiotics have been ordered yet to improve healing and prevent infection. I provided our office number for her to call back and asked that she let us know if his doctor ordered medication or if she has not heard from them. I will call back at another date/time if Ms. Shampine does not return our call later today.   [name], PT, DPT Physical Therapist with [facility]  05/09/2017 8:33 AM

## 2017-05-09 NOTE — Therapy (Signed)
[facility] [address] [place], [place], [address] Phone: [phone]   Fax:  [phone]  Wound Care Therapy/Progress Note  Patient Details  Name: Reginald [name] MRN: [id number] Date of Birth: October 14, 1924 Referring Provider: Neale Burly, MD   Encounter Date: 05/09/2017  Progress Note Reporting Period 04/13/17 to 05/09/17  See note below for Objective Data and Assessment of Progress/Goals.    PT End of Session - 05/09/17 1640    Visit Number  11    Number of Visits  25    Date for PT Re-Evaluation  06/08/17 Mini-reassess 05/11/17    Authorization Type  [employer]    Authorization Time Period  04/13/17 - 06/08/17    Authorization - Visit Number  1    Authorization - Number of Visits  10    PT Start Time  1350    PT Stop Time  1426    PT Time Calculation (min)  36 min    Activity Tolerance  Patient tolerated treatment well    Behavior During Therapy  WFL for tasks assessed/performed       Past Medical History:  Diagnosis Date  . Anxiety neurosis   . Arrhythmia    afib  . Atrial fib/flutter, transient   . BPH (benign prostatic hyperplasia)   . Coronary artery disease    MINOR  . Hyperlipidemia   . OBS (organic brain syndrome)   . Orthostatic hypotension   . Syncope and collapse   . Vertigo     Past Surgical History:  Procedure Laterality Date  . APPENDECTOMY    . CARDIAC CATHETERIZATION  06/17/97   FALSE/POSITIVE TREADMILL EXERCISE TEST. MILD CAD WITH NL LV FUNCTION. MILD HYPERTENSION. FAMILY HX OF CAD, REMOTE SMOKER. PAST HX OF PAF.  Marland Kitchen CAROTID DUPLEX Bilateral 03/13/01   SM AMT OF NON HEMODYNAMICALLY SIGN PLAQUE IN THE RIGHT CAROTID BULB, <39% STENOSIS.  Marland Kitchen COLONOSCOPY  10/20/2011   Procedure: COLONOSCOPY;  Surgeon: Rogene Houston, MD;  Location: [facility];  Service: Endoscopy;  Laterality: N/A;  . CRANIOTOMY N/A 02/15/2013   Procedure: CRANIOTOMY HEMATOMA EVACUATION SUBDURAL;  Surgeon: Hosie Spangle, MD;  Location: [facility];  Service: Neurosurgery;  Laterality: N/A;  . DOPPLER ECHOCARDIOGRAPHY N/A 02/01/12   LV CAVITY SIZE IS NORMAL. MILD CONCENTRIC HYPERTROPHY. EF 55-60%. PARAMETERS ARE CONSISTANT WITH ABNORMAL LV RELAXATION.(GRADE 1 DIASTOLIC DYSFUNCTION). MR. LEFT ATRIUM MODERATELY DILATED. RIGHT ATRIUM MILDLY DILATED. ATRIAL SEPTUM NORMAL. ATRIAL PACED RHYTHM WITH INTACT A-V CONDUCTION.  Marland Kitchen enrhythm generator     implant of a medtronic generator  . interrogation     atrial and ventricular  . NUCLEAR STRESS TEST N/A 03/18/09   NORMAL PATTERN OF PERFUSION IN ALL REGIONS. LEFT VENTRICULAR SIZE IS NORMAL. NO EVID OF INDUCIBLE ISCHEMIA. EF 77%. NORMAL MYOCARDIAL PERFUSION STUDY.  Marland Kitchen PACEMAKER GENERATOR CHANGE N/A 07/24/2013   Procedure: PACEMAKER GENERATOR CHANGE;  Surgeon: Sanda Klein, MD;  Location: [facility];  Service: Cardiovascular;  Laterality: N/A;  . PULSE GENERATOR IMPLANT      There were no vitals filed for this visit.    Wound Therapy - 05/09/17 1749    Subjective  Pt arrived with dressing intact and some soiling from BM on bottom of gel adhesive. Patient smiling upon arrival and pleasant. Care-taker Gerald Stabs) reports daughter has still not commented on receiving call about antibiotics or purchased WC cushion. Patient is mostly non-verbal throughout however does groan several times during pulsed lavage and  selective debridement indicating discomfort.    Patient and Family Stated Goals  Wound to heal and patient to not be in pain    Prior Treatments  home RN for 2 weeks, ended last week and recommended wound care referral    Pain Scale  Faces    Faces Pain Scale  Hurts little more 0 upon arrival - moments of increaed during treatment    Pain Type  Acute pain    Pain Location  Sacrum    Pain Orientation  Posterior;Mid    Pain Descriptors / Indicators  Grimacing pt growning but did not verbalize details about pain    Patients Stated Pain Goal  0    Pain  Intervention(s)  Heat applied;Distraction;Emotional support;Therapeutic touch    Evaluation and Treatment Procedures Explained to Patient/Family  Yes    Evaluation and Treatment Procedures  Patient unable to consent due to mental status caregiver consent to treatment    Pressure Injury Properties Date First Assessed: 04/13/17 Time First Assessed: 8177 Location: Sacrum Location Orientation: Posterior;Mid Staging: Stage IV - Full thickness tissue loss with exposed bone, tendon or muscle. Wound Description (Comments): large open wound with yellow/greenish slough present in openign and on wound bed and adheared to wound margins Present on Admission: Yes   Dressing Type  Silver hydrofiber;Gauze (Comment);Adhesive strips;Hydrogel hydrogel gauze, silver hydrofiber, sacral foam patch    Dressing  Changed;Old drainage (marked)    Dressing Change Frequency  PRN    State of Healing  Early/partial granulation    Site / Wound Assessment  Granulation tissue;Yellow;Red    % Wound base Red or Granulating  90%    % Wound base Yellow/Fibrinous Exudate  10%    % Wound base Black/Eschar  0%    Peri-wound Assessment  Pink    Wound Length (cm)  -- 3.1 on 05/04/17    Wound Width (cm)  -- 2.5 on 05/04/17    Wound Depth (cm)  -- 1 on 05/04/17    Undermining (cm)  Undermining present but greatly improved along Rt borader and improving alon Lt boarder of wound.    Margins  Epibole (rolled edges)    Drainage Amount  Copious    Drainage Description  Serosanguineous;Odor    Treatment  Cleansed;Debridement (Selective);Hydrotherapy (Pulse lavage);Packing (Saline gauze)    Pressure Injury Properties Date First Assessed: 04/13/17 Time First Assessed: 1124 Location: Sacrum Location Orientation: Right;Distal;Posterior Staging: Unstageable - Full thickness tissue loss in which the base of the ulcer is covered by slough (yellow, tan, gray, green or brown) and/or eschar (tan, brown or black) in the wound bed. Wound Description (Comments):  located right and distally to central sacral wound Present on Admission: Yes   Pressure Injury Properties Date First Assessed: 04/13/17 Time First Assessed: 1124 Location: Sacrum Location Orientation: Left;Mid Staging: Deep Tissue Injury - Purple or maroon localized area of discolored intact skin or blood-filled blister due to damage of underlying soft tissue from pressure and/or shear. Wound Description (Comments): connected to and located on left/slightly supirior board of central sacral wound, located between 8 and 12 o'clock Present on Admission: Yes   Pulsed lavage therapy - wound location  Central sacral wound    Pulsed Lavage with Suction (psi)  4 psi    Pulsed Lavage with Suction - Normal Saline Used  1000 mL    Pulsed Lavage Tip  Tip with splash shield    Selective Debridement - Location  sacral wound    Selective Debridement - Tools Used  Forceps;Scalpel    Selective Debridement - Tissue Removed  purulent slough and necrotic tissue in wound bed and around margins of wound    Wound Therapy - Clinical Statement  Continued with PLS and sharp debridement for removal of slough and dead skin perimeter of wound to promote healing. Patient with greatly improving wound granulation and decreased odor this session. Caregiver continues to report no information regarding antibiotics and I asked that he discuss antibiotic referral from MD with patient's daughter to report to let us know if they called her with results. Pt continues to have purulent exudate and while undermining has greatly decreased it remains substantial from 6:00-10:00.  Continued packing undermining with hydrogel and gauze, silverhydrofiber dressing and sacral foam patch.  Patient moaned a couple of times throughout session and emotional support, warm towel, blanket, and therapeutic touch provided to comfort patient.      Wound Therapy - Functional Problem List  decreased mobility, dependent for pressure relief, poor nutritional intake     Factors Delaying/Impairing Wound Healing  Immobility    Hydrotherapy Plan  Debridement;Dressing change;Patient/family education;Pulsatile lavage with suction    Wound Therapy - Frequency  3X / week    Wound Therapy - Current Recommendations  PT    Wound Plan  Continue pulsed lavage to thoroughly cleanse wound with sharps debridement as necessary and correct dressing to promote healing environment. Continue with hydrogel in wound bed to improve packing removal and prevent damage to healthy tissue.  Measure on Wednesdays.    Dressing   saline/hydrogel soaked gauze packed into wound with silver hydrofiber, and sacral patch covering.         PT Short Term Goals - 05/09/17 1645      PT SHORT TERM GOAL #1   Title  Patient's family/caregiver will verbalize understanding of pressure sensitive areas and reports appropriate safe methods to prevent pressure sores from developing.     Baseline  05/09/17 - caregiver chris verbalizes pressure sensitive areas    Time  2    Period  Weeks    Status  Partially Met      PT SHORT TERM GOAL #2   Title  Patient's family/caregiver will verbalize understanding of pressure relief recommendations and report pressure relief schedule to improve skin integrity and decrease breakdown.    Baseline  05/09/17 - caregiver chris verbalizes pressure relief timing    Time  2    Period  Weeks    Status  Partially Met      PT SHORT TERM GOAL #3   Title  Family/caregiver will demonstrate proper inflation and fit technique for wheelchair cushion to provide optimal pressure relief for patient's sacrum while seated in wheelchair.    Baseline  05/09/17 - no report of attmept to purchase at this time    Time  4    Period  Weeks    Status  On-going        PT Long Term Goals - 05/09/17 1644      PT LONG TERM GOAL #1   Title  Wound will decrease volume by 50% and there will be no signs of infection surround wound/periwound to show improved tissue health.    Time  8    Period   Weeks    Status  On-going         Plan - 05/09/17 1641    Clinical Impression Statement  see above    Rehab Potential  Poor    Clinical Impairments Affecting Rehab Potential  decreased mobility.    PT Frequency  3x / week    PT Duration  8 weeks    PT Treatment/Interventions  ADLs/Self Care Home Management;Patient/family education;Manual techniques;Therapeutic activities;Passive range of motion;Other (comment);Taping selective debridement, pulsed lavage, cleansing and dressing wound    PT Next Visit Plan  F/U with culture findings (call daughter back if she does not return message left by therapist regarding antibiotics and ROJO purchase for chair).  Continue pulsed lavage to thoroughly cleanse wound with sharps debridement as necessary and correct dressing to promote healing environment. Continue with hydrogel in wound bed to improve packing removal and prevent damage to healthy tissue.  Measure on Wednesdays.      Consulted and Agree with Plan of Care  Patient;Family member/caregiver    Family Member Consulted  caregiver, Gerald Stabs       Patient will benefit from skilled therapeutic intervention in order to improve the following deficits and impairments:  Decreased skin integrity, Improper body mechanics, Pain, Decreased mobility  Visit Diagnosis: Pressure injury of sacral region, stage 4 ([facility])  Pressure injury of sacral region, unstageable ([facility])  Other abnormalities of gait and mobility     Problem List Patient Active Problem List   Diagnosis Date Noted  . Symptomatic sinus bradycardia 07/24/2013  . Pacemaker battery depletion 07/17/2013  . Preoperative clearance 02/15/2013  . Subdural hematoma ([facility]) 02/13/2013  . Falls frequently 11/15/2012  . Long term (current) use of anticoagulants 04/21/2012  . Hx of adenomatous colonic polyps 02/19/2012  . Constipation 10/07/2011  . UNSPECIFIED HYPOTHYROIDISM 06/18/2009  . Essential hypertension 06/18/2009  . ATRIAL FIBRILLATION  06/18/2009  . ORTHOSTATIC HYPOTENSION 06/18/2009  . SYNCOPE AND COLLAPSE 06/18/2009  . Cardiac pacemaker in situ 06/18/2009    [name], PT, DPT Physical Therapist with [facility]  05/09/2017 6:04 PM    [facility] [address] [place], [place], [address] Phone: [phone]   Fax:  [phone]  Name: JOHNTAE BROXTERMAN MRN: [id number] Date of Birth: July 11, 1924

## 2017-05-11 ENCOUNTER — Encounter (HOSPITAL_COMMUNITY): Payer: Self-pay

## 2017-05-11 ENCOUNTER — Other Ambulatory Visit: Payer: Self-pay

## 2017-05-11 ENCOUNTER — Ambulatory Visit (HOSPITAL_COMMUNITY): Payer: Medicare Other

## 2017-05-11 DIAGNOSIS — L89154 Pressure ulcer of sacral region, stage 4: Secondary | ICD-10-CM

## 2017-05-11 DIAGNOSIS — L89150 Pressure ulcer of sacral region, unstageable: Secondary | ICD-10-CM

## 2017-05-11 DIAGNOSIS — R2689 Other abnormalities of gait and mobility: Secondary | ICD-10-CM | POA: Diagnosis not present

## 2017-05-11 NOTE — Therapy (Signed)
[place], [place], [address] Phone: [phone]   Fax:  [phone]  Wound Care Therapy  Patient Details  Name: Reginald [name] MRN: [id number] Date of Birth: 1924-04-04 Referring Provider: Neale Burly, MD   Encounter Date: 05/11/2017  PT End of Session - 05/11/17 1457    Visit Number  12    Number of Visits  25    Date for PT Re-Evaluation  06/08/17 Mini-reassess 05/11/17    Authorization Type  [employer]    Authorization Time Period  04/13/17 - 06/08/17    Authorization - Visit Number  2    Authorization - Number of Visits  10    PT Start Time  1302    PT Stop Time  1345    PT Time Calculation (min)  43 min    Activity Tolerance  Patient tolerated treatment well    Behavior During Therapy  [facility] for tasks assessed/performed       Past Medical History:  Diagnosis Date  . Anxiety neurosis   . Arrhythmia    afib  . Atrial fib/flutter, transient   . BPH (benign prostatic hyperplasia)   . Coronary artery disease    MINOR  . Hyperlipidemia   . OBS (organic brain syndrome)   . Orthostatic hypotension   . Syncope and collapse   . Vertigo     Past Surgical History:  Procedure Laterality Date  . APPENDECTOMY    . CARDIAC CATHETERIZATION  06/17/97   FALSE/POSITIVE TREADMILL EXERCISE TEST. MILD CAD WITH NL LV FUNCTION. MILD HYPERTENSION. FAMILY HX OF CAD, REMOTE SMOKER. PAST HX OF PAF.  Marland Kitchen CAROTID DUPLEX Bilateral 03/13/01   SM AMT OF NON HEMODYNAMICALLY SIGN PLAQUE IN THE RIGHT CAROTID BULB, <39% STENOSIS.  Marland Kitchen COLONOSCOPY  10/20/2011   Procedure: COLONOSCOPY;  Surgeon: Rogene Houston, MD;  [facility];  Service: Endoscopy;  Laterality: N/A;  . CRANIOTOMY N/A 02/15/2013   Procedure: CRANIOTOMY HEMATOMA EVACUATION SUBDURAL;  Surgeon: Hosie Spangle, MD;  Location: [place] NEURO ORS;  Service: Neurosurgery;  Laterality: N/A;  . DOPPLER ECHOCARDIOGRAPHY N/A 02/01/12   LV CAVITY SIZE IS  NORMAL. MILD CONCENTRIC HYPERTROPHY. EF 55-60%. PARAMETERS ARE CONSISTANT WITH ABNORMAL LV RELAXATION.(GRADE 1 DIASTOLIC DYSFUNCTION). MR. LEFT ATRIUM MODERATELY DILATED. RIGHT ATRIUM MILDLY DILATED. ATRIAL SEPTUM NORMAL. ATRIAL PACED RHYTHM WITH INTACT A-V CONDUCTION.  Marland Kitchen enrhythm generator     implant of a medtronic generator  . interrogation     atrial and ventricular  . NUCLEAR STRESS TEST N/A 03/18/09   NORMAL PATTERN OF PERFUSION IN ALL REGIONS. LEFT VENTRICULAR SIZE IS NORMAL. NO EVID OF INDUCIBLE ISCHEMIA. EF 77%. NORMAL MYOCARDIAL PERFUSION STUDY.  Marland Kitchen PACEMAKER GENERATOR CHANGE N/A 07/24/2013   Procedure: PACEMAKER GENERATOR CHANGE;  Surgeon: Sanda Klein, MD;  Location: [facility] CATH LAB;  Service: Cardiovascular;  Laterality: N/A;  . PULSE GENERATOR IMPLANT      There were no vitals filed for this visit.    Wound Therapy - 05/11/17 1441    Subjective  Pt arrived with dressing intact and some soiling from BM on bottom of gel adhesive. Patient is smiling and pleasant. Care-taker Gerald Stabs) reports daughter did receive a phone call from the physician but has not returned the call yet. Patient is more verbal this session and states that the nurses at the home dragged him around the bed and that's how he got his pressure sore. He also makes some groan several times during  selective debridement indicating discomfort. At the end of the session he states I had some pain but it's ok now.    Patient and Family Stated Goals  Wound to heal and patient to not be in pain    Prior Treatments  home RN for 2 weeks, ended last week and recommended wound care referral    Pain Scale  Faces    Faces Pain Scale  Hurts a little bit patient groans several times    Pain Type  Acute pain    Pain Location  Sacrum    Pain Orientation  Posterior;Mid    Pain Descriptors / Indicators  Moaning    Patients Stated Pain Goal  0    Pain Intervention(s)  Heat applied;Distraction;Emotional support;Therapeutic touch blanket     Evaluation and Treatment Procedures Explained to Patient/Family  Yes    Evaluation and Treatment Procedures  Patient unable to consent due to mental status family and caregiver consent to treatment    Pressure Injury Properties Date First Assessed: 04/13/17 Time First Assessed: 1856 Location: Sacrum Location Orientation: Posterior;Mid Staging: Stage IV - Full thickness tissue loss with exposed bone, tendon or muscle. Wound Description (Comments): large open wound with yellow/greenish slough present in openign and on wound bed and adheared to wound margins Present on Admission: Yes   Dressing Type  Silver hydrofiber;Hydrogel;Gauze (Comment);Foam gauze packing, foam sacral patch    Dressing  Changed;Old drainage (marked)    Dressing Change Frequency  PRN    State of Healing  Early/partial granulation    Site / Wound Assessment  Granulation tissue;Yellow;Red    % Wound base Red or Granulating  90%    % Wound base Yellow/Fibrinous Exudate  10%    Peri-wound Assessment  Pink gray    Wound Length (cm)  2.5 cm was 3.1    Wound Width (cm)  2.6 cm was 2.5    Wound Depth (cm)  0.8 cm was 1    Wound Surface Area (cm^2)  6.5 cm^2    Wound Volume (cm^3)  5.2 cm^3    Undermining (cm)  Undermining is 0.2 cm in depth extending from 12:00 to 1:00; 0.1 cm in depth extending from 1:00 to 2:00; 0.2 cm in depth extending from 2:00 to 3:00; 0.1 cm in depth extending from 3:00 to 6:00; 0.5 cm in depth extending from 6:00 to 7:00; 0.9 cm in depth extending from 7:00 to 8:00; 1.0 cm in depth extending from 8:00 to 9:00; 1.5 cm in depth extending from 9:00 to 10:00; and 1.7 cm in depth extending from 10:00 to 12:00.    Margins  Epibole (rolled edges)    Drainage Amount  Copious    Drainage Description  Serosanguineous    Treatment  Cleansed;Debridement (Selective);Hydrotherapy (Pulse lavage);Packing (Saline gauze)    Pressure Injury Properties Date First Assessed: 04/13/17 Time First Assessed: 1124 Location: Sacrum  Location Orientation: Right;Distal;Posterior Staging: Unstageable - Full thickness tissue loss in which the base of the ulcer is covered by slough (yellow, tan, gray, green or brown) and/or eschar (tan, brown or black) in the wound bed. Wound Description (Comments): located right and distally to central sacral wound Present on Admission: Yes   Pressure Injury Properties Date First Assessed: 04/13/17 Time First Assessed: 1124 Location: Sacrum Location Orientation: Left;Mid Staging: Deep Tissue Injury - Purple or maroon localized area of discolored intact skin or blood-filled blister due to damage of underlying soft tissue from pressure and/or shear. Wound Description (Comments): connected to and located on left/slightly  supirior board of central sacral wound, located between 8 and 12 o'clock Present on Admission: Yes   Pulsed lavage therapy - wound location  Central sacral wound    Pulsed Lavage with Suction (psi)  4 psi    Pulsed Lavage with Suction - Normal Saline Used  1000 mL    Pulsed Lavage Tip  Tip with splash shield    Selective Debridement - Location  sacral wound    Selective Debridement - Tools Used  Forceps;Scalpel    Selective Debridement - Tissue Removed  biofilm and slough, wound edges to promote healing    Wound Therapy - Clinical Statement  Continued with PLS and sharp debridement for removal of slough and dead skin perimeter of wound to promote healing. Patient with greatly improving wound granulation and decreased odor this session. Measurements reveal decreased undermining and wound surface area decrease form 7.75 cm^2 to 6.5 cm^2, and volume decrease from 7.75 cm^3 to 5.2 cm ^3. Caregiver reports patient's daughter has received a call and message form MD but has not called them back. Pt continues to have purulent exudate and while undermining has greatly decreased it remains substantial from 6:00-12:00.  Continued packing undermining with hydrogel and gauze, silverhydrofiber dressing and  sacral foam patch.      Wound Therapy - Functional Problem List  decreased mobility, dependent for pressure relief, poor nutritional intake    Factors Delaying/Impairing Wound Healing  Immobility    Hydrotherapy Plan  Debridement;Dressing change;Patient/family education;Pulsatile lavage with suction    Wound Therapy - Frequency  3X / week    Wound Therapy - Current Recommendations  PT    Wound Plan  Continue pulsed lavage to thoroughly cleanse wound with sharps debridement as necessary and correct dressing to promote healing environment. Continue with hydrogel in wound bed to improve packing removal and prevent damage to healthy tissue.  Measure on Wednesdays.    Dressing   saline/hydrogel soaked gauze packed into wound with silver hydrofiber, and sacral patch covering.         PT Short Term Goals - 05/09/17 1645      PT SHORT TERM GOAL #1   Title  Patient's family/caregiver will verbalize understanding of pressure sensitive areas and reports appropriate safe methods to prevent pressure sores from developing.     Baseline  05/09/17 - caregiver chris verbalizes pressure sensitive areas    Time  2    Period  Weeks    Status  Partially Met      PT SHORT TERM GOAL #2   Title  Patient's family/caregiver will verbalize understanding of pressure relief recommendations and report pressure relief schedule to improve skin integrity and decrease breakdown.    Baseline  05/09/17 - caregiver chris verbalizes pressure relief timing    Time  2    Period  Weeks    Status  Partially Met      PT SHORT TERM GOAL #3   Title  Family/caregiver will demonstrate proper inflation and fit technique for wheelchair cushion to provide optimal pressure relief for patient's sacrum while seated in wheelchair.    Baseline  05/09/17 - no report of attmept to purchase at this time    Time  4    Period  Weeks    Status  On-going        PT Long Term Goals - 05/09/17 1644      PT LONG TERM GOAL #1   Title  Wound will  decrease volume by 50% and there will  be no signs of infection surround wound/periwound to show improved tissue health.    Time  8    Period  Weeks    Status  On-going        Plan - 05/11/17 1458    Clinical Impression Statement  see above    Rehab Potential  Poor    Clinical Impairments Affecting Rehab Potential  decreased mobility.    PT Frequency  3x / week    PT Duration  8 weeks    PT Treatment/Interventions  ADLs/Self Care Home Management;Patient/family education;Manual techniques;Therapeutic activities;Passive range of motion;Other (comment);Taping selective debridement, pulsed lavage, cleansing and dressing wound    PT Next Visit Plan  F/U with culture findings (continue to call daughter back if she does not return message left by therapist regarding antibiotics and ROJO purchase for chair).  Continue pulsed lavage to thoroughly cleanse wound with sharps debridement as necessary and correct dressing to promote healing environment. Continue with hydrogel in wound bed to improve packing removal and prevent damage to healthy tissue.  Measure on Wednesdays.      Consulted and Agree with Plan of Care  Patient;Family member/caregiver    Family Member Consulted  caregiver, Gerald Stabs       Patient will benefit from skilled therapeutic intervention in order to improve the following deficits and impairments:  Decreased skin integrity, Improper body mechanics, Pain, Decreased mobility  Visit Diagnosis: Pressure injury of sacral region, stage 4 (HCC)  Pressure injury of sacral region, unstageable ([place])  Other abnormalities of gait and mobility     Problem List Patient Active Problem List   Diagnosis Date Noted  . Symptomatic sinus bradycardia 07/24/2013  . Pacemaker battery depletion 07/17/2013  . Preoperative clearance 02/15/2013  . Subdural hematoma ([place]) 02/13/2013  . Falls frequently 11/15/2012  . Long term (current) use of anticoagulants 04/21/2012  . Hx of adenomatous colonic  polyps 02/19/2012  . Constipation 10/07/2011  . UNSPECIFIED HYPOTHYROIDISM 06/18/2009  . Essential hypertension 06/18/2009  . ATRIAL FIBRILLATION 06/18/2009  . ORTHOSTATIC HYPOTENSION 06/18/2009  . SYNCOPE AND COLLAPSE 06/18/2009  . Cardiac pacemaker in situ 06/18/2009    [name], PT, DPT Physical Therapist with [facility]  05/11/2017 2:59 PM    [facility] [address] [place], [place], [address] Phone: [phone]   Fax:  [phone]  Name: GURNOOR URSUA MRN: [id number] Date of Birth: 1924/03/23

## 2017-05-13 ENCOUNTER — Ambulatory Visit (HOSPITAL_COMMUNITY): Payer: Medicare Other | Admitting: Physical Therapy

## 2017-05-13 ENCOUNTER — Encounter (HOSPITAL_COMMUNITY): Payer: Self-pay | Admitting: Physical Therapy

## 2017-05-13 ENCOUNTER — Encounter

## 2017-05-13 DIAGNOSIS — L89154 Pressure ulcer of sacral region, stage 4: Secondary | ICD-10-CM | POA: Diagnosis not present

## 2017-05-13 DIAGNOSIS — L89150 Pressure ulcer of sacral region, unstageable: Secondary | ICD-10-CM

## 2017-05-13 DIAGNOSIS — R2689 Other abnormalities of gait and mobility: Secondary | ICD-10-CM | POA: Diagnosis not present

## 2017-05-13 NOTE — Therapy (Signed)
[place], [place], [address] Phone: [phone]   Fax:  [phone]  Wound Care Therapy  Patient Details  Name: Reginald [name] MRN: [id number] Date of Birth: [birth year] Referring Provider: Neale Burly, MD   Encounter Date: 05/13/2017  PT End of Session - 05/13/17 1042    Visit Number  13    Number of Visits  25    Date for PT Re-Evaluation  06/08/17 Mini-reassess 05/11/17    Authorization Type  [employer]    Authorization Time Period  04/13/17 - 06/08/17    Authorization - Visit Number  2    Authorization - Number of Visits  10    PT Start Time  0945    PT Stop Time  1020    PT Time Calculation (min)  35 min    Activity Tolerance  Patient tolerated treatment well    Behavior During Therapy  [facility] for tasks assessed/performed       Past Medical History:  Diagnosis Date  . Anxiety neurosis   . Arrhythmia    afib  . Atrial fib/flutter, transient   . BPH (benign prostatic hyperplasia)   . Coronary artery disease    MINOR  . Hyperlipidemia   . OBS (organic brain syndrome)   . Orthostatic hypotension   . Syncope and collapse   . Vertigo     Past Surgical History:  Procedure Laterality Date  . APPENDECTOMY    . CARDIAC CATHETERIZATION  06/17/97   FALSE/POSITIVE TREADMILL EXERCISE TEST. MILD CAD WITH NL LV FUNCTION. MILD HYPERTENSION. FAMILY HX OF CAD, REMOTE SMOKER. PAST HX OF PAF.  Marland Kitchen CAROTID DUPLEX Bilateral 03/13/01   SM AMT OF NON HEMODYNAMICALLY SIGN PLAQUE IN THE RIGHT CAROTID BULB, <39% STENOSIS.  Marland Kitchen COLONOSCOPY  10/20/2011   Procedure: COLONOSCOPY;  Surgeon: Rogene Houston, MD;  [facility];  Service: Endoscopy;  Laterality: N/A;  . CRANIOTOMY N/A 02/15/2013   Procedure: CRANIOTOMY HEMATOMA EVACUATION SUBDURAL;  Surgeon: Hosie Spangle, MD;  Location: [facility] NEURO ORS;  Service: Neurosurgery;  Laterality: N/A;  . DOPPLER ECHOCARDIOGRAPHY N/A 02/01/12   LV CAVITY SIZE IS  NORMAL. MILD CONCENTRIC HYPERTROPHY. EF 55-60%. PARAMETERS ARE CONSISTANT WITH ABNORMAL LV RELAXATION.(GRADE 1 DIASTOLIC DYSFUNCTION). MR. LEFT ATRIUM MODERATELY DILATED. RIGHT ATRIUM MILDLY DILATED. ATRIAL SEPTUM NORMAL. ATRIAL PACED RHYTHM WITH INTACT A-V CONDUCTION.  Marland Kitchen enrhythm generator     implant of a medtronic generator  . interrogation     atrial and ventricular  . NUCLEAR STRESS TEST N/A 03/18/09   NORMAL PATTERN OF PERFUSION IN ALL REGIONS. LEFT VENTRICULAR SIZE IS NORMAL. NO EVID OF INDUCIBLE ISCHEMIA. EF 77%. NORMAL MYOCARDIAL PERFUSION STUDY.  Marland Kitchen PACEMAKER GENERATOR CHANGE N/A 07/24/2013   Procedure: PACEMAKER GENERATOR CHANGE;  Surgeon: Sanda Klein, MD;  Location: [facility] CATH LAB;  Service: Cardiovascular;  Laterality: N/A;  . PULSE GENERATOR IMPLANT      There were no vitals filed for this visit.              Wound Therapy - 05/13/17 1034    Subjective  PT is nonverbal.      Patient and Family Stated Goals  Wound to heal and patient to not be in pain    Prior Treatments  home RN for 2 weeks, ended last week and recommended wound care referral    Pain Scale  Faces    Faces Pain Scale  No hurt    Evaluation and  Treatment Procedures Explained to Patient/Family  Yes    Evaluation and Treatment Procedures  Patient unable to consent due to mental status family and caregiver consent to treatment    Pressure Injury Properties Date First Assessed: 04/13/17 Time First Assessed: 9024 Location: Sacrum Location Orientation: Posterior;Mid Staging: Stage IV - Full thickness tissue loss with exposed bone, tendon or muscle. Wound Description (Comments): large open wound with yellow/greenish slough present in openign and on wound bed and adheared to wound margins Present on Admission: Yes   Dressing Type  Foam;Gauze (Comment);Hydrogel;Moist to moist gauze packing, foam sacral patch    Dressing  Changed;Old drainage (marked)    Dressing Change Frequency  PRN    State of Healing   Early/partial granulation    Site / Wound Assessment  Granulation tissue;Yellow;Red    % Wound base Red or Granulating  95%    % Wound base Yellow/Fibrinous Exudate  5%    % Wound base Black/Eschar  0%    Peri-wound Assessment  Pink gray    Undermining (cm)  all directions    Margins  Epibole (rolled edges)    Drainage Amount  Moderate    Drainage Description  Serous    Treatment  Debridement (Selective);Hydrotherapy (Pulse lavage)                                                                                                        Pulsed lavage therapy - wound location  Central sacral wound    Pulsed Lavage with Suction (psi)  4 psi    Pulsed Lavage with Suction - Normal Saline Used  1000 mL    Pulsed Lavage Tip  Tip with splash shield    Selective Debridement - Location  sacral wound; epiboled edges     Selective Debridement - Tools Used  Forceps    Selective Debridement - Tissue Removed  biofilm and , wound edges to promote healing    Wound Therapy - Clinical Statement  PT wound has cleaned significantly.  Will counsel with evaluating therapist about decreasing to twice a week and possible discontinue of pulse lavage.  Once wound is clean pt will be ready to be referred back to home health for dressing changes.     Wound Therapy - Functional Problem List  decreased mobility, dependent for pressure relief, poor nutritional intake    Factors Delaying/Impairing Wound Healing  Immobility    Hydrotherapy Plan  Debridement;Dressing change;Patient/family education;Pulsatile lavage with suction    Wound Therapy - Frequency  3X / week    Wound Therapy - Current Recommendations  PT    Wound Plan  Contiue with debridement of epiboled edges.      Dressing   saline/hydrogel soaked gauze packed into wound with  sacral patch covering.     Decrease Length/Width/Depth by (cm)  Wound volume/surface area will decrease by 50%  or greater    Improve Drainage Characteristics  Min no  signs of local infection with no purulent drainage an no     Patient/Family will be able to   Family/caregiver will demonstrate  proper inflation and fit technique for wheelchair cushion to provide optimal pressure relief for patient's sacrum while seated in wheelchair.    Additional Wound Therapy Goal  Patient's family/caregiver will verbalize understanding of pressure sensitive areas and reports appropriate safe methods to prevent pressure sores from developing and verbalize understanding of pressure relief recommendations and report pressure relief schedule to improve skin integrity and decrease breakdown.    Goals/treatment plan/discharge plan were made with and agreed upon by patient/family  Yes    Time For Goal Achievement  Other (comment) 2-8 weeks    Wound Therapy - Potential for Goals  Good                PT Short Term Goals - 05/09/17 1645      PT SHORT TERM GOAL #1   Title  Patient's family/caregiver will verbalize understanding of pressure sensitive areas and reports appropriate safe methods to prevent pressure sores from developing.     Baseline  05/09/17 - caregiver chris verbalizes pressure sensitive areas    Time  2    Period  Weeks    Status  Partially Met      PT SHORT TERM GOAL #2   Title  Patient's family/caregiver will verbalize understanding of pressure relief recommendations and report pressure relief schedule to improve skin integrity and decrease breakdown.    Baseline  05/09/17 - caregiver chris verbalizes pressure relief timing    Time  2    Period  Weeks    Status  Partially Met      PT SHORT TERM GOAL #3   Title  Family/caregiver will demonstrate proper inflation and fit technique for wheelchair cushion to provide optimal pressure relief for patient's sacrum while seated in wheelchair.    Baseline  05/09/17 - no report of attmept to purchase at this time    Time  4    Period  Weeks    Status  On-going        PT Long Term Goals - 05/09/17 1644       PT LONG TERM GOAL #1   Title  Wound will decrease volume by 50% and there will be no signs of infection surround wound/periwound to show improved tissue health.    Time  8    Period  Weeks    Status  On-going            Plan - 05/13/17 1043    Clinical Impression Statement  see above     Rehab Potential  Poor    Clinical Impairments Affecting Rehab Potential  decreased mobility.    PT Frequency  3x / week    PT Duration  8 weeks    PT Treatment/Interventions  ADLs/Self Care Home Management;Patient/family education;Manual techniques;Therapeutic activities;Passive range of motion;Other (comment);Taping selective debridement, pulsed lavage, cleansing and dressing wound    PT Next Visit Plan  see above     Consulted and Agree with Plan of Care  Patient;Family member/caregiver    Family Member Consulted  caregiver, Gerald Stabs       Patient will benefit from skilled therapeutic intervention in order to improve the following deficits and impairments:  Decreased skin integrity, Improper body mechanics, Pain, Decreased mobility  Visit Diagnosis: Pressure injury of sacral region, stage 4 (HCC)  Pressure injury of sacral region, unstageable [facility])     Problem List Patient Active Problem List   Diagnosis Date Noted  . Symptomatic sinus bradycardia 07/24/2013  . Pacemaker battery depletion 07/17/2013  . Preoperative  clearance 02/15/2013  . Subdural hematoma ([place]) 02/13/2013  . Falls frequently 11/15/2012  . Long term (current) use of anticoagulants 04/21/2012  . Hx of adenomatous colonic polyps 02/19/2012  . Constipation 10/07/2011  . UNSPECIFIED HYPOTHYROIDISM 06/18/2009  . Essential hypertension 06/18/2009  . ATRIAL FIBRILLATION 06/18/2009  . ORTHOSTATIC HYPOTENSION 06/18/2009  . SYNCOPE AND COLLAPSE 06/18/2009  . Cardiac pacemaker in situ 06/18/2009    Rayetta Humphrey, PT CLT [phone] 05/13/2017, 10:45 AM  [place] [address] [place], [place], [address] Phone: [phone]   Fax:  [phone]  Name: Reginald [name] MRN: [id number] Date of Birth: 1924-05-14

## 2017-05-16 ENCOUNTER — Encounter (HOSPITAL_COMMUNITY): Payer: Self-pay

## 2017-05-16 ENCOUNTER — Ambulatory Visit (HOSPITAL_COMMUNITY): Payer: Medicare Other

## 2017-05-16 ENCOUNTER — Telehealth (HOSPITAL_COMMUNITY): Payer: Self-pay | Admitting: Internal Medicine

## 2017-05-16 ENCOUNTER — Other Ambulatory Visit: Payer: Self-pay

## 2017-05-16 DIAGNOSIS — L89154 Pressure ulcer of sacral region, stage 4: Secondary | ICD-10-CM | POA: Diagnosis not present

## 2017-05-16 DIAGNOSIS — L89150 Pressure ulcer of sacral region, unstageable: Secondary | ICD-10-CM | POA: Diagnosis not present

## 2017-05-16 DIAGNOSIS — R2689 Other abnormalities of gait and mobility: Secondary | ICD-10-CM | POA: Diagnosis not present

## 2017-05-16 NOTE — Therapy (Signed)
[place], [place], [address] Phone: [phone]   Fax:  [phone]  Wound Care Therapy  Patient Details  Name: Reginald [name] MRN: [id number] Date of Birth: 1924-07-21 Referring Provider: Neale Burly, MD   Encounter Date: 05/16/2017  PT End of Session - 05/16/17 1407    Visit Number  14    Number of Visits  25    Date for PT Re-Evaluation  06/08/17 Mini-reassess 05/11/17    Authorization Type  [employer]    Authorization Time Period  04/13/17 - 06/08/17    Authorization - Visit Number  3    Authorization - Number of Visits  10    PT Start Time  1302    PT Stop Time  1340    PT Time Calculation (min)  38 min    Activity Tolerance  Patient tolerated treatment well    Behavior During Therapy  [facility] for tasks assessed/performed       Past Medical History:  Diagnosis Date  . Anxiety neurosis   . Arrhythmia    afib  . Atrial fib/flutter, transient   . BPH (benign prostatic hyperplasia)   . Coronary artery disease    MINOR  . Hyperlipidemia   . OBS (organic brain syndrome)   . Orthostatic hypotension   . Syncope and collapse   . Vertigo     Past Surgical History:  Procedure Laterality Date  . APPENDECTOMY    . CARDIAC CATHETERIZATION  06/17/97   FALSE/POSITIVE TREADMILL EXERCISE TEST. MILD CAD WITH NL LV FUNCTION. MILD HYPERTENSION. FAMILY HX OF CAD, REMOTE SMOKER. PAST HX OF PAF.  Marland Kitchen CAROTID DUPLEX Bilateral 03/13/01   SM AMT OF NON HEMODYNAMICALLY SIGN PLAQUE IN THE RIGHT CAROTID BULB, <39% STENOSIS.  Marland Kitchen COLONOSCOPY  10/20/2011   Procedure: COLONOSCOPY;  Surgeon: Rogene Houston, MD;  [facility];  Service: Endoscopy;  Laterality: N/A;  . CRANIOTOMY N/A 02/15/2013   Procedure: CRANIOTOMY HEMATOMA EVACUATION SUBDURAL;  Surgeon: Hosie Spangle, MD;  Location: [facility] ORS;  Service: Neurosurgery;  Laterality: N/A;  . DOPPLER ECHOCARDIOGRAPHY N/A 02/01/12   LV CAVITY SIZE IS  NORMAL. MILD CONCENTRIC HYPERTROPHY. EF 55-60%. PARAMETERS ARE CONSISTANT WITH ABNORMAL LV RELAXATION.(GRADE 1 DIASTOLIC DYSFUNCTION). MR. LEFT ATRIUM MODERATELY DILATED. RIGHT ATRIUM MILDLY DILATED. ATRIAL SEPTUM NORMAL. ATRIAL PACED RHYTHM WITH INTACT A-V CONDUCTION.  Marland Kitchen enrhythm generator     implant of a medtronic generator  . interrogation     atrial and ventricular  . NUCLEAR STRESS TEST N/A 03/18/09   NORMAL PATTERN OF PERFUSION IN ALL REGIONS. LEFT VENTRICULAR SIZE IS NORMAL. NO EVID OF INDUCIBLE ISCHEMIA. EF 77%. NORMAL MYOCARDIAL PERFUSION STUDY.  Marland Kitchen PACEMAKER GENERATOR CHANGE N/A 07/24/2013   Procedure: PACEMAKER GENERATOR CHANGE;  Surgeon: Sanda Klein, MD;  Location: [facility] CATH LAB;  Service: Cardiovascular;  Laterality: N/A;  . PULSE GENERATOR IMPLANT      There were no vitals filed for this visit.   Wound Therapy - 05/16/17 1348    Subjective  Pt arrived with dressing intact and is smiling upon arrival and pleasant. He is with his caregiver Gerald Stabs.    Patient and Family Stated Goals  Wound to heal and patient to not be in pain    Prior Treatments  home RN for 2 weeks, ended last week and recommended wound care referral    Pain Scale  Faces    Faces Pain Scale  No hurt  Evaluation and Treatment Procedures Explained to Patient/Family  Yes    Evaluation and Treatment Procedures  Patient unable to consent due to mental status family and caregiver consent to treatment    Pressure Injury Properties Date First Assessed: 04/13/17 Time First Assessed: 5400 Location: Sacrum Location Orientation: Posterior;Mid Staging: Stage IV - Full thickness tissue loss with exposed bone, tendon or muscle. Wound Description (Comments): large open wound with yellow/greenish slough present in openign and on wound bed and adheared to wound margins Present on Admission: Yes   Dressing Type  Foam;Gauze (Comment);Hydrogel;Moist to moist hydrogel gauze (1 strip) to pack wound; petrolleum jelly    Dressing   Changed;Old drainage (marked)    Dressing Change Frequency  PRN    State of Healing  Early/partial granulation    Site / Wound Assessment  Granulation tissue;Yellow;Red    % Wound base Red or Granulating  95%    % Wound base Yellow/Fibrinous Exudate  5%    % Wound base Black/Eschar  0%    Peri-wound Assessment  Pink    Undermining (cm)  all directions    Margins  Epibole (rolled edges)    Drainage Amount  Moderate    Drainage Description  Serosanguineous    Treatment  Debridement (Selective);Cleansed;Packing (Saline gauze)    Pressure Injury Properties Date First Assessed: 04/13/17 Time First Assessed: 1124 Location: Sacrum Location Orientation: Right;Distal;Posterior Staging: Unstageable - Full thickness tissue loss in which the base of the ulcer is covered by slough (yellow, tan, gray, green or brown) and/or eschar (tan, brown or black) in the wound bed. Wound Description (Comments): located right and distally to central sacral wound Present on Admission: Yes   Pressure Injury Properties Date First Assessed: 04/13/17 Time First Assessed: 1124 Location: Sacrum Location Orientation: Left;Mid Staging: Deep Tissue Injury - Purple or maroon localized area of discolored intact skin or blood-filled blister due to damage of underlying soft tissue from pressure and/or shear. Wound Description (Comments): connected to and located on left/slightly supirior board of central sacral wound, located between 8 and 12 o'clock Present on Admission: Yes   Selective Debridement - Location  sacral wound; epiboled edges     Selective Debridement - Tools Used  Scalpel;Forceps    Selective Debridement - Tissue Removed  biofilm and , wound edges to promote healing    Wound Therapy - Clinical Statement  Discontinued PLS secondary to major improvement in granulation tissue and no presence of adherent slough.  Continued with sharp debridement for removal of biofilm and epibolied wound edges to promote healing. Patient with  greatly improving wound granulation and but notable odor this session compared to last Wednesday and ongoing purulent exudate. Silver hydrofiber has been discontinued last 2 sessions to trial transition from antibacterial dressing. Continue with silver hydrofiber if odor persists next session. Continued packing undermining with hydrogel and gauze, and sacral foam patch. Patient has been dropped to 2x/week as wound is healing well at this time.    Wound Therapy - Functional Problem List  decreased mobility, dependent for pressure relief, poor nutritional intake    Factors Delaying/Impairing Wound Healing  Immobility    Hydrotherapy Plan  Debridement;Dressing change;Patient/family education    Wound Therapy - Frequency  2X / week    Wound Therapy - Current Recommendations  PT    Wound Plan  Continue to thoroughly cleanse wound with sharps debridement as necessary and correct dressing to promote healing environment. Continue with hydrogel in wound bed to improve packing removal and prevent damage  to healthy tissue.  Measure on Wednesdays/Thursdays.     Dressing   saline/hydrogel soaked gauze packed into wound with  sacral patch covering.     Decrease Length/Width/Depth by (cm)  Wound volume/surface area will decrease by 50%  or greater    Decrease Length/Width/Depth - Progress  Progressing toward goal    Improve Drainage Characteristics  Min no signs of local infection with no purulent drainage an no     Improve Drainage Characteristics - Progress  Progressing toward goal    Patient/Family will be able to   Family/caregiver will demonstrate proper inflation and fit technique for wheelchair cushion to provide optimal pressure relief for patient's sacrum while seated in wheelchair.    Patient/Family Instruction Goal - Progress  Progressing toward goal    Additional Wound Therapy Goal  Patient's family/caregiver will verbalize understanding of pressure sensitive areas and reports appropriate safe methods to  prevent pressure sores from developing and verbalize understanding of pressure relief recommendations and report pressure relief schedule to improve skin integrity and decrease breakdown.    Additional Wound Therapy Goal - Progress  Progressing toward goal    Goals/treatment plan/discharge plan were made with and agreed upon by patient/family  Yes    Time For Goal Achievement  Other (comment)  2-8 weeks    Wound Therapy - Potential for Goals  Good         PT Education - 05/16/17 1406    Education provided  Yes    Education Details  Discussed with patient and caregiver transitioning to 2x/week and if wound continues healing with no new signs of infection or increase in odor transitioning to home health nursing for wound care.    Person(s) Educated  Patient;Caregiver(s) chris    Methods  Explanation    Comprehension  Verbalized understanding       PT Short Term Goals - 05/09/17 1645      PT SHORT TERM GOAL #1   Title  Patient's family/caregiver will verbalize understanding of pressure sensitive areas and reports appropriate safe methods to prevent pressure sores from developing.     Baseline  05/09/17 - caregiver chris verbalizes pressure sensitive areas    Time  2    Period  Weeks    Status  Partially Met      PT SHORT TERM GOAL #2   Title  Patient's family/caregiver will verbalize understanding of pressure relief recommendations and report pressure relief schedule to improve skin integrity and decrease breakdown.    Baseline  05/09/17 - caregiver chris verbalizes pressure relief timing    Time  2    Period  Weeks    Status  Partially Met      PT SHORT TERM GOAL #3   Title  Family/caregiver will demonstrate proper inflation and fit technique for wheelchair cushion to provide optimal pressure relief for patient's sacrum while seated in wheelchair.    Baseline  05/09/17 - no report of attmept to purchase at this time    Time  4    Period  Weeks    Status  On-going        PT Long  Term Goals - 05/09/17 1644      PT LONG TERM GOAL #1   Title  Wound will decrease volume by 50% and there will be no signs of infection surround wound/periwound to show improved tissue health.    Time  8    Period  Weeks    Status  On-going  Plan - 05/16/17 1407    Clinical Impression Statement  see above    Rehab Potential  Poor    Clinical Impairments Affecting Rehab Potential  decreased mobility.    PT Frequency  3x / week    PT Duration  8 weeks    PT Treatment/Interventions  ADLs/Self Care Home Management;Patient/family education;Manual techniques;Therapeutic activities;Passive range of motion;Other (comment);Taping selective debridement, pulsed lavage, cleansing and dressing wound    PT Next Visit Plan  see above     Consulted and Agree with Plan of Care  Patient;Family member/caregiver    Family Member Consulted  caregiver, Gerald Stabs       Patient will benefit from skilled therapeutic intervention in order to improve the following deficits and impairments:  Decreased skin integrity, Improper body mechanics, Pain, Decreased mobility  Visit Diagnosis: Pressure injury of sacral region, stage 4 (HCC)  Pressure injury of sacral region, unstageable ([place])  Other abnormalities of gait and mobility     Problem List Patient Active Problem List   Diagnosis Date Noted  . Symptomatic sinus bradycardia 07/24/2013  . Pacemaker battery depletion 07/17/2013  . Preoperative clearance 02/15/2013  . Subdural hematoma ([place]) 02/13/2013  . Falls frequently 11/15/2012  . Long term (current) use of anticoagulants 04/21/2012  . Hx of adenomatous colonic polyps 02/19/2012  . Constipation 10/07/2011  . UNSPECIFIED HYPOTHYROIDISM 06/18/2009  . Essential hypertension 06/18/2009  . ATRIAL FIBRILLATION 06/18/2009  . ORTHOSTATIC HYPOTENSION 06/18/2009  . SYNCOPE AND COLLAPSE 06/18/2009  . Cardiac pacemaker in situ 06/18/2009    [name], PT, DPT Physical Therapist with  [facility]  05/16/2017 2:08 PM   [place] [address] [place], [place], [address] Phone: [phone]   Fax:  [phone]  Name: Reginald [name] MRN: [id number] Date of Birth: 02-12-1924

## 2017-05-16 NOTE — Telephone Encounter (Signed)
05/16/17  Reginald [name] said he was down to 2x week and to come Monday and Thursday and only for 45 mins.

## 2017-05-18 ENCOUNTER — Ambulatory Visit (HOSPITAL_COMMUNITY): Payer: Medicare Other

## 2017-05-19 ENCOUNTER — Ambulatory Visit (HOSPITAL_COMMUNITY): Payer: Medicare Other | Admitting: Physical Therapy

## 2017-05-19 ENCOUNTER — Encounter (HOSPITAL_COMMUNITY): Payer: Self-pay | Admitting: Physical Therapy

## 2017-05-19 DIAGNOSIS — R2689 Other abnormalities of gait and mobility: Secondary | ICD-10-CM

## 2017-05-19 DIAGNOSIS — L89154 Pressure ulcer of sacral region, stage 4: Secondary | ICD-10-CM | POA: Diagnosis not present

## 2017-05-19 DIAGNOSIS — L89150 Pressure ulcer of sacral region, unstageable: Secondary | ICD-10-CM

## 2017-05-19 NOTE — Therapy (Signed)
[place], [place], [address] Phone: [phone]   Fax:  [phone]  Wound Care Therapy  Patient Details  Name: Reginald [name] MRN: [id number] Date of Birth: 07/07/1924 Referring Provider: Neale Burly, MD   Encounter Date: 05/19/2017  PT End of Session - 05/19/17 1130    Visit Number  15    Number of Visits  25    Date for PT Re-Evaluation  06/08/17 Mini-reassess 05/11/17    Authorization Type  [employer]IC[employer]    Authorization Time Period  04/13/17 - 06/08/17    Authorization - Visit Number  3    Authorization - Number of Visits  10    Activity Tolerance  Patient tolerated treatment well    Behavior During Therapy  [facility] for tasks assessed/performed       Past Medical History:  Diagnosis Date  . Anxiety neurosis   . Arrhythmia    afib  . Atrial fib/flutter, transient   . BPH (benign prostatic hyperplasia)   . Coronary artery disease    MINOR  . Hyperlipidemia   . OBS (organic brain syndrome)   . Orthostatic hypotension   . Syncope and collapse   . Vertigo     Past Surgical History:  Procedure Laterality Date  . APPENDECTOMY    . CARDIAC CATHETERIZATION  06/17/97   FALSE/POSITIVE TREADMILL EXERCISE TEST. MILD CAD WITH NL LV FUNCTION. MILD HYPERTENSION. FAMILY HX OF CAD, REMOTE SMOKER. PAST HX OF PAF.  Marland Kitchen CAROTID DUPLEX Bilateral 03/13/01   SM AMT OF NON HEMODYNAMICALLY SIGN PLAQUE IN THE RIGHT CAROTID BULB, <39% STENOSIS.  Marland Kitchen COLONOSCOPY  10/20/2011   Procedure: COLONOSCOPY;  Surgeon: Rogene Houston, MD;  [facility];  Service: Endoscopy;  Laterality: N/A;  . CRANIOTOMY N/A 02/15/2013   Procedure: CRANIOTOMY HEMATOMA EVACUATION SUBDURAL;  Surgeon: Hosie Spangle, MD;  Location: [facility] NEURO ORS;  Service: Neurosurgery;  Laterality: N/A;  . DOPPLER ECHOCARDIOGRAPHY N/A 02/01/12   LV CAVITY SIZE IS NORMAL. MILD CONCENTRIC HYPERTROPHY. EF 55-60%. PARAMETERS ARE CONSISTANT WITH ABNORMAL  LV RELAXATION.(GRADE 1 DIASTOLIC DYSFUNCTION). MR. LEFT ATRIUM MODERATELY DILATED. RIGHT ATRIUM MILDLY DILATED. ATRIAL SEPTUM NORMAL. ATRIAL PACED RHYTHM WITH INTACT A-V CONDUCTION.  Marland Kitchen enrhythm generator     implant of a medtronic generator  . interrogation     atrial and ventricular  . NUCLEAR STRESS TEST N/A 03/18/09   NORMAL PATTERN OF PERFUSION IN ALL REGIONS. LEFT VENTRICULAR SIZE IS NORMAL. NO EVID OF INDUCIBLE ISCHEMIA. EF 77%. NORMAL MYOCARDIAL PERFUSION STUDY.  Marland Kitchen PACEMAKER GENERATOR CHANGE N/A 07/24/2013   Procedure: PACEMAKER GENERATOR CHANGE;  Surgeon: Sanda Klein, MD;  Location: [place] [facility] CATH LAB;  Service: Cardiovascular;  Laterality: N/A;  . PULSE GENERATOR IMPLANT      There were no vitals filed for this visit.              Wound Therapy - 05/19/17 1119    Subjective  PT is nonverbal.      Patient and Family Stated Goals  Wound to heal and patient to not be in pain    Prior Treatments  home RN for 2 weeks, ended last week and recommended wound care referral    Pain Scale  Faces PT smiling;    Faces Pain Scale  No hurt    Evaluation and Treatment Procedures Explained to Patient/Family  Yes    Evaluation and Treatment Procedures  Patient unable to consent due to mental status family  and caregiver consent to treatment    Pressure Injury Properties Date First Assessed: 04/13/17 Time First Assessed: 9371 Location: Sacrum Location Orientation: Posterior;Mid Staging: Stage IV - Full thickness tissue loss with exposed bone, tendon or muscle. Wound Description (Comments): large open wound with yellow/greenish slough present in openign and on wound bed and adheared to wound margins Present on Admission: Yes   Dressing Type  Foam;Gauze (Comment);Hydrogel;Moist to moist hydrogel gauze (1 strip) to pack wound; petrolleum jelly    Dressing  Changed;Old drainage (marked)    Dressing Change Frequency  PRN    State of Healing  Early/partial granulation    Site / Wound Assessment   Granulation tissue;Yellow;Red    % Wound base Red or Granulating  -- 97%    % Wound base Yellow/Fibrinous Exudate  -- 3%    % Wound base Black/Eschar  0%    Peri-wound Assessment  Pink    Undermining (cm)  all directions     Margins  Epibole (rolled edges)    Drainage Amount  Moderate    Drainage Description  Serous    Treatment  Cleansed;Debridement (Selective);Packing (Saline gauze)    Pressure Injury Properties Date First Assessed: 04/13/17 Time First Assessed: 1124 Location: Sacrum Location Orientation: Right;Distal;Posterior Staging: Unstageable - Full thickness tissue loss in which the base of the ulcer is covered by slough (yellow, tan, gray, green or brown) and/or eschar (tan, brown or black) in the wound bed. Wound Description (Comments): located right and distally to central sacral wound Present on Admission: Yes   Pressure Injury Properties Date First Assessed: 04/13/17 Time First Assessed: 1124 Location: Sacrum Location Orientation: Left;Mid Staging: Deep Tissue Injury - Purple or maroon localized area of discolored intact skin or blood-filled blister due to damage of underlying soft tissue from pressure and/or shear. Wound Description (Comments): connected to and located on left/slightly supirior board of central sacral wound, located between 8 and 12 o'clock Present on Admission: Yes   Selective Debridement - Location  sacral wound; epiboled edges     Selective Debridement - Tools Used  Forceps    Selective Debridement - Tissue Removed  biofilm and , wound edges to promote healing    Wound Therapy - Clinical Statement  Wound does not have any increased odor therefore silverhydrogel was not used.  Edges are firmly attached and may benefit from MD using silvernitrate stick to assist in opening edges.      Wound Therapy - Functional Problem List  decreased mobility, dependent for pressure relief, poor nutritional intake    Factors Delaying/Impairing Wound Healing  Immobility     Hydrotherapy Plan  Debridement;Dressing change;Patient/family education    Wound Therapy - Frequency  2X / week    Wound Therapy - Current Recommendations  PT    Wound Plan  Continue to thoroughly cleanse wound with sharps debridement as necessary and correct dressing to promote healing environment. Continue with hydrogel in wound bed to improve packing removal and prevent damage to healthy tissue.  Measure on Wednesdays/Thursdays.     Dressing   saline/hydrogel soaked gauze packed into wound with  sacral patch covering.     Decrease Length/Width/Depth by (cm)  Wound volume/surface area will decrease by 50%  or greater    Decrease Length/Width/Depth - Progress  Progressing toward goal    Improve Drainage Characteristics  Min no signs of local infection with no purulent drainage an no     Improve Drainage Characteristics - Progress  Progressing toward goal  Patient/Family will be able to   Family/caregiver will demonstrate proper inflation and fit technique for wheelchair cushion to provide optimal pressure relief for patient's sacrum while seated in wheelchair.    Patient/Family Instruction Goal - Progress  Progressing toward goal    Additional Wound Therapy Goal  Patient's family/caregiver will verbalize understanding of pressure sensitive areas and reports appropriate safe methods to prevent pressure sores from developing and verbalize understanding of pressure relief recommendations and report pressure relief schedule to improve skin integrity and decrease breakdown.    Additional Wound Therapy Goal - Progress  Progressing toward goal    Goals/treatment plan/discharge plan were made with and agreed upon by patient/family  --    Time For Goal Achievement  --  2-8 weeks    Wound Therapy - Potential for Goals  --                PT Short Term Goals - 05/09/17 1645      PT SHORT TERM GOAL #1   Title  Patient's family/caregiver will verbalize understanding of pressure sensitive areas  and reports appropriate safe methods to prevent pressure sores from developing.     Baseline  05/09/17 - caregiver chris verbalizes pressure sensitive areas    Time  2    Period  Weeks    Status  Partially Met      PT SHORT TERM GOAL #2   Title  Patient's family/caregiver will verbalize understanding of pressure relief recommendations and report pressure relief schedule to improve skin integrity and decrease breakdown.    Baseline  05/09/17 - caregiver chris verbalizes pressure relief timing    Time  2    Period  Weeks    Status  Partially Met      PT SHORT TERM GOAL #3   Title  Family/caregiver will demonstrate proper inflation and fit technique for wheelchair cushion to provide optimal pressure relief for patient's sacrum while seated in wheelchair.    Baseline  05/09/17 - no report of attmept to purchase at this time    Time  4    Period  Weeks    Status  On-going        PT Long Term Goals - 05/09/17 1644      PT LONG TERM GOAL #1   Title  Wound will decrease volume by 50% and there will be no signs of infection surround wound/periwound to show improved tissue health.    Time  8    Period  Weeks    Status  On-going            Plan - 05/19/17 1130    Clinical Impression Statement  PT has appointment with MD to possibly use silver nitrate stick on edges to decrease epiboled edges.  Will assess after MD visit     Rehab Potential  Poor    Clinical Impairments Affecting Rehab Potential  decreased mobility.    PT Frequency  3x / week    PT Duration  8 weeks    PT Treatment/Interventions  ADLs/Self Care Home Management;Patient/family education;Manual techniques;Therapeutic activities;Passive range of motion;Other (comment);Taping selective debridement, pulsed lavage, cleansing and dressing wound    PT Next Visit Plan  measure wound     Consulted and Agree with Plan of Care  Patient;Family member/caregiver    Family Member Consulted  caregiver, Gerald Stabs       Patient will benefit  from skilled therapeutic intervention in order to improve the following deficits and impairments:  Decreased  skin integrity, Improper body mechanics, Pain, Decreased mobility  Visit Diagnosis: Pressure injury of sacral region, stage 4 (HCC)  Pressure injury of sacral region, unstageable (HCC)  Other abnormalities of gait and mobility     Problem List Patient Active Problem List   Diagnosis Date Noted  . Symptomatic sinus bradycardia 07/24/2013  . Pacemaker battery depletion 07/17/2013  . Preoperative clearance 02/15/2013  . Subdural hematoma ([place]) 02/13/2013  . Falls frequently 11/15/2012  . Long term (current) use of anticoagulants 04/21/2012  . Hx of adenomatous colonic polyps 02/19/2012  . Constipation 10/07/2011  . UNSPECIFIED HYPOTHYROIDISM 06/18/2009  . Essential hypertension 06/18/2009  . ATRIAL FIBRILLATION 06/18/2009  . ORTHOSTATIC HYPOTENSION 06/18/2009  . SYNCOPE AND COLLAPSE 06/18/2009  . Cardiac pacemaker in situ 06/18/2009    Rayetta Humphrey, PT CLT [phone] 05/19/2017, 11:32 AM  [facility] [address] [place], [place], [address] Phone: [phone]   Fax:  [phone]  Name: Reginald [name] MRN: [id number] Date of Birth: 18-Jan-1924

## 2017-05-20 ENCOUNTER — Ambulatory Visit (HOSPITAL_COMMUNITY): Payer: Medicare Other

## 2017-05-23 ENCOUNTER — Other Ambulatory Visit: Payer: Self-pay

## 2017-05-23 ENCOUNTER — Encounter (HOSPITAL_COMMUNITY): Payer: Self-pay

## 2017-05-23 ENCOUNTER — Ambulatory Visit (HOSPITAL_COMMUNITY): Payer: Medicare Other

## 2017-05-23 DIAGNOSIS — R2689 Other abnormalities of gait and mobility: Secondary | ICD-10-CM | POA: Diagnosis not present

## 2017-05-23 DIAGNOSIS — L89154 Pressure ulcer of sacral region, stage 4: Secondary | ICD-10-CM

## 2017-05-23 DIAGNOSIS — L89150 Pressure ulcer of sacral region, unstageable: Secondary | ICD-10-CM

## 2017-05-23 NOTE — Patient Instructions (Signed)
What Are Bed Sores? Bed sores (also known as pressure ulcers or decubitus ulcers) are injuries to the skin and underlying tissues that are results of prolonged pressure on the skin, which leads to inadequate blood flow to the area. Without the blood that carries oxygen and nutrients, the cells in the affected tissues can't function normally, so they become damaged and eventually die. Bed sores in elderly patients can develop quickly, typically in places where the skin covers the bony prominences of the body. They can lead to serious complications and even death, so it's very important to recognize and treat them as soon as the first signs become noticeable.  Common Sites of Pressure Sores Areas of the body where the skin presses directly against bone are at risk for developing bed sores because this is where it's easiest to compromise blood flow. Virtually all bed sores in seniors form in the following places after prolonged periods of immobility. For bedridden people, common sites include: Hips, lower back, and tailbone  Shoulder blades  Back or sides of the head  Heels, ankles, and behind the knees For people who use a wheelchair, common sites include: Tailbone and buttocks  Shoulder blades and spine  Backs of arms and legs   What Causes Bed Sores in the Elderly? Three main factors contribute to the formation of bed sores: Pressure: Persistent pressure that compromises the blood flow to a localized area of the body is the main cause of bed sores. As noted, without oxygen and nutrients, our cells will die. Damage starts from the skin and moves deeper toward the bones.  Friction: Can lying on a towel cause bed sores in elderly patients? Yes, friction against rough towels, clothing, and bedding can make fragile skin more vulnerable to injury. Moisture makes things even worse.  Shear: This occurs when two surfaces move in the opposite direction. For instance, when you elevate the bed, you may  slide down, but your skin may stay in place. Shearing in places where very little tissue separates skin and bones can damage it.  What Are the Risk Factors for Bed Sores? Bed sores in older adults form more often than in other age groups because, in addition to frailty and thin skin, they tend to have more risk factors, which include the following: Immobility: Poor health, spinal cord injury, and other causes can lead to immobility. This, in turn, prevents people from easily changing position in order to alleviate pressure to the affected parts of the body.  Changes in sensory perception: Diabetes and other conditions that cause nerve damage can result in loss of sensation in parts of the body. Without the ability to sense discomfort and change position, bed sores in elderly people are more likely to occur.  Reduced blood flow: Certain medical conditions, such as atherosclerosis and other vascular diseases, affect blood flow. This is a predisposing factor that increases the risk of tissue damage.  Malnutrition and dehydration: Not eating enough nutritious foods and not drinking enough fluids can lead to frailty and dry skin, which makes the breakdown of tissues easier.   How to Prevent Bed Sores in the Elderly Preventing bed sores is easier than treating them, but this can be challenging as well. Encouraging mobility is the best way to prevent bed sores. But often this isn't possible. In those cases, seniors have to rely on the dedication of their caregivers. Bed sores prevention tips include: Marland Kitchen Moving the senior every two hours in the bed . Shifting their weight  every 15 minutes in the wheelchair . Regular skin inspections . Keeping the skin healthy and dry . Maintaining good nutrition and hydration . Quitting smoking . Treating chronic conditions

## 2017-05-23 NOTE — Therapy (Signed)
[facility] [place], [place], [address] Phone: [phone]   Fax:  [phone]  Wound Care Therapy  Patient Details  Name: Reginald [name] MRN: [id number] Date of Birth: [birth year] Referring Provider: Neale Burly, MD   Encounter Date: 05/23/2017  PT End of Session - 05/23/17 1551    Visit Number  16    Number of Visits  25    Date for PT Re-Evaluation  06/08/17 Mini-reassess 05/11/17    Authorization Type  [employer]    Authorization Time Period  04/13/17 - 06/08/17    Authorization - Visit Number  4    Authorization - Number of Visits  10    PT Start Time  1301    PT Stop Time  1332    PT Time Calculation (min)  31 min    Activity Tolerance  Patient tolerated treatment well    Behavior During Therapy  [facility] for tasks assessed/performed       Past Medical History:  Diagnosis Date  . Anxiety neurosis   . Arrhythmia    afib  . Atrial fib/flutter, transient   . BPH (benign prostatic hyperplasia)   . Coronary artery disease    MINOR  . Hyperlipidemia   . OBS (organic brain syndrome)   . Orthostatic hypotension   . Syncope and collapse   . Vertigo     Past Surgical History:  Procedure Laterality Date  . APPENDECTOMY    . CARDIAC CATHETERIZATION  06/17/97   FALSE/POSITIVE TREADMILL EXERCISE TEST. MILD CAD WITH NL LV FUNCTION. MILD HYPERTENSION. FAMILY HX OF CAD, REMOTE SMOKER. PAST HX OF PAF.  Marland Kitchen CAROTID DUPLEX Bilateral 03/13/01   SM AMT OF NON HEMODYNAMICALLY SIGN PLAQUE IN THE RIGHT CAROTID BULB, <39% STENOSIS.  Marland Kitchen COLONOSCOPY  10/20/2011   Procedure: COLONOSCOPY;  Surgeon: Rogene Houston, MD;  Location: [facility];  Service: Endoscopy;  Laterality: N/A;  . CRANIOTOMY N/A 02/15/2013   Procedure: CRANIOTOMY HEMATOMA EVACUATION SUBDURAL;  Surgeon: Hosie Spangle, MD;  Location: [facility];  Service: Neurosurgery;  Laterality: N/A;  . DOPPLER ECHOCARDIOGRAPHY N/A 02/01/12   LV CAVITY SIZE IS  NORMAL. MILD CONCENTRIC HYPERTROPHY. EF 55-60%. PARAMETERS ARE CONSISTANT WITH ABNORMAL LV RELAXATION.(GRADE 1 DIASTOLIC DYSFUNCTION). MR. LEFT ATRIUM MODERATELY DILATED. RIGHT ATRIUM MILDLY DILATED. ATRIAL SEPTUM NORMAL. ATRIAL PACED RHYTHM WITH INTACT A-V CONDUCTION.  Marland Kitchen enrhythm generator     implant of a medtronic generator  . interrogation     atrial and ventricular  . NUCLEAR STRESS TEST N/A 03/18/09   NORMAL PATTERN OF PERFUSION IN ALL REGIONS. LEFT VENTRICULAR SIZE IS NORMAL. NO EVID OF INDUCIBLE ISCHEMIA. EF 77%. NORMAL MYOCARDIAL PERFUSION STUDY.  Marland Kitchen PACEMAKER GENERATOR CHANGE N/A 07/24/2013   Procedure: PACEMAKER GENERATOR CHANGE;  Surgeon: Sanda Klein, MD;  Location: [facility] CATH LAB;  Service: Cardiovascular;  Laterality: N/A;  . PULSE GENERATOR IMPLANT      There were no vitals filed for this visit.    Wound Therapy - 05/23/17 1541    Subjective  Patient is pleasant and smiling upon arrival. He is mostly non-verbal however says singloe owrds when asked simple questions. After treatment he states "is it over?Marland KitchenMarland KitchenMarland KitchenWe did good." He is accompanied by his caregiver Gerald Stabs.    Patient and Family Stated Goals  Wound to heal and patient to not be in pain    Prior Treatments  home RN for 2 weeks, ended last week and recommended wound care referral  Pain Scale  Faces    Faces Pain Scale  No hurt    Evaluation and Treatment Procedures Explained to Patient/Family  Yes    Evaluation and Treatment Procedures  Patient unable to consent due to mental status family and caregiver consent to treatment    Pressure Injury Properties Date First Assessed: 04/13/17 Time First Assessed: 8413 Location: Sacrum Location Orientation: Posterior;Mid Staging: Stage IV - Full thickness tissue loss with exposed bone, tendon or muscle. Wound Description (Comments): large open wound with yellow/greenish slough present in openign and on wound bed and adheared to wound margins Present on Admission: Yes   Dressing Type   Foam;Gauze (Comment);Hydrogel;Moist to moist    Dressing  Changed;Old drainage (marked)    State of Healing  Early/partial granulation    Site / Wound Assessment  Granulation tissue;Yellow;Red    % Wound base Red or Granulating  -- 97%    % Wound base Yellow/Fibrinous Exudate  -- 3%    % Wound base Black/Eschar  0%    Peri-wound Assessment  Pink    Undermining (cm)  all directions    Margins  Epibole (rolled edges)    Drainage Amount  Moderate    Drainage Description  Serous    Treatment  Cleansed;Debridement (Selective);Packing (Saline gauze)    Pressure Injury Properties Date First Assessed: 04/13/17 Time First Assessed: 1124 Location: Sacrum Location Orientation: Right;Distal;Posterior Staging: Unstageable - Full thickness tissue loss in which the base of the ulcer is covered by slough (yellow, tan, gray, green or brown) and/or eschar (tan, brown or black) in the wound bed. Wound Description (Comments): located right and distally to central sacral wound Present on Admission: Yes   Pressure Injury Properties Date First Assessed: 04/13/17 Time First Assessed: 1124 Location: Sacrum Location Orientation: Left;Mid Staging: Deep Tissue Injury - Purple or maroon localized area of discolored intact skin or blood-filled blister due to damage of underlying soft tissue from pressure and/or shear. Wound Description (Comments): connected to and located on left/slightly supirior board of central sacral wound, located between 8 and 12 o'clock Present on Admission: Yes   Selective Debridement - Location  sacral wound; epiboled edges     Selective Debridement - Tools Used  Forceps    Selective Debridement - Tissue Removed  biofilm and , wound edges to promote healing    Wound Therapy - Clinical Statement  Wound does not have any increased odor however does have copious drainage therefor alginate was added to absorb drainage. Edges remain firmly attached and the patient has been referred back to MD for silver  nitrate stick to assist in opening edges; he has an appointment this Thursday before next wound treatment session. Continued with sharp debridement for removal of biofilm and epibolied wound edges to promote healing. Continued packing undermining with hydrogel and gauze, and sacral foam patch.     Wound Therapy - Functional Problem List  decreased mobility, dependent for pressure relief, poor nutritional intake    Factors Delaying/Impairing Wound Healing  Immobility    Hydrotherapy Plan  Debridement;Dressing change;Patient/family education    Wound Therapy - Frequency  2X / week    Wound Therapy - Current Recommendations  PT    Wound Plan  Continue to thoroughly cleanse wound with sharps debridement as necessary and correct dressing to promote healing environment. Continue with hydrogel in wound bed to improve packing removal and prevent damage to healthy tissue.  Measure on Wednesdays/Thursdays.     Dressing   saline/hydrogel soaked gauze packed into  wound with  sacral patch covering.     Decrease Length/Width/Depth by (cm)  Wound volume/surface area will decrease by 50%  or greater    Decrease Length/Width/Depth - Progress  Progressing toward goal    Improve Drainage Characteristics  Min no signs of local infection with no purulent drainage an no     Improve Drainage Characteristics - Progress  Progressing toward goal    Patient/Family will be able to   Family/caregiver will demonstrate proper inflation and fit technique for wheelchair cushion to provide optimal pressure relief for patient's sacrum while seated in wheelchair.    Patient/Family Instruction Goal - Progress  Progressing toward goal    Additional Wound Therapy Goal  Patient's family/caregiver will verbalize understanding of pressure sensitive areas and reports appropriate safe methods to prevent pressure sores from developing and verbalize understanding of pressure relief recommendations and report pressure relief schedule to improve  skin integrity and decrease breakdown.    Additional Wound Therapy Goal - Progress  Progressing toward goal    Goals/treatment plan/discharge plan were made with and agreed upon by patient/family  Yes    Time For Goal Achievement  -- 2-8 weeks    Wound Therapy - Potential for Goals  Good         PT Education - 05/23/17 1549    Education provided  Yes    Education Details  Provided handouts on pressure sensitive areas and preventions of pressure sores for patients caregivers and his daughter.     Person(s) Educated  Patient    Methods  Explanation;Handout    Comprehension  Verbalized understanding       PT Short Term Goals - 05/09/17 1645      PT SHORT TERM GOAL #1   Title  Patient's family/caregiver will verbalize understanding of pressure sensitive areas and reports appropriate safe methods to prevent pressure sores from developing.     Baseline  05/09/17 - caregiver chris verbalizes pressure sensitive areas    Time  2    Period  Weeks    Status  Partially Met      PT SHORT TERM GOAL #2   Title  Patient's family/caregiver will verbalize understanding of pressure relief recommendations and report pressure relief schedule to improve skin integrity and decrease breakdown.    Baseline  05/09/17 - caregiver chris verbalizes pressure relief timing    Time  2    Period  Weeks    Status  Partially Met      PT SHORT TERM GOAL #3   Title  Family/caregiver will demonstrate proper inflation and fit technique for wheelchair cushion to provide optimal pressure relief for patient's sacrum while seated in wheelchair.    Baseline  05/09/17 - no report of attmept to purchase at this time    Time  4    Period  Weeks    Status  On-going        PT Long Term Goals - 05/09/17 1644      PT LONG TERM GOAL #1   Title  Wound will decrease volume by 50% and there will be no signs of infection surround wound/periwound to show improved tissue health.    Time  8    Period  Weeks    Status  On-going         Plan - 05/23/17 1551    Clinical Impression Statement  See above. Pateint has appointment this Thursday before next wound treatment session to see MD and possibly use silver nitrate  to address epiboled edges.     Rehab Potential  Poor    Clinical Impairments Affecting Rehab Potential  decreased mobility.    PT Frequency  3x / week    PT Duration  8 weeks    PT Treatment/Interventions  ADLs/Self Care Home Management;Patient/family education;Manual techniques;Therapeutic activities;Passive range of motion;Other (comment);Taping selective debridement, pulsed lavage, cleansing and dressing wound    PT Next Visit Plan  measure wound     Consulted and Agree with Plan of Care  Patient;Family member/caregiver    Family Member Consulted  caregiver, Gerald Stabs       Patient will benefit from skilled therapeutic intervention in order to improve the following deficits and impairments:  Decreased skin integrity, Improper body mechanics, Pain, Decreased mobility  Visit Diagnosis: Pressure injury of sacral region, stage 4 ([facility])  Pressure injury of sacral region, unstageable ([place])  Other abnormalities of gait and mobility     Problem List Patient Active Problem List   Diagnosis Date Noted  . Symptomatic sinus bradycardia 07/24/2013  . Pacemaker battery depletion 07/17/2013  . Preoperative clearance 02/15/2013  . Subdural hematoma ([place]) 02/13/2013  . Falls frequently 11/15/2012  . Long term (current) use of anticoagulants 04/21/2012  . Hx of adenomatous colonic polyps 02/19/2012  . Constipation 10/07/2011  . UNSPECIFIED HYPOTHYROIDISM 06/18/2009  . Essential hypertension 06/18/2009  . ATRIAL FIBRILLATION 06/18/2009  . ORTHOSTATIC HYPOTENSION 06/18/2009  . SYNCOPE AND COLLAPSE 06/18/2009  . Cardiac pacemaker in situ 06/18/2009    [name], PT, DPT Physical Therapist with [facility]  05/23/2017 3:53 PM    [facility] [address] [place], [place], [address] Phone: [phone]   Fax:  [phone]  Name: Reginald [name] MRN: [id number] Date of Birth: [birth year]

## 2017-05-25 ENCOUNTER — Ambulatory Visit (HOSPITAL_COMMUNITY): Payer: Medicare Other

## 2017-05-26 ENCOUNTER — Telehealth (HOSPITAL_COMMUNITY): Payer: Self-pay

## 2017-05-26 ENCOUNTER — Ambulatory Visit (HOSPITAL_COMMUNITY): Payer: Medicare Other

## 2017-05-26 ENCOUNTER — Other Ambulatory Visit: Payer: Self-pay

## 2017-05-26 ENCOUNTER — Encounter (HOSPITAL_COMMUNITY): Payer: Self-pay

## 2017-05-26 DIAGNOSIS — L89154 Pressure ulcer of sacral region, stage 4: Secondary | ICD-10-CM | POA: Diagnosis not present

## 2017-05-26 DIAGNOSIS — R2689 Other abnormalities of gait and mobility: Secondary | ICD-10-CM

## 2017-05-26 DIAGNOSIS — L89150 Pressure ulcer of sacral region, unstageable: Secondary | ICD-10-CM | POA: Diagnosis not present

## 2017-05-26 NOTE — Therapy (Signed)
Reginald [name], [place], [address] Phone: [phone]   Fax:  [phone]  Wound Care Therapy  Patient Details  Name: Reginald [name] MRN: [id number] Date of Birth: 02-09-1924 Referring Provider: Neale Burly, MD   Encounter Date: 05/26/2017  Progress Note Reporting Period 05/09/17 to 05/26/17  See note below for Objective Data and Assessment of Progress/Goals.   PHYSICAL THERAPY DISCHARGE SUMMARY  Visits from Start of Care: 17  Current functional level related to goals / functional outcomes: Wound is well healing granulated with serous drainage and no odor. Edges remain firmly attached and the patient has been referred back to MD for silver nitrate stick to assist in opening edges. He had an appointment today but the MD did not have silver nitrate available and was unable to address edges. I have called and spoken to MD who will set up an appointment with patient as soon as silver nitrate comes in. I educated patient's caregiver and called his daughter informing them that the patient is ready to be discharged from skilled wound care and can be managed with home health nursing. I performed some selective debridement at wound edges and to remove biofilm and to promote healing. Continued packing undermining with hydrogel and gauze, and sacral foam patch. Patient will be discharged to [place] and follow up with MD following this session.    Remaining deficits: See below   Education / Equipment: Educated on current wound healing status and on need for follow up visit with MD for silver nitrate to edges of wound to promote healing. Educated patietn is ready to discharge to [facility] for packing and cleansing wound.   Plan: Patient agrees to discharge.  Patient goals were not met. Patient is being discharged due to meeting the stated rehab goals.  ?????      PT End of Session - 05/26/17 1808    Visit Number   17    Number of Visits  25    Date for PT Re-Evaluation  06/08/17 Mini-reassess 05/11/17    Authorization Type  [employer]    Authorization Time Period  04/13/17 - 06/08/17    Authorization - Visit Number  7    Authorization - Number of Visits  10    PT Start Time  1351    PT Stop Time  1424    PT Time Calculation (min)  33 min    Activity Tolerance  Patient tolerated treatment well    Behavior During Therapy  WFL for tasks assessed/performed       Past Medical History:  Diagnosis Date  . Anxiety neurosis   . Arrhythmia    afib  . Atrial fib/flutter, transient   . BPH (benign prostatic hyperplasia)   . Coronary artery disease    MINOR  . Hyperlipidemia   . OBS (organic brain syndrome)   . Orthostatic hypotension   . Syncope and collapse   . Vertigo     Past Surgical History:  Procedure Laterality Date  . APPENDECTOMY    . CARDIAC CATHETERIZATION  06/17/97   FALSE/POSITIVE TREADMILL EXERCISE TEST. MILD CAD WITH NL LV FUNCTION. MILD HYPERTENSION. FAMILY HX OF CAD, REMOTE SMOKER. PAST HX OF PAF.  Marland Kitchen CAROTID DUPLEX Bilateral 03/13/01   SM AMT OF NON HEMODYNAMICALLY SIGN PLAQUE IN THE RIGHT CAROTID BULB, <39% STENOSIS.  Marland Kitchen COLONOSCOPY  10/20/2011   Procedure: COLONOSCOPY;  Surgeon: Reginald Houston, MD;  [facility];  Service: Endoscopy;  Laterality: N/A;  . CRANIOTOMY N/A 02/15/2013   Procedure: CRANIOTOMY HEMATOMA EVACUATION SUBDURAL;  Surgeon: Reginald Spangle, MD;  Location: [facility] NEURO ORS;  Service: Neurosurgery;  Laterality: N/A;  . DOPPLER ECHOCARDIOGRAPHY N/A 02/01/12   LV CAVITY SIZE IS NORMAL. MILD CONCENTRIC HYPERTROPHY. EF 55-60%. PARAMETERS ARE CONSISTANT WITH ABNORMAL LV RELAXATION.(GRADE 1 DIASTOLIC DYSFUNCTION). MR. LEFT ATRIUM MODERATELY DILATED. RIGHT ATRIUM MILDLY DILATED. ATRIAL SEPTUM NORMAL. ATRIAL PACED RHYTHM WITH INTACT A-V CONDUCTION.  Marland Kitchen enrhythm generator     implant of a medtronic generator  . interrogation     atrial and  ventricular  . NUCLEAR STRESS TEST N/A 03/18/09   NORMAL PATTERN OF PERFUSION IN ALL REGIONS. LEFT VENTRICULAR SIZE IS NORMAL. NO EVID OF INDUCIBLE ISCHEMIA. EF 77%. NORMAL MYOCARDIAL PERFUSION STUDY.  Marland Kitchen PACEMAKER GENERATOR CHANGE N/A 07/24/2013   Procedure: PACEMAKER GENERATOR CHANGE;  Surgeon: Reginald Klein, MD;  Location: [place] CATH LAB;  Service: Cardiovascular;  Laterality: N/A;  . PULSE GENERATOR IMPLANT      There were no vitals filed for this visit.    Wound Therapy - 05/26/17 1813    Subjective  Patient is pleasant and smiling upon arrival. He is mostly non-verbal. Accompanied by caregiver  Reginald [name].    Patient and Family Stated Goals  Wound to heal and patient to not be in pain    Pain Scale  Faces    Faces Pain Scale  No hurt    Evaluation and Treatment Procedures Explained to Patient/Family  Yes    Evaluation and Treatment Procedures  Patient unable to consent due to mental status family and caregiver consent to treatment    Pressure Injury Properties Date First Assessed: 04/13/17 Time First Assessed: 1124 Location: Sacrum Location Orientation: Posterior;Mid Staging: Stage IV - Full thickness tissue loss with exposed bone, tendon or muscle. Wound Description (Comments): large open wound with yellow/greenish slough present in openign and on wound bed and adheared to wound margins Present on Admission: Yes   Dressing Type  Foam;Gauze (Comment);Hydrogel;Moist to moist    Dressing  Changed;Old drainage (marked)    Dressing Change Frequency  PRN    State of Healing  Early/partial granulation    Site / Wound Assessment  Granulation tissue;Yellow;Red    % Wound base Red or Granulating  -- 97%    % Wound base Yellow/Fibrinous Exudate  -- 3%    % Wound base Black/Eschar  0%    Peri-wound Assessment  Pink    Wound Length (cm)  2.1 cm    Wound Width (cm)  2.4 cm    Wound Depth (cm)  0.8 cm    Wound Surface Area (cm^2)  5.04 cm^2    Wound Volume (cm^3)  4.03 cm^3    Undermining (cm)  No  undermining present from 12:00 to 6:00. Undermining is 0.6 cm in depth extending from 6:00 to 8:00; 0.8 cm in depth extending at 9:00; and 1.6 cm in depth extending from 9:00 to 11:00; and 1.4 cm in depth extending from 11:00 to 12:00.    Margins  Epibole (rolled edges)    Drainage Amount  Moderate    Drainage Description  Serous    Treatment  Cleansed;Debridement (Selective)    Pressure Injury Properties Date First Assessed: 04/13/17 Time First Assessed: 8676 Location: Sacrum Location Orientation: Right;Distal;Posterior Staging: Unstageable - Full thickness tissue loss in which the base of the ulcer is covered by slough (yellow, tan, gray, green or brown) and/or eschar (tan, brown or  black) in the wound bed. Wound Description (Comments): located right and distally to central sacral wound Present on Admission: Yes   Pressure Injury Properties Date First Assessed: 04/13/17 Time First Assessed: 1124 Location: Sacrum Location Orientation: Left;Mid Staging: Deep Tissue Injury - Purple or maroon localized area of discolored intact skin or blood-filled blister due to damage of underlying soft tissue from pressure and/or shear. Wound Description (Comments): connected to and located on left/slightly supirior board of central sacral wound, located between 8 and 12 o'clock Present on Admission: Yes   Selective Debridement - Location  sacral wound; epiboled edges     Selective Debridement - Tools Used  Forceps    Selective Debridement - Tissue Removed  biofilm and , wound edges to promote healing    Wound Therapy - Clinical Statement  Wound is well healing granulated with serous drainage and no odor. Edges remain firmly attached and the patient has been referred back to MD for silver nitrate stick to assist in opening edges. He had an appointment today but the MD did not have silver nitrate available and was unable to address edges. I have called and spoken to MD who will set up an appointment with patient as soon as  silver nitrate comes in. I educated patient's caregiver and called his daughter informing them that the patient is ready to be discharged from skilled wound care and can be managed with home health nursing. I performed some selective debridement at wound edges and to remove biofilm and to promote healing. Continued packing undermining with hydrogel and gauze, and sacral foam patch. Patient will be discharged to [place] and follow up with MD following this session.    Dressing   saline/hydrogel soaked gauze packed into wound with  sacral patch covering.     Decrease Length/Width/Depth by (cm)  Wound volume/surface area will decrease by 50%  or greater    Decrease Length/Width/Depth - Progress  Met    Improve Drainage Characteristics  Min no signs of local infection with no purulent drainage    Improve Drainage Characteristics - Progress  Partly met moderat drainage, no signs of infection    Patient/Family will be able to   Family/caregiver will demonstrate proper inflation and fit technique for wheelchair cushion to provide optimal pressure relief for patient's sacrum while seated in wheelchair.    Patient/Family Instruction Goal - Progress  Discontinued (comment)    Additional Wound Therapy Goal  Patient's family/caregiver will verbalize understanding of pressure sensitive areas and reports appropriate safe methods to prevent pressure sores from developing and verbalize understanding of pressure relief recommendations and report pressure relief schedule to improve skin integrity and decrease breakdown.    Additional Wound Therapy Goal - Progress  Partly met caregiver chris verbalized    Goals/treatment plan/discharge plan were made with and agreed upon by patient/family  Yes    Time For Goal Achievement  -- 2-8 weeks    Wound Therapy - Potential for Goals  Good        PT Education - 05/26/17 1807    Education provided  Yes    Education Details  Educated on current wound healing status and on  need for follow up visit with MD for silver nitrate to edges of wound to promote healing. Educated patietn is ready to discharge to [facility] for packing and cleansing wound.     Person(s) Educated  Patient;Caregiver(s)    Methods  Explanation    Comprehension  Verbalized understanding  PT Short Term Goals - 05/26/17 1812      PT SHORT TERM GOAL #1   Title  Patient's family/caregiver will verbalize understanding of pressure sensitive areas and reports appropriate safe methods to prevent pressure sores from developing.     Time  2    Period  Weeks    Status  Achieved      PT SHORT TERM GOAL #2   Title  Patient's family/caregiver will verbalize understanding of pressure relief recommendations and report pressure relief schedule to improve skin integrity and decrease breakdown.    Time  2    Period  Weeks    Status  Achieved      PT SHORT TERM GOAL #3   Title  Family/caregiver will demonstrate proper inflation and fit technique for wheelchair cushion to provide optimal pressure relief for patient's sacrum while seated in wheelchair.    Baseline  05/09/17 - no report of attmept to purchase at this time    Time  4    Period  Weeks    Status  Deferred       PT Long Term Goals - 05/26/17 1812      PT LONG TERM GOAL #1   Title  Wound will decrease volume by 50% and there will be no signs of infection surround wound/periwound to show improved tissue health.    Baseline  volume was 8.4 cm^3 and is now 4.03 cm^3    Time  8    Period  Weeks    Status  Achieved        Plan - 05/26/17 1810    Clinical Impression Statement  see above. Patient is discharging to [facility] for wound care and I have called MD to schedule this. Patient will need follow up with MD for silver nitrate application to wound edges.    Rehab Potential  Poor    Clinical Impairments Affecting Rehab Potential  decreased mobility.    PT Frequency  3x / week    PT Duration  8 weeks    PT  Treatment/Interventions  ADLs/Self Care Home Management;Patient/family education;Manual techniques;Therapeutic activities;Passive range of motion;Other (comment);Taping selective debridement, pulsed lavage, cleansing and dressing wound    PT Next Visit Plan  discharge this date    Consulted and Agree with Plan of Care  Patient;Family member/caregiver    Family Member Consulted  caregiver, Reginald [name]       Patient will benefit from skilled therapeutic intervention in order to improve the following deficits and impairments:  Decreased skin integrity, Improper body mechanics, Pain, Decreased mobility  Visit Diagnosis: Pressure injury of sacral region, stage 4 ([facility])  Pressure injury of sacral region, unstageable ([place])  Other abnormalities of gait and mobility     Problem List Patient Active Problem List   Diagnosis Date Noted  . Symptomatic sinus bradycardia 07/24/2013  . Pacemaker battery depletion 07/17/2013  . Preoperative clearance 02/15/2013  . Subdural hematoma ([facility]) 02/13/2013  . Falls frequently 11/15/2012  . Long term (current) use of anticoagulants 04/21/2012  . Hx of adenomatous colonic polyps 02/19/2012  . Constipation 10/07/2011  . UNSPECIFIED HYPOTHYROIDISM 06/18/2009  . Essential hypertension 06/18/2009  . ATRIAL FIBRILLATION 06/18/2009  . ORTHOSTATIC HYPOTENSION 06/18/2009  . SYNCOPE AND COLLAPSE 06/18/2009  . Cardiac pacemaker in situ 06/18/2009    [name], PT, DPT Physical Therapist with [facility]  05/26/2017 6:28 PM    [place], [place], [address]  Phone: [phone]   Fax:  [phone]  Name: ISHAN SANROMAN MRN: [id number] Date of Birth: 05/29/1924

## 2017-05-26 NOTE — Telephone Encounter (Signed)
I called Reginald [name] daughter [name] and left a message letting her know that we have discharged her father from skilled wound care and that I have called his MD to set up home health nursing to treat his wound at home. I Informed her the [facility] should begin by Tuesday and if they do not hear from an organization about this to call Dr. Sherrie Sport and make sure [facility] nursing has been set up. I provided her with our office number and told her to call if she has any questions. I also informed her that [name] will need a follow up with Dr. Sherrie Sport when they get the silver nitrate in to continue with wound healing.   [name], PT, DPT Physical Therapist with [facility]  05/26/2017 6:34 PM

## 2017-05-27 ENCOUNTER — Ambulatory Visit (HOSPITAL_COMMUNITY): Payer: Medicare Other

## 2017-05-31 ENCOUNTER — Ambulatory Visit (HOSPITAL_COMMUNITY): Payer: Medicare Other

## 2017-06-01 ENCOUNTER — Ambulatory Visit (HOSPITAL_COMMUNITY): Payer: Medicare Other | Admitting: Physical Therapy

## 2017-06-02 ENCOUNTER — Ambulatory Visit (HOSPITAL_COMMUNITY): Payer: Medicare Other

## 2017-06-02 DIAGNOSIS — L89154 Pressure ulcer of sacral region, stage 4: Secondary | ICD-10-CM | POA: Diagnosis not present

## 2017-06-02 DIAGNOSIS — Z87891 Personal history of nicotine dependence: Secondary | ICD-10-CM | POA: Diagnosis not present

## 2017-06-02 DIAGNOSIS — Z87440 Personal history of urinary (tract) infections: Secondary | ICD-10-CM | POA: Diagnosis not present

## 2017-06-03 ENCOUNTER — Ambulatory Visit (HOSPITAL_COMMUNITY): Payer: Medicare Other

## 2017-06-06 ENCOUNTER — Ambulatory Visit (HOSPITAL_COMMUNITY): Payer: Medicare Other

## 2017-06-06 DIAGNOSIS — Z87440 Personal history of urinary (tract) infections: Secondary | ICD-10-CM | POA: Diagnosis not present

## 2017-06-06 DIAGNOSIS — L89154 Pressure ulcer of sacral region, stage 4: Secondary | ICD-10-CM | POA: Diagnosis not present

## 2017-06-06 DIAGNOSIS — Z87891 Personal history of nicotine dependence: Secondary | ICD-10-CM | POA: Diagnosis not present

## 2017-06-08 ENCOUNTER — Ambulatory Visit (HOSPITAL_COMMUNITY): Payer: Medicare Other

## 2017-06-08 DIAGNOSIS — I10 Essential (primary) hypertension: Secondary | ICD-10-CM | POA: Diagnosis not present

## 2017-06-08 DIAGNOSIS — R269 Unspecified abnormalities of gait and mobility: Secondary | ICD-10-CM | POA: Diagnosis not present

## 2017-06-08 DIAGNOSIS — G909 Disorder of the autonomic nervous system, unspecified: Secondary | ICD-10-CM | POA: Diagnosis not present

## 2017-06-08 DIAGNOSIS — M6281 Muscle weakness (generalized): Secondary | ICD-10-CM | POA: Diagnosis not present

## 2017-06-08 DIAGNOSIS — Z9181 History of falling: Secondary | ICD-10-CM | POA: Diagnosis not present

## 2017-06-09 ENCOUNTER — Ambulatory Visit (HOSPITAL_COMMUNITY): Payer: Medicare Other | Admitting: Physical Therapy

## 2017-06-09 DIAGNOSIS — Z87440 Personal history of urinary (tract) infections: Secondary | ICD-10-CM | POA: Diagnosis not present

## 2017-06-09 DIAGNOSIS — Z87891 Personal history of nicotine dependence: Secondary | ICD-10-CM | POA: Diagnosis not present

## 2017-06-09 DIAGNOSIS — L89154 Pressure ulcer of sacral region, stage 4: Secondary | ICD-10-CM | POA: Diagnosis not present

## 2017-06-10 ENCOUNTER — Ambulatory Visit (HOSPITAL_COMMUNITY): Payer: Medicare Other | Admitting: Physical Therapy

## 2017-06-13 DIAGNOSIS — L89154 Pressure ulcer of sacral region, stage 4: Secondary | ICD-10-CM | POA: Diagnosis not present

## 2017-06-13 DIAGNOSIS — Z87891 Personal history of nicotine dependence: Secondary | ICD-10-CM | POA: Diagnosis not present

## 2017-06-13 DIAGNOSIS — Z87440 Personal history of urinary (tract) infections: Secondary | ICD-10-CM | POA: Diagnosis not present

## 2017-06-16 DIAGNOSIS — Z9181 History of falling: Secondary | ICD-10-CM | POA: Diagnosis not present

## 2017-06-16 DIAGNOSIS — L89154 Pressure ulcer of sacral region, stage 4: Secondary | ICD-10-CM | POA: Diagnosis not present

## 2017-06-16 DIAGNOSIS — M6281 Muscle weakness (generalized): Secondary | ICD-10-CM | POA: Diagnosis not present

## 2017-06-16 DIAGNOSIS — I10 Essential (primary) hypertension: Secondary | ICD-10-CM | POA: Diagnosis not present

## 2017-06-16 DIAGNOSIS — G909 Disorder of the autonomic nervous system, unspecified: Secondary | ICD-10-CM | POA: Diagnosis not present

## 2017-06-16 DIAGNOSIS — Z87440 Personal history of urinary (tract) infections: Secondary | ICD-10-CM | POA: Diagnosis not present

## 2017-06-16 DIAGNOSIS — R269 Unspecified abnormalities of gait and mobility: Secondary | ICD-10-CM | POA: Diagnosis not present

## 2017-06-16 DIAGNOSIS — Z87891 Personal history of nicotine dependence: Secondary | ICD-10-CM | POA: Diagnosis not present

## 2017-06-20 DIAGNOSIS — Z87440 Personal history of urinary (tract) infections: Secondary | ICD-10-CM | POA: Diagnosis not present

## 2017-06-20 DIAGNOSIS — L89154 Pressure ulcer of sacral region, stage 4: Secondary | ICD-10-CM | POA: Diagnosis not present

## 2017-06-20 DIAGNOSIS — L89812 Pressure ulcer of head, stage 2: Secondary | ICD-10-CM | POA: Diagnosis not present

## 2017-06-20 DIAGNOSIS — Z87891 Personal history of nicotine dependence: Secondary | ICD-10-CM | POA: Diagnosis not present

## 2017-06-22 DIAGNOSIS — Z87440 Personal history of urinary (tract) infections: Secondary | ICD-10-CM | POA: Diagnosis not present

## 2017-06-22 DIAGNOSIS — Z87891 Personal history of nicotine dependence: Secondary | ICD-10-CM | POA: Diagnosis not present

## 2017-06-22 DIAGNOSIS — L89154 Pressure ulcer of sacral region, stage 4: Secondary | ICD-10-CM | POA: Diagnosis not present

## 2017-06-23 DIAGNOSIS — L89154 Pressure ulcer of sacral region, stage 4: Secondary | ICD-10-CM | POA: Diagnosis not present

## 2017-06-27 DIAGNOSIS — Z87891 Personal history of nicotine dependence: Secondary | ICD-10-CM | POA: Diagnosis not present

## 2017-06-27 DIAGNOSIS — Z87440 Personal history of urinary (tract) infections: Secondary | ICD-10-CM | POA: Diagnosis not present

## 2017-06-27 DIAGNOSIS — L89154 Pressure ulcer of sacral region, stage 4: Secondary | ICD-10-CM | POA: Diagnosis not present

## 2017-06-28 DIAGNOSIS — Z87440 Personal history of urinary (tract) infections: Secondary | ICD-10-CM | POA: Diagnosis not present

## 2017-06-28 DIAGNOSIS — L89154 Pressure ulcer of sacral region, stage 4: Secondary | ICD-10-CM | POA: Diagnosis not present

## 2017-06-28 DIAGNOSIS — Z87891 Personal history of nicotine dependence: Secondary | ICD-10-CM | POA: Diagnosis not present

## 2017-06-30 DIAGNOSIS — R269 Unspecified abnormalities of gait and mobility: Secondary | ICD-10-CM | POA: Diagnosis not present

## 2017-06-30 DIAGNOSIS — Z87891 Personal history of nicotine dependence: Secondary | ICD-10-CM | POA: Diagnosis not present

## 2017-06-30 DIAGNOSIS — I10 Essential (primary) hypertension: Secondary | ICD-10-CM | POA: Diagnosis not present

## 2017-06-30 DIAGNOSIS — L89154 Pressure ulcer of sacral region, stage 4: Secondary | ICD-10-CM | POA: Diagnosis not present

## 2017-06-30 DIAGNOSIS — M6281 Muscle weakness (generalized): Secondary | ICD-10-CM | POA: Diagnosis not present

## 2017-06-30 DIAGNOSIS — G909 Disorder of the autonomic nervous system, unspecified: Secondary | ICD-10-CM | POA: Diagnosis not present

## 2017-06-30 DIAGNOSIS — Z9181 History of falling: Secondary | ICD-10-CM | POA: Diagnosis not present

## 2017-06-30 DIAGNOSIS — Z87440 Personal history of urinary (tract) infections: Secondary | ICD-10-CM | POA: Diagnosis not present

## 2017-07-14 ENCOUNTER — Other Ambulatory Visit: Payer: Self-pay | Admitting: Cardiovascular Disease

## 2017-07-14 DIAGNOSIS — L89812 Pressure ulcer of head, stage 2: Secondary | ICD-10-CM | POA: Diagnosis not present

## 2017-07-20 DIAGNOSIS — W19XXXA Unspecified fall, initial encounter: Secondary | ICD-10-CM | POA: Diagnosis not present

## 2017-07-20 DIAGNOSIS — S79911A Unspecified injury of right hip, initial encounter: Secondary | ICD-10-CM | POA: Diagnosis not present

## 2017-07-20 DIAGNOSIS — S7001XA Contusion of right hip, initial encounter: Secondary | ICD-10-CM | POA: Diagnosis not present

## 2017-07-20 DIAGNOSIS — S0083XA Contusion of other part of head, initial encounter: Secondary | ICD-10-CM | POA: Diagnosis not present

## 2017-07-20 DIAGNOSIS — M25551 Pain in right hip: Secondary | ICD-10-CM | POA: Diagnosis not present

## 2017-07-20 DIAGNOSIS — S0990XA Unspecified injury of head, initial encounter: Secondary | ICD-10-CM | POA: Diagnosis not present

## 2017-07-20 DIAGNOSIS — S0181XA Laceration without foreign body of other part of head, initial encounter: Secondary | ICD-10-CM | POA: Diagnosis not present

## 2017-07-20 DIAGNOSIS — S31000A Unspecified open wound of lower back and pelvis without penetration into retroperitoneum, initial encounter: Secondary | ICD-10-CM | POA: Diagnosis not present

## 2017-07-20 DIAGNOSIS — Z87891 Personal history of nicotine dependence: Secondary | ICD-10-CM | POA: Diagnosis not present

## 2017-07-20 DIAGNOSIS — S60418A Abrasion of other finger, initial encounter: Secondary | ICD-10-CM | POA: Diagnosis not present

## 2017-08-11 DIAGNOSIS — Z0000 Encounter for general adult medical examination without abnormal findings: Secondary | ICD-10-CM | POA: Diagnosis not present

## 2017-08-11 DIAGNOSIS — L89812 Pressure ulcer of head, stage 2: Secondary | ICD-10-CM | POA: Diagnosis not present

## 2017-08-27 ENCOUNTER — Other Ambulatory Visit: Payer: Self-pay | Admitting: Cardiovascular Disease

## 2017-10-10 ENCOUNTER — Other Ambulatory Visit: Payer: Self-pay | Admitting: Cardiovascular Disease

## 2017-11-14 DIAGNOSIS — N182 Chronic kidney disease, stage 2 (mild): Secondary | ICD-10-CM | POA: Diagnosis not present

## 2017-11-14 DIAGNOSIS — E7849 Other hyperlipidemia: Secondary | ICD-10-CM | POA: Diagnosis not present

## 2017-11-14 DIAGNOSIS — E038 Other specified hypothyroidism: Secondary | ICD-10-CM | POA: Diagnosis not present

## 2017-11-27 ENCOUNTER — Other Ambulatory Visit: Payer: Self-pay | Admitting: Cardiovascular Disease

## 2018-01-01 DIAGNOSIS — Z950 Presence of cardiac pacemaker: Secondary | ICD-10-CM | POA: Diagnosis not present

## 2018-01-01 DIAGNOSIS — R509 Fever, unspecified: Secondary | ICD-10-CM | POA: Diagnosis not present

## 2018-01-01 DIAGNOSIS — R05 Cough: Secondary | ICD-10-CM | POA: Diagnosis not present

## 2018-01-01 DIAGNOSIS — Z87891 Personal history of nicotine dependence: Secondary | ICD-10-CM | POA: Diagnosis not present

## 2018-01-01 DIAGNOSIS — E039 Hypothyroidism, unspecified: Secondary | ICD-10-CM | POA: Diagnosis not present

## 2018-01-01 DIAGNOSIS — J069 Acute upper respiratory infection, unspecified: Secondary | ICD-10-CM | POA: Diagnosis not present

## 2018-01-01 DIAGNOSIS — Z79899 Other long term (current) drug therapy: Secondary | ICD-10-CM | POA: Diagnosis not present

## 2018-01-01 DIAGNOSIS — E7800 Pure hypercholesterolemia, unspecified: Secondary | ICD-10-CM | POA: Diagnosis not present

## 2018-01-11 ENCOUNTER — Other Ambulatory Visit: Payer: Self-pay | Admitting: Cardiovascular Disease

## 2018-02-20 ENCOUNTER — Other Ambulatory Visit: Payer: Self-pay | Admitting: Cardiovascular Disease

## 2018-02-22 DIAGNOSIS — Z0000 Encounter for general adult medical examination without abnormal findings: Secondary | ICD-10-CM | POA: Diagnosis not present

## 2018-02-22 DIAGNOSIS — N182 Chronic kidney disease, stage 2 (mild): Secondary | ICD-10-CM | POA: Diagnosis not present

## 2018-02-22 DIAGNOSIS — E7849 Other hyperlipidemia: Secondary | ICD-10-CM | POA: Diagnosis not present

## 2018-02-22 DIAGNOSIS — E038 Other specified hypothyroidism: Secondary | ICD-10-CM | POA: Diagnosis not present

## 2018-02-22 DIAGNOSIS — Z1389 Encounter for screening for other disorder: Secondary | ICD-10-CM | POA: Diagnosis not present

## 2018-04-03 ENCOUNTER — Other Ambulatory Visit: Payer: Self-pay | Admitting: Cardiovascular Disease

## 2018-04-10 ENCOUNTER — Telehealth: Payer: Self-pay

## 2018-04-10 NOTE — Telephone Encounter (Signed)
Follow Up:   Daughter said she can do appt on 04-12-18. She says she can do 04-13-18 or Monday, Tuesday or Wednesday next week.

## 2018-04-10 NOTE — Telephone Encounter (Signed)
lmtcb

## 2018-04-11 NOTE — Telephone Encounter (Signed)
Left message to advise Reginald [name] to call back to reschedule Mr Chasen's appointment to a virtual visit at her convenience. Appointment for 4/8 cancelled.

## 2018-04-11 NOTE — Telephone Encounter (Signed)
lmtcb

## 2018-04-12 ENCOUNTER — Encounter: Payer: Medicare Other | Admitting: Cardiovascular Disease

## 2018-04-12 ENCOUNTER — Encounter

## 2018-04-13 ENCOUNTER — Telehealth: Payer: Self-pay | Admitting: Cardiovascular Disease

## 2018-04-13 ENCOUNTER — Other Ambulatory Visit: Payer: Self-pay

## 2018-04-13 NOTE — Telephone Encounter (Signed)
Smart phone/ my chart via text/ pre reg completed

## 2018-04-14 ENCOUNTER — Encounter: Payer: Self-pay | Admitting: Cardiovascular Disease

## 2018-04-14 ENCOUNTER — Telehealth (INDEPENDENT_AMBULATORY_CARE_PROVIDER_SITE_OTHER): Payer: Medicare Other | Admitting: Cardiovascular Disease

## 2018-04-14 ENCOUNTER — Ambulatory Visit (INDEPENDENT_AMBULATORY_CARE_PROVIDER_SITE_OTHER): Payer: Medicare Other | Admitting: *Deleted

## 2018-04-14 VITALS — Ht 66.0 in

## 2018-04-14 DIAGNOSIS — R296 Repeated falls: Secondary | ICD-10-CM

## 2018-04-14 DIAGNOSIS — R001 Bradycardia, unspecified: Secondary | ICD-10-CM

## 2018-04-14 DIAGNOSIS — I951 Orthostatic hypotension: Secondary | ICD-10-CM | POA: Diagnosis not present

## 2018-04-14 DIAGNOSIS — Z5181 Encounter for therapeutic drug level monitoring: Secondary | ICD-10-CM

## 2018-04-14 DIAGNOSIS — Z79899 Other long term (current) drug therapy: Secondary | ICD-10-CM

## 2018-04-14 DIAGNOSIS — S065X9S Traumatic subdural hemorrhage with loss of consciousness of unspecified duration, sequela: Secondary | ICD-10-CM

## 2018-04-14 DIAGNOSIS — I495 Sick sinus syndrome: Secondary | ICD-10-CM

## 2018-04-14 DIAGNOSIS — I480 Paroxysmal atrial fibrillation: Secondary | ICD-10-CM | POA: Diagnosis not present

## 2018-04-14 DIAGNOSIS — E039 Hypothyroidism, unspecified: Secondary | ICD-10-CM

## 2018-04-14 DIAGNOSIS — S065X9A Traumatic subdural hemorrhage with loss of consciousness of unspecified duration, initial encounter: Secondary | ICD-10-CM

## 2018-04-14 DIAGNOSIS — Z950 Presence of cardiac pacemaker: Secondary | ICD-10-CM | POA: Diagnosis not present

## 2018-04-14 DIAGNOSIS — S065XAA Traumatic subdural hemorrhage with loss of consciousness status unknown, initial encounter: Secondary | ICD-10-CM

## 2018-04-14 NOTE — Patient Instructions (Addendum)
Medication Instructions:  The current medical regimen is effective;  continue present plan and medications.  If you need a refill on your cardiac medications before your next appointment, please call your pharmacy.   Lab work: TSH, LFT test (non urgent) If you have labs (blood work) drawn today and your tests are completely normal, you will receive your results only by: Marland Kitchen MyChart Message (if you have MyChart) OR . A paper copy in the mail If you have any lab test that is abnormal or we need to change your treatment, we will call you to review the results.  Follow-Up: At [facility], you and your health needs are our priority.  As part of our continuing mission to provide you with exceptional heart care, we have created designated Provider Care Teams.  These Care Teams include your primary Cardiologist (physician) and Advanced Practice Providers (APPs -  Physician Assistants and Nurse Practitioners) who all work together to provide you with the care you need, when you need it. You will need a follow up appointment in 12 months.  Please call our office 2 months in advance to schedule this appointment.  You may see Dr.Kelechi Astarita or one of the following Advanced Practice Providers on your designated Care Team: Almyra Deforest, [place] . Fabian Sharp, PA-C  Any Other Special Instructions Will Be Listed Below (If Applicable). Do a pacemaker download today. Schedule another PM download to device clinic in 3 months. -they will contact regarding this appointment.

## 2018-04-14 NOTE — Progress Notes (Signed)
Sorry, it should say "history of subdural hematoma" or "history of intracranial hemorrhage" Not an active problem. MCr

## 2018-04-14 NOTE — Progress Notes (Signed)
Virtual Visit via Video Note   This visit type was conducted due to national recommendations for restrictions regarding the COVID-19 Pandemic (e.g. social distancing) in an effort to limit this patient's exposure and mitigate transmission in our community.  Due to his co-morbid illnesses, this patient is at least at moderate risk for complications without adequate follow up.  This format is felt to be most appropriate for this patient at this time.  All issues noted in this document were discussed and addressed.  A limited physical exam was performed with this format.  Please refer to the patient's chart for his consent to telehealth for [facility].   Evaluation Performed:  Follow-up visit  Date:  04/14/2018   ID:  Reginald [name], DOB [birth year], MRN [id number]  Patient Location: Home  Provider Location: Home  PCP:  Neale Burly, MD  Cardiologist:  Doneen Ollinger Electrophysiologist:  None   Chief Complaint:  atrial fibrillation, pacemaker follow up  History of Present Illness:    Reginald [name] is a 85 y.o. male who presents via audio/video conferencing for a telehealth visit today.    Mr. Deller is very hard of hearing and has dementia.  Most of the discussion and review of systems is obtained from his daughter.  He is actually doing better since his last stay in rehab.  He is eating well and has gained weight.  Unfortunately he has developed severe the lower limbs and even some muscle contractures after laying in bed for weeks.  He had toe gangrene that required debridement and wound care and was unable to walk during that time.  He has a caregiver that comes in in the mornings and gets him out of bed puts him in a wheelchair and then he scoots himself around the house. If the weather is nice, the caregiver will also take him for a ride outside. Unfortunately, his memory problems have only worsened.  His speech is sometimes not intelligible.  He does not recognize his own family members,  but is cooperative.  As far as we can tell he is not having any issues with shortness of breath or chest pain.  He has not experienced syncope.  Since he no longer stands up, orthostatic hypotension has not been a problem and he is not taking Midodrine.  He has not had a pacemaker check since the office visit in August 2018.  His daughter states that she still has a Transport planner.  Multiple transmissions were missed due to his hospitalizations and rehab stays.  she says she will do a transmission today.  The patient does not have symptoms concerning for COVID-19 infection (fever, chills, cough, or new shortness of breath).    Past Medical History:  Diagnosis Date   Anxiety neurosis    Arrhythmia    afib   Atrial fib/flutter, transient    BPH (benign prostatic hyperplasia)    Coronary artery disease    MINOR   Hyperlipidemia    OBS (organic brain syndrome)    Orthostatic hypotension    Syncope and collapse    Vertigo    Past Surgical History:  Procedure Laterality Date   APPENDECTOMY     CARDIAC CATHETERIZATION  06/17/97   FALSE/POSITIVE TREADMILL EXERCISE TEST. MILD CAD WITH NL LV FUNCTION. MILD HYPERTENSION. FAMILY HX OF CAD, REMOTE SMOKER. PAST HX OF PAF.   CAROTID DUPLEX Bilateral 03/13/01   SM AMT OF NON HEMODYNAMICALLY SIGN PLAQUE IN THE RIGHT CAROTID BULB, <39% STENOSIS.   COLONOSCOPY  10/20/2011   Procedure: COLONOSCOPY;  Surgeon: Rogene Houston, MD;  Location: [facility];  Service: Endoscopy;  Laterality: N/A;   CRANIOTOMY N/A 02/15/2013   Procedure: CRANIOTOMY HEMATOMA EVACUATION SUBDURAL;  Surgeon: Hosie Spangle, MD;  Location: [place] [facility];  Service: Neurosurgery;  Laterality: N/A;   DOPPLER ECHOCARDIOGRAPHY N/A 02/01/12   LV CAVITY SIZE IS NORMAL. MILD CONCENTRIC HYPERTROPHY. EF 55-60%. PARAMETERS ARE CONSISTANT WITH ABNORMAL LV RELAXATION.(GRADE 1 DIASTOLIC DYSFUNCTION). MR. LEFT ATRIUM MODERATELY DILATED. RIGHT ATRIUM MILDLY DILATED. ATRIAL SEPTUM  NORMAL. ATRIAL PACED RHYTHM WITH INTACT A-V CONDUCTION.   enrhythm generator     implant of a medtronic generator   interrogation     atrial and ventricular   NUCLEAR STRESS TEST N/A 03/18/09   NORMAL PATTERN OF PERFUSION IN ALL REGIONS. LEFT VENTRICULAR SIZE IS NORMAL. NO EVID OF INDUCIBLE ISCHEMIA. EF 77%. NORMAL MYOCARDIAL PERFUSION STUDY.   PACEMAKER GENERATOR CHANGE N/A 07/24/2013   Procedure: PACEMAKER GENERATOR CHANGE;  Surgeon: Sanda Klein, MD;  Location: [facility];  Service: Cardiovascular;  Laterality: N/A;   PULSE GENERATOR IMPLANT       Current Meds  Medication Sig   amiodarone (PACERONE) 200 MG tablet Take 1 tablet (200 mg total) by mouth daily. Patient needs to schedule appointment  For further refills   cephALEXin (KEFLEX) 500 MG capsule every other day.    donepezil (ARICEPT) 10 MG tablet Take 10 mg by mouth daily.    latanoprost (XALATAN) 0.005 % ophthalmic solution Place 1 drop into the right eye at bedtime.   levothyroxine (SYNTHROID, LEVOTHROID) 150 MCG tablet    nitroGLYCERIN (NITROSTAT) 0.4 MG SL tablet Place 0.4 mg under the tongue every 5 (five) minutes as needed. For chest pain.   OLANZapine (ZYPREXA) 10 MG tablet Take 10 mg by mouth daily.    [DISCONTINUED] levothyroxine (SYNTHROID, LEVOTHROID) 88 MCG tablet Take 1 tablet by mouth daily.     Allergies:   Patient has no known allergies.   Social History   Tobacco Use   Smoking status: Never Smoker   Smokeless tobacco: Never Used  Substance Use Topics   Alcohol use: No   Drug use: No     Family Hx: The patient's family history includes Cancer in his mother and sister; Heart disease in his father.  ROS:   Please see the history of present illness.    Review of systems is very limited due to dementia. All other systems reviewed and are negative.   Prior CV studies:   The following studies were reviewed today:  Labs/Other Tests and Data Reviewed:    EKG:  An ECG dated  08/27/2016 was personally reviewed today and demonstrated:  Almost entirely atrial paced, ventricular paced rhythm with a single atrial paced, ventricular sensed beat  Recent Labs: No results found for requested labs within last 8760 hours.   Recent Lipid Panel No results found for: CHOL, TRIG, HDL, CHOLHDL, LDLCALC, LDLDIRECT   Labs via [facility] from November 2019 total cholesterol 168, HDL 47, LDL 94, triglycerides 134, creatinine 1.4  Wt Readings from Last 3 Encounters:  08/27/16 127 lb (57.6 kg)  03/04/15 163 lb (73.9 kg)  06/26/14 161 lb (73 kg)     Objective:    Vital Signs:  Ht 5\' 6"  (1.676 m)    BMI 20.50 kg/m    Well nourished, well developed male in no acute distress. Lying in bed, not participating in the discussion  ASSESSMENT & PLAN:    1. AFib: Uncertain  burden of arrhythmia.  Will review his pacemaker download which hopefully will be available later today.  This does not appear to be causing any symptoms.  He is unable to take anticoagulation due to recurrent subdural hematomas.  He was unable to tolerate conventional AV blocking agents due to hypotension and frequent falls so he is on amiodarone 2. SSS: Status post dual-chamber permanent pacemaker implantation.  Does not have any symptoms that would suggest pacemaker dysfunction. 3. PM: Device has not been checked since August 2018 (when we performed multiple reprogramming changes to allow more native AV conduction and turned off atrial therapies that were ineffective). We need to restart a scheduled download every 3 months.  His daughter states that she will do a download today.  He is not pacemaker dependent (or at least was not dependent when last checked).  Based on data from August 2018 he should still have roughly 2 years of remaining battery.  Had a discussion with his daughter, who has medical power of attorney, regarding the decision to perform a generator change out when the time comes up.  Obviously will depend on the  progression of his health between now and then. 4. Amiodarone: No overt side effect from this medication but he needs updated thyroid and liver function tests.  These have also not been checked since August 2018 (or at least I cannot find any records of them being checked elsewhere). 5. Hypothyroidism acquired: It is particularly important to recheck his TSH while he takes amiodarone.  COVID-19 Education: The signs and symptoms of COVID-19 were discussed with the patient and how to seek care for testing (follow up with PCP or arrange E-visit).  The importance of social distancing was discussed today.  Time:   Today, I have spent 22 minutes with the patient with telehealth technology discussing the above problems.     Medication Adjustments/Labs and Tests Ordered: Current medicines are reviewed at length with the patient today.  Concerns regarding medicines are outlined above.  Tests Ordered: No orders of the defined types were placed in this encounter.  Medication Changes: No orders of the defined types were placed in this encounter.   Disposition:  Follow up Pacemaker download today and every 3 months, office visit 12 months.  Signed, Sanda Klein, MD  04/14/2018 8:25 AM     [facility]

## 2018-04-15 LAB — CUP PACEART REMOTE DEVICE CHECK
Battery Remaining Longevity: 40 mo
Battery Voltage: 2.97 V
Brady Statistic AP VP Percent: 47.1 %
Brady Statistic AP VS Percent: 44.01 %
Brady Statistic AS VP Percent: 5.44 %
Brady Statistic AS VS Percent: 3.45 %
Brady Statistic RA Percent Paced: 89.69 %
Brady Statistic RV Percent Paced: 52.91 %
Date Time Interrogation Session: 20200410155047
Implantable Lead Implant Date: 20010315
Implantable Lead Implant Date: 20010315
Implantable Lead Location: 753859
Implantable Lead Location: 753860
Implantable Lead Model: 4592
Implantable Lead Model: 5092
Implantable Pulse Generator Implant Date: 20150721
Lead Channel Impedance Value: 228 Ohm
Lead Channel Impedance Value: 285 Ohm
Lead Channel Impedance Value: 418 Ohm
Lead Channel Impedance Value: 456 Ohm
Lead Channel Pacing Threshold Amplitude: 0.5 V
Lead Channel Pacing Threshold Amplitude: 0.625 V
Lead Channel Pacing Threshold Pulse Width: 0.4 ms
Lead Channel Pacing Threshold Pulse Width: 0.4 ms
Lead Channel Sensing Intrinsic Amplitude: 1.875 mV
Lead Channel Sensing Intrinsic Amplitude: 1.875 mV
Lead Channel Sensing Intrinsic Amplitude: 2.75 mV
Lead Channel Sensing Intrinsic Amplitude: 2.75 mV
Lead Channel Setting Pacing Amplitude: 2 V
Lead Channel Setting Pacing Amplitude: 2 V
Lead Channel Setting Pacing Pulse Width: 0.4 ms
Lead Channel Setting Sensing Sensitivity: 2 mV

## 2018-04-21 NOTE — Progress Notes (Signed)
Remote pacemaker transmission.   

## 2018-05-18 DIAGNOSIS — H35372 Puckering of macula, left eye: Secondary | ICD-10-CM | POA: Diagnosis not present

## 2018-05-18 DIAGNOSIS — H35371 Puckering of macula, right eye: Secondary | ICD-10-CM | POA: Diagnosis not present

## 2018-05-18 DIAGNOSIS — H353131 Nonexudative age-related macular degeneration, bilateral, early dry stage: Secondary | ICD-10-CM | POA: Diagnosis not present

## 2018-05-18 DIAGNOSIS — H34831 Tributary (branch) retinal vein occlusion, right eye, with macular edema: Secondary | ICD-10-CM | POA: Diagnosis not present

## 2018-05-22 ENCOUNTER — Other Ambulatory Visit: Payer: Self-pay | Admitting: Cardiovascular Disease

## 2018-06-05 DIAGNOSIS — E038 Other specified hypothyroidism: Secondary | ICD-10-CM | POA: Diagnosis not present

## 2018-06-05 DIAGNOSIS — Z0000 Encounter for general adult medical examination without abnormal findings: Secondary | ICD-10-CM | POA: Diagnosis not present

## 2018-06-05 DIAGNOSIS — E7849 Other hyperlipidemia: Secondary | ICD-10-CM | POA: Diagnosis not present

## 2018-06-05 DIAGNOSIS — N182 Chronic kidney disease, stage 2 (mild): Secondary | ICD-10-CM | POA: Diagnosis not present

## 2018-07-11 ENCOUNTER — Other Ambulatory Visit: Payer: Self-pay

## 2018-07-11 NOTE — Patient Outreach (Signed)
[place] [facility]) Care Management  07/11/2018  DARIEN KADING Jul 25, 1924 [id number]    Unsuccessful outreach attempt. Left HIPAA compliant voice message requesting a return call.   PLAN -Will follow-up within 3-4 business days.   [place] [phone]

## 2018-07-13 ENCOUNTER — Other Ambulatory Visit: Payer: Self-pay | Admitting: Cardiovascular Disease

## 2018-07-14 ENCOUNTER — Telehealth: Payer: Self-pay

## 2018-07-14 ENCOUNTER — Ambulatory Visit (INDEPENDENT_AMBULATORY_CARE_PROVIDER_SITE_OTHER): Payer: Medicare Other | Admitting: *Deleted

## 2018-07-14 DIAGNOSIS — R001 Bradycardia, unspecified: Secondary | ICD-10-CM

## 2018-07-14 NOTE — Telephone Encounter (Signed)
Left message for patient to remind of missed remote transmission.  

## 2018-07-17 ENCOUNTER — Other Ambulatory Visit: Payer: Self-pay

## 2018-07-17 LAB — CUP PACEART REMOTE DEVICE CHECK
Battery Remaining Longevity: 35 mo
Battery Voltage: 2.96 V
Brady Statistic AP VP Percent: 65.94 %
Brady Statistic AP VS Percent: 26.27 %
Brady Statistic AS VP Percent: 6.76 %
Brady Statistic AS VS Percent: 1.03 %
Brady Statistic RA Percent Paced: 90.46 %
Brady Statistic RV Percent Paced: 72.45 %
Date Time Interrogation Session: 20200712000731
Implantable Lead Implant Date: 20010315
Implantable Lead Implant Date: 20010315
Implantable Lead Location: 753859
Implantable Lead Location: 753860
Implantable Lead Model: 4592
Implantable Lead Model: 5092
Implantable Pulse Generator Implant Date: 20150721
Lead Channel Impedance Value: 209 Ohm
Lead Channel Impedance Value: 266 Ohm
Lead Channel Impedance Value: 399 Ohm
Lead Channel Impedance Value: 437 Ohm
Lead Channel Pacing Threshold Amplitude: 0.5 V
Lead Channel Pacing Threshold Amplitude: 0.625 V
Lead Channel Pacing Threshold Pulse Width: 0.4 ms
Lead Channel Pacing Threshold Pulse Width: 0.4 ms
Lead Channel Sensing Intrinsic Amplitude: 1.625 mV
Lead Channel Sensing Intrinsic Amplitude: 1.625 mV
Lead Channel Sensing Intrinsic Amplitude: 2.375 mV
Lead Channel Sensing Intrinsic Amplitude: 2.375 mV
Lead Channel Setting Pacing Amplitude: 2 V
Lead Channel Setting Pacing Amplitude: 2 V
Lead Channel Setting Pacing Pulse Width: 0.4 ms
Lead Channel Setting Sensing Sensitivity: 2 mV

## 2018-07-17 NOTE — Progress Notes (Signed)
Remote pacemaker transmission.   

## 2018-07-17 NOTE — Patient Outreach (Signed)
[place] [facility]) Care Management  07/17/2018  EDWORD CU 08-Feb-1924 [id number]   Unsuccessful outreach attempt. Left voice message requesting a return call.   PLAN -Will attempt outreach within 3-4 business days.   [place] [phone]

## 2018-07-21 ENCOUNTER — Other Ambulatory Visit: Payer: Self-pay

## 2018-07-21 NOTE — Patient Outreach (Signed)
[place] [facility]) Care Management  07/21/2018  LUISDAVID HAMBLIN [birth year] [id number]   2nd Attempted Outreach. Unsuccessful outreach attempt. Will follow-up within 3-4 business days.    [place] [phone]

## 2018-07-26 ENCOUNTER — Other Ambulatory Visit: Payer: Self-pay

## 2018-07-26 NOTE — Patient Outreach (Signed)
[place] [facility]) Care Management  07/26/2018  Reginald [name] [birth year] [id number]    Unable to contact [name].  No response to unsuccessful outreach letter. Initially attempted outreach on 07/11/18.    PLAN -Will complete case closure.   [place] [phone]

## 2018-08-15 ENCOUNTER — Other Ambulatory Visit: Payer: Self-pay

## 2018-08-28 ENCOUNTER — Other Ambulatory Visit: Payer: Self-pay | Admitting: Cardiovascular Disease

## 2018-09-07 DIAGNOSIS — N182 Chronic kidney disease, stage 2 (mild): Secondary | ICD-10-CM | POA: Diagnosis not present

## 2018-09-07 DIAGNOSIS — E038 Other specified hypothyroidism: Secondary | ICD-10-CM | POA: Diagnosis not present

## 2018-09-07 DIAGNOSIS — E7849 Other hyperlipidemia: Secondary | ICD-10-CM | POA: Diagnosis not present

## 2018-10-16 ENCOUNTER — Encounter: Payer: Medicare Other | Admitting: *Deleted

## 2018-10-23 ENCOUNTER — Other Ambulatory Visit: Payer: Self-pay | Admitting: Cardiovascular Disease

## 2018-12-11 ENCOUNTER — Other Ambulatory Visit: Payer: Self-pay | Admitting: Cardiovascular Disease

## 2018-12-18 DIAGNOSIS — E039 Hypothyroidism, unspecified: Secondary | ICD-10-CM | POA: Diagnosis not present

## 2018-12-18 DIAGNOSIS — N179 Acute kidney failure, unspecified: Secondary | ICD-10-CM | POA: Diagnosis not present

## 2018-12-18 DIAGNOSIS — D696 Thrombocytopenia, unspecified: Secondary | ICD-10-CM | POA: Diagnosis not present

## 2018-12-18 DIAGNOSIS — U071 COVID-19: Secondary | ICD-10-CM | POA: Diagnosis not present

## 2018-12-18 DIAGNOSIS — L89629 Pressure ulcer of left heel, unspecified stage: Secondary | ICD-10-CM | POA: Diagnosis not present

## 2018-12-18 DIAGNOSIS — Z87891 Personal history of nicotine dependence: Secondary | ICD-10-CM | POA: Diagnosis not present

## 2018-12-18 DIAGNOSIS — E860 Dehydration: Secondary | ICD-10-CM | POA: Diagnosis not present

## 2018-12-18 DIAGNOSIS — E870 Hyperosmolality and hypernatremia: Secondary | ICD-10-CM | POA: Diagnosis not present

## 2018-12-18 DIAGNOSIS — Z66 Do not resuscitate: Secondary | ICD-10-CM | POA: Diagnosis not present

## 2018-12-18 DIAGNOSIS — E871 Hypo-osmolality and hyponatremia: Secondary | ICD-10-CM | POA: Diagnosis not present

## 2018-12-18 DIAGNOSIS — G309 Alzheimer's disease, unspecified: Secondary | ICD-10-CM | POA: Diagnosis not present

## 2018-12-18 DIAGNOSIS — N170 Acute kidney failure with tubular necrosis: Secondary | ICD-10-CM | POA: Diagnosis not present

## 2018-12-18 DIAGNOSIS — Z1159 Encounter for screening for other viral diseases: Secondary | ICD-10-CM | POA: Diagnosis not present

## 2018-12-18 DIAGNOSIS — I482 Chronic atrial fibrillation, unspecified: Secondary | ICD-10-CM | POA: Diagnosis not present

## 2018-12-18 DIAGNOSIS — L89619 Pressure ulcer of right heel, unspecified stage: Secondary | ICD-10-CM | POA: Diagnosis not present

## 2018-12-18 DIAGNOSIS — H409 Unspecified glaucoma: Secondary | ICD-10-CM | POA: Diagnosis not present

## 2018-12-18 DIAGNOSIS — R918 Other nonspecific abnormal finding of lung field: Secondary | ICD-10-CM | POA: Diagnosis not present

## 2018-12-18 DIAGNOSIS — E440 Moderate protein-calorie malnutrition: Secondary | ICD-10-CM | POA: Diagnosis not present

## 2018-12-18 DIAGNOSIS — Z950 Presence of cardiac pacemaker: Secondary | ICD-10-CM | POA: Diagnosis not present

## 2018-12-18 DIAGNOSIS — Z23 Encounter for immunization: Secondary | ICD-10-CM | POA: Diagnosis not present

## 2018-12-18 DIAGNOSIS — E785 Hyperlipidemia, unspecified: Secondary | ICD-10-CM | POA: Diagnosis not present

## 2018-12-18 DIAGNOSIS — Z515 Encounter for palliative care: Secondary | ICD-10-CM | POA: Diagnosis not present

## 2018-12-22 DIAGNOSIS — Z23 Encounter for immunization: Secondary | ICD-10-CM | POA: Diagnosis not present

## 2019-01-05 DEATH — deceased
